# Patient Record
Sex: Female | Born: 1950 | Race: White | Hispanic: No | Marital: Married | Smoking: Former smoker
Health system: Southern US, Community
[De-identification: ages and names within clinical notes are randomized; demographics above are authoritative.]

## PROBLEM LIST (undated history)

## (undated) DIAGNOSIS — T7840XA Allergy, unspecified, initial encounter: Secondary | ICD-10-CM

## (undated) DIAGNOSIS — M949 Disorder of cartilage, unspecified: Secondary | ICD-10-CM

## (undated) DIAGNOSIS — Z8619 Personal history of other infectious and parasitic diseases: Secondary | ICD-10-CM

## (undated) DIAGNOSIS — Z974 Presence of external hearing-aid: Secondary | ICD-10-CM

## (undated) DIAGNOSIS — F17200 Nicotine dependence, unspecified, uncomplicated: Secondary | ICD-10-CM

## (undated) DIAGNOSIS — K219 Gastro-esophageal reflux disease without esophagitis: Secondary | ICD-10-CM

## (undated) DIAGNOSIS — R55 Syncope and collapse: Secondary | ICD-10-CM

## (undated) DIAGNOSIS — L659 Nonscarring hair loss, unspecified: Secondary | ICD-10-CM

## (undated) DIAGNOSIS — J45909 Unspecified asthma, uncomplicated: Secondary | ICD-10-CM

## (undated) DIAGNOSIS — G40802 Other epilepsy, not intractable, without status epilepticus: Secondary | ICD-10-CM

## (undated) DIAGNOSIS — K759 Inflammatory liver disease, unspecified: Secondary | ICD-10-CM

## (undated) DIAGNOSIS — I10 Essential (primary) hypertension: Secondary | ICD-10-CM

## (undated) DIAGNOSIS — H9190 Unspecified hearing loss, unspecified ear: Secondary | ICD-10-CM

## (undated) DIAGNOSIS — B269 Mumps without complication: Secondary | ICD-10-CM

## (undated) DIAGNOSIS — R7309 Other abnormal glucose: Secondary | ICD-10-CM

## (undated) DIAGNOSIS — G4700 Insomnia, unspecified: Secondary | ICD-10-CM

## (undated) DIAGNOSIS — T753XXA Motion sickness, initial encounter: Secondary | ICD-10-CM

## (undated) DIAGNOSIS — R002 Palpitations: Secondary | ICD-10-CM

## (undated) DIAGNOSIS — J309 Allergic rhinitis, unspecified: Secondary | ICD-10-CM

## (undated) DIAGNOSIS — F3289 Other specified depressive episodes: Secondary | ICD-10-CM

## (undated) DIAGNOSIS — B059 Measles without complication: Secondary | ICD-10-CM

## (undated) DIAGNOSIS — C801 Malignant (primary) neoplasm, unspecified: Secondary | ICD-10-CM

## (undated) DIAGNOSIS — M899 Disorder of bone, unspecified: Secondary | ICD-10-CM

## (undated) DIAGNOSIS — M1990 Unspecified osteoarthritis, unspecified site: Secondary | ICD-10-CM

## (undated) DIAGNOSIS — B019 Varicella without complication: Secondary | ICD-10-CM

## (undated) DIAGNOSIS — L299 Pruritus, unspecified: Secondary | ICD-10-CM

## (undated) DIAGNOSIS — D649 Anemia, unspecified: Secondary | ICD-10-CM

## (undated) DIAGNOSIS — F329 Major depressive disorder, single episode, unspecified: Secondary | ICD-10-CM

## (undated) HISTORY — DX: Unspecified asthma, uncomplicated: J45.909

## (undated) HISTORY — DX: Nicotine dependence, unspecified, uncomplicated: F17.200

## (undated) HISTORY — PX: TUBAL LIGATION: SHX77

## (undated) HISTORY — DX: Personal history of other infectious and parasitic diseases: Z86.19

## (undated) HISTORY — DX: Pruritus, unspecified: L29.9

## (undated) HISTORY — DX: Other abnormal glucose: R73.09

## (undated) HISTORY — DX: Disorder of cartilage, unspecified: M94.9

## (undated) HISTORY — DX: Other epilepsy, not intractable, without status epilepticus: G40.802

## (undated) HISTORY — DX: Palpitations: R00.2

## (undated) HISTORY — DX: Insomnia, unspecified: G47.00

## (undated) HISTORY — DX: Unspecified osteoarthritis, unspecified site: M19.90

## (undated) HISTORY — DX: Gastro-esophageal reflux disease without esophagitis: K21.9

## (undated) HISTORY — PX: COSMETIC SURGERY: SHX468

## (undated) HISTORY — DX: Unspecified hearing loss, unspecified ear: H91.90

## (undated) HISTORY — PX: BUNIONECTOMY: SHX129

## (undated) HISTORY — DX: Measles without complication: B05.9

## (undated) HISTORY — DX: Nonscarring hair loss, unspecified: L65.9

## (undated) HISTORY — DX: Major depressive disorder, single episode, unspecified: F32.9

## (undated) HISTORY — DX: Essential (primary) hypertension: I10

## (undated) HISTORY — DX: Syncope and collapse: R55

## (undated) HISTORY — PX: OTHER SURGICAL HISTORY: SHX169

## (undated) HISTORY — DX: Varicella without complication: B01.9

## (undated) HISTORY — DX: Disorder of bone, unspecified: M89.9

## (undated) HISTORY — DX: Allergic rhinitis, unspecified: J30.9

## (undated) HISTORY — DX: Inflammatory liver disease, unspecified: K75.9

## (undated) HISTORY — DX: Mumps without complication: B26.9

## (undated) HISTORY — DX: Other specified depressive episodes: F32.89

## (undated) HISTORY — PX: TONSILLECTOMY AND ADENOIDECTOMY: SUR1326

## (undated) HISTORY — DX: Allergy, unspecified, initial encounter: T78.40XA

---

## 1993-04-20 HISTORY — PX: OTHER SURGICAL HISTORY: SHX169

## 1995-04-21 HISTORY — PX: VAGINAL HYSTERECTOMY: SUR661

## 2001-05-30 ENCOUNTER — Other Ambulatory Visit: Admission: RE | Admit: 2001-05-30 | Discharge: 2001-05-30 | Payer: Self-pay | Admitting: Obstetrics and Gynecology

## 2004-04-20 HISTORY — PX: OTHER SURGICAL HISTORY: SHX169

## 2004-04-20 LAB — HM COLONOSCOPY: HM Colonoscopy: NORMAL

## 2004-08-06 ENCOUNTER — Ambulatory Visit: Payer: Self-pay | Admitting: Otolaryngology

## 2004-10-07 ENCOUNTER — Ambulatory Visit: Payer: Self-pay

## 2005-03-30 ENCOUNTER — Ambulatory Visit: Payer: Self-pay | Admitting: Gastroenterology

## 2005-03-30 HISTORY — PX: ESOPHAGOGASTRODUODENOSCOPY: SHX1529

## 2005-09-24 ENCOUNTER — Ambulatory Visit: Payer: Self-pay

## 2005-09-25 ENCOUNTER — Emergency Department: Payer: Self-pay | Admitting: General Practice

## 2005-09-25 ENCOUNTER — Other Ambulatory Visit: Payer: Self-pay

## 2005-12-25 ENCOUNTER — Ambulatory Visit: Payer: Self-pay | Admitting: Obstetrics and Gynecology

## 2006-09-01 ENCOUNTER — Ambulatory Visit: Payer: Self-pay | Admitting: Gastroenterology

## 2006-10-12 ENCOUNTER — Ambulatory Visit: Payer: Self-pay

## 2006-11-10 ENCOUNTER — Ambulatory Visit: Payer: Self-pay | Admitting: Physician Assistant

## 2007-02-28 ENCOUNTER — Ambulatory Visit: Payer: Self-pay | Admitting: Family Medicine

## 2007-10-13 ENCOUNTER — Ambulatory Visit: Payer: Self-pay

## 2007-12-09 ENCOUNTER — Ambulatory Visit: Payer: Self-pay | Admitting: Family Medicine

## 2008-01-11 ENCOUNTER — Ambulatory Visit: Payer: Self-pay | Admitting: Unknown Physician Specialty

## 2008-03-01 ENCOUNTER — Ambulatory Visit: Payer: Self-pay | Admitting: Family Medicine

## 2008-03-26 ENCOUNTER — Ambulatory Visit: Payer: Self-pay | Admitting: Family Medicine

## 2008-10-23 ENCOUNTER — Ambulatory Visit: Payer: Self-pay | Admitting: Family Medicine

## 2008-12-12 ENCOUNTER — Ambulatory Visit: Payer: Self-pay | Admitting: Family Medicine

## 2009-03-19 ENCOUNTER — Ambulatory Visit: Payer: Self-pay | Admitting: Family Medicine

## 2009-05-21 ENCOUNTER — Ambulatory Visit: Payer: Self-pay | Admitting: Family Medicine

## 2009-05-31 ENCOUNTER — Ambulatory Visit: Payer: Self-pay | Admitting: Family Medicine

## 2009-10-24 ENCOUNTER — Ambulatory Visit: Payer: Self-pay | Admitting: Family Medicine

## 2010-03-04 ENCOUNTER — Other Ambulatory Visit: Payer: Self-pay | Admitting: Family Medicine

## 2010-08-18 LAB — HM PAP SMEAR

## 2010-08-27 ENCOUNTER — Ambulatory Visit: Payer: Self-pay | Admitting: Family Medicine

## 2010-08-28 ENCOUNTER — Ambulatory Visit: Payer: Self-pay | Admitting: Family Medicine

## 2010-10-03 ENCOUNTER — Ambulatory Visit: Payer: Self-pay | Admitting: Orthopedic Surgery

## 2010-10-07 ENCOUNTER — Other Ambulatory Visit: Payer: Self-pay | Admitting: Family Medicine

## 2010-11-13 ENCOUNTER — Ambulatory Visit: Payer: Self-pay | Admitting: Family Medicine

## 2011-09-30 ENCOUNTER — Other Ambulatory Visit: Payer: Self-pay | Admitting: Family Medicine

## 2011-09-30 LAB — CBC WITH DIFFERENTIAL/PLATELET
Basophil %: 0.7 %
Eosinophil %: 1.5 %
HCT: 39.9 % (ref 35.0–47.0)
HGB: 13.5 g/dL (ref 12.0–16.0)
Lymphocyte #: 2.6 10*3/uL (ref 1.0–3.6)
Lymphocyte %: 28.7 %
MCH: 31.9 pg (ref 26.0–34.0)
MCHC: 33.8 g/dL (ref 32.0–36.0)
Monocyte #: 0.6 x10 3/mm (ref 0.2–0.9)
Monocyte %: 6.3 %
Neutrophil #: 5.6 10*3/uL (ref 1.4–6.5)
Neutrophil %: 62.8 %
RBC: 4.23 10*6/uL (ref 3.80–5.20)
RDW: 12.7 % (ref 11.5–14.5)
WBC: 8.9 10*3/uL (ref 3.6–11.0)

## 2011-09-30 LAB — LIPID PANEL
Cholesterol: 158 mg/dL (ref 0–200)
Triglycerides: 117 mg/dL (ref 0–200)
VLDL Cholesterol, Calc: 23 mg/dL (ref 5–40)

## 2011-09-30 LAB — COMPREHENSIVE METABOLIC PANEL
Anion Gap: 7 (ref 7–16)
BUN: 16 mg/dL (ref 7–18)
Bilirubin,Total: 0.5 mg/dL (ref 0.2–1.0)
Chloride: 103 mmol/L (ref 98–107)
Creatinine: 0.65 mg/dL (ref 0.60–1.30)
Osmolality: 277 (ref 275–301)
Potassium: 3.5 mmol/L (ref 3.5–5.1)
SGOT(AST): 27 U/L (ref 15–37)
SGPT (ALT): 34 U/L
Total Protein: 8 g/dL (ref 6.4–8.2)

## 2011-09-30 LAB — FOLATE: Folic Acid: 16.1 ng/mL (ref 3.1–100.0)

## 2011-09-30 LAB — HEMOGLOBIN A1C: Hemoglobin A1C: 5.7 % (ref 4.2–6.3)

## 2011-11-25 ENCOUNTER — Ambulatory Visit: Payer: Self-pay | Admitting: Family Medicine

## 2011-11-25 LAB — HM MAMMOGRAPHY: HM Mammogram: NEGATIVE

## 2012-01-04 ENCOUNTER — Encounter: Payer: Self-pay | Admitting: Sports Medicine

## 2012-01-04 ENCOUNTER — Telehealth: Payer: Self-pay

## 2012-01-04 NOTE — Telephone Encounter (Signed)
PT STATES SHE HAVE AN APPT WITH DR Katrinka Blazing ON October, BUT WOULD LIKE TO SPEAK WITH DR [facility] NURSE AHEAD OF TIME PLEASE CALL [phone]

## 2012-01-05 NOTE — Telephone Encounter (Signed)
Patient has appt scheduled she had insurance physical done and her labs were fine, except her CEA (carcinoembryonic antigen) was high, the level was 4.2.(normal range 0-2.5) She wants to know if this is something she needs to be concerned about now, or if she can discuss with Dr Katrinka Blazing when she comes in October. She has seen Dr Katrinka Blazing for years.

## 2012-01-06 NOTE — Telephone Encounter (Signed)
Please return call --- can wait to discuss CEA levels at visit in October.  KMS

## 2012-01-06 NOTE — Telephone Encounter (Signed)
Gave pt message from Dr Katrinka Blazing and pt agreed to D/W Dr Katrinka Blazing in Oct.

## 2012-01-19 ENCOUNTER — Encounter: Payer: Self-pay | Admitting: Sports Medicine

## 2012-02-08 ENCOUNTER — Encounter: Payer: Self-pay | Admitting: *Deleted

## 2012-02-09 ENCOUNTER — Encounter: Payer: Self-pay | Admitting: Family Medicine

## 2012-02-15 ENCOUNTER — Ambulatory Visit (INDEPENDENT_AMBULATORY_CARE_PROVIDER_SITE_OTHER): Payer: PRIVATE HEALTH INSURANCE | Admitting: Family Medicine

## 2012-02-15 ENCOUNTER — Encounter: Payer: Self-pay | Admitting: Family Medicine

## 2012-02-15 VITALS — BP 142/80 | HR 69 | Temp 98.0°F | Resp 16 | Ht 62.5 in | Wt 172.0 lb

## 2012-02-15 DIAGNOSIS — J45909 Unspecified asthma, uncomplicated: Secondary | ICD-10-CM

## 2012-02-15 DIAGNOSIS — F418 Other specified anxiety disorders: Secondary | ICD-10-CM

## 2012-02-15 DIAGNOSIS — N952 Postmenopausal atrophic vaginitis: Secondary | ICD-10-CM

## 2012-02-15 DIAGNOSIS — E7800 Pure hypercholesterolemia, unspecified: Secondary | ICD-10-CM

## 2012-02-15 DIAGNOSIS — I10 Essential (primary) hypertension: Secondary | ICD-10-CM

## 2012-02-15 DIAGNOSIS — R970 Elevated carcinoembryonic antigen [CEA]: Secondary | ICD-10-CM

## 2012-02-15 DIAGNOSIS — R7309 Other abnormal glucose: Secondary | ICD-10-CM

## 2012-02-15 LAB — COMPREHENSIVE METABOLIC PANEL
ALT: 25 U/L (ref 0–35)
AST: 22 U/L (ref 0–37)
Albumin: 4.4 g/dL (ref 3.5–5.2)
Alkaline Phosphatase: 64 U/L (ref 39–117)
Calcium: 9.7 mg/dL (ref 8.4–10.5)
Chloride: 104 mEq/L (ref 96–112)
Potassium: 3.4 mEq/L — ABNORMAL LOW (ref 3.5–5.3)
Sodium: 139 mEq/L (ref 135–145)
Total Protein: 7.1 g/dL (ref 6.0–8.3)

## 2012-02-15 LAB — CK: Total CK: 215 U/L — ABNORMAL HIGH (ref 7–177)

## 2012-02-15 LAB — LIPID PANEL: LDL Cholesterol: 103 mg/dL — ABNORMAL HIGH (ref 0–99)

## 2012-02-15 LAB — CBC WITH DIFFERENTIAL/PLATELET
Basophils Absolute: 0 10*3/uL (ref 0.0–0.1)
Basophils Relative: 0 % (ref 0–1)
HCT: 39.1 % (ref 36.0–46.0)
Hemoglobin: 13.5 g/dL (ref 12.0–15.0)
Lymphocytes Relative: 28 % (ref 12–46)
MCHC: 34.5 g/dL (ref 30.0–36.0)
Neutro Abs: 6.7 10*3/uL (ref 1.7–7.7)
Neutrophils Relative %: 66 % (ref 43–77)
RDW: 12.5 % (ref 11.5–15.5)
WBC: 10.2 10*3/uL (ref 4.0–10.5)

## 2012-02-15 LAB — HEMOGLOBIN A1C
Hgb A1c MFr Bld: 5.9 % — ABNORMAL HIGH (ref <5.7)
Mean Plasma Glucose: 123 mg/dL — ABNORMAL HIGH (ref <117)

## 2012-02-15 NOTE — Patient Instructions (Addendum)
1. Essential hypertension, benign   2. Asthma   3. Elevated CEA   4. Atrophic vaginitis

## 2012-02-15 NOTE — Progress Notes (Signed)
[address]   [place], [place]  [address]   [phone]  Subjective:    Patient ID: Gina Harper, female    DOB: [birth year], 61 y.o.   MRN: [id number]  HPI This 60 y.o. female presents to establish care and for six month follow-up:  1.  HTN:  Gained six pounds in past six months.  Minimal exercise with recent move.  Eating out a lot.  Moved to [place]; has ellipitical, total gym, treadmill.   BP at work running 117-130/60-70s.  No chest pain, palpitations, shortness of breath, leg swelling.  2.  Depression/Anxiety: moved 12/2011.  Moving is very stressful. Husband drives to [facility].  Coping well.    3.  Elevated CEA:  Having problems with cervical spine.  Undergoing physical therapy.  Intermittent pain; Dr. Renaldo Reel.  S/p cervical spine xray.  Mother had benign tumor of spine. No family history of colon cancer; mother had bladder and lung cancer.  Colonoscopy 2006.  No bloody stools, melena, change in bowel habits, weight loss unintentional, night sweats, nausea, vomiting.  4.  Asthma: needs refill on Albuterol.    5. Hyperlipidemia:  Six month follow-up; due for repeat labs.  Has gained weight since last visit.  6.  Glucose intolerance: on labs six months ago; has gained six pounds since last visit.  Due for repeat labs.   Review of Systems  Constitutional: Negative for fever, chills, diaphoresis, activity change, appetite change and fatigue.  HENT: Negative for congestion, rhinorrhea and postnasal drip.   Respiratory: Positive for wheezing. Negative for cough, shortness of breath and stridor.   Cardiovascular: Negative for chest pain, palpitations and leg swelling.  Gastrointestinal: Negative for nausea, vomiting, abdominal pain, diarrhea, constipation, blood in stool, abdominal distention, anal bleeding and rectal pain.  Neurological: Negative for dizziness, tremors, seizures, syncope, facial asymmetry, speech difficulty, weakness, light-headedness, numbness and headaches.    Psychiatric/Behavioral: Negative for suicidal ideas, sleep disturbance, self-injury and dysphoric mood. The patient is nervous/anxious.         Past Medical History  Diagnosis Date  . Unspecified pruritic disorder   . Unspecified asthma   . Other abnormal glucose   . Disorder of bone and cartilage, unspecified   . Insomnia, unspecified   . Abdominal epilepsy     resctal spasm,syncope  . Esophageal reflux   . Alopecia, unspecified   . Depressive disorder, not elsewhere classified   . Disorder of bone and cartilage, unspecified   . Tobacco use disorder   . Unspecified essential hypertension   . Palpitations   . Unspecified hearing loss   . Allergic rhinitis, cause unspecified   . Acute bronchospasm   . Chicken pox   . Mumps   . Measles     red measles    Past Surgical History  Procedure Date  . Bunionectomy [id number]    bilateral  . Rhinoplasty with septoplasty   . Tubal ligation   . Bladder tact 1995  . Vaginal hysterectomy 1997    menorrhagia ovaries intact  . Tonsillectomy and adenoidectomy     Prior to Admission medications   Medication Sig Start Date End Date Taking? Authorizing Provider  albuterol (PROAIR HFA) 108 (90 BASE) MCG/ACT inhaler Inhale 2 puffs into the lungs every 6 (six) hours as needed.   Yes Historical Provider, MD  aspirin 81 MG tablet Take 81 mg by mouth daily.   Yes Historical Provider, MD  Calcium Carbonate-Vitamin D (CALCIUM 600+D) 600-400 MG-UNIT per tablet Take 1  tablet by mouth 2 (two) times daily.   Yes Historical Provider, MD  Cholecalciferol (VITAMIN D-3) 1000 UNITS CAPS Take by mouth daily.   Yes Historical Provider, MD  estradiol (ESTRACE) 0.1 MG/GM vaginal cream Place 1 g vaginally daily.   Yes Historical Provider, MD  FLUoxetine (PROZAC) 20 MG capsule Take 20 mg by mouth daily.   Yes Historical Provider, MD  hydrochlorothiazide (HYDRODIURIL) 25 MG tablet Take 25 mg by mouth daily.   Yes Historical Provider, MD  lactobacillus  acidophilus (BACID) TABS Take 1 tablet by mouth daily.   Yes Historical Provider, MD  Multiple Vitamin (MULTI-VITAMINS) TABS Take by mouth daily.   Yes Historical Provider, MD  promethazine (PHENERGAN) 25 MG suppository Place 25 mg rectally every 6 (six) hours as needed.   Yes Historical Provider, MD  promethazine (PHENERGAN) 25 MG tablet Take 25 mg by mouth every 6 (six) hours as needed.   Yes Historical Provider, MD  BIOTIN PO Take by mouth daily.    Historical Provider, MD  potassium chloride SA (K-DUR,KLOR-CON) 20 MEQ tablet Take 1 tablet (20 mEq total) by mouth daily. 02/19/12   Ethelda Chick, MD    Allergies  Allergen Reactions  . Augmentin (Amoxicillin-Pot Clavulanate) Rash    History   Social History  . Marital Status: Married    Spouse Name: N/A    Number of Children: 1  . Years of Education: college   Occupational History  . 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   Social History Main Topics  . Smoking status: Former Smoker -- 17 years    Types: Cigarettes  . Smokeless tobacco: Not on file     Comment: Quit 22 years ago  . Alcohol Use: Yes     Comment: moderate white wine 5 glasses per week  . Drug Use: No  . Sexually Active: Not on file   Other Topics Concern  . Not on file   Social History Narrative   Always uses seat belts. Smoke alarm and carbon monoxide detector in the home. Guns stored in locked cabinet. Caffeine use: Coffee, Tea, Carbonated beverages, 3 servings / day. Exercise: Treadmill; elliptical and Zumba about 3 - 4 times weekly. Married x 38 years, happily married no domestic abuse.    Family History  Problem Relation Age of Onset  . Cancer Mother     Lung, Bladder  . Stroke Father   . Heart disease Father   . Colon polyps Father     Objective:   Physical Exam  Nursing note and vitals reviewed. Constitutional: She is oriented to person, place, and time. She appears well-developed and well-nourished. No distress.  HENT:  Mouth/Throat:  Oropharynx is clear and moist.  Eyes: Conjunctivae normal are normal. Pupils are equal, round, and reactive to light.  Neck: Normal range of motion. Neck supple. No JVD present. No thyromegaly present.  Cardiovascular: Normal rate, regular rhythm, normal heart sounds and intact distal pulses.  Exam reveals no gallop and no friction rub.   No murmur heard. Pulmonary/Chest: Effort normal and breath sounds normal. She has no wheezes. She has no rales.  Abdominal: Soft. Bowel sounds are normal. She exhibits no distension and no mass. There is no tenderness. There is no rebound and no guarding.  Lymphadenopathy:    She has no cervical adenopathy.  Neurological: She is alert and oriented to person, place, and time. No cranial nerve deficit. She exhibits normal muscle tone. Coordination normal.  Skin: She is  not diaphoretic.  Psychiatric: She has a normal mood and affect. Her behavior is normal. Judgment and thought content normal.       anxious       Assessment & Plan:   1. Essential hypertension, benign  CBC with Differential, Comprehensive metabolic panel  2. Asthma    3. Elevated CEA  CK  4. Atrophic vaginitis    5. Pure hypercholesterolemia  Lipid panel  6. Other abnormal glucose  Hemoglobin A1c  7. Depression with anxiety   1.  HTN: controlled; no change in medications; obtain labs. 2.  Asthma: stable; refill Albuterol today. 3.  Elevated CEA: New.  Colonoscopy UTD in 2006; asymptomatic.  Do not feel warrants further evaluation due to lack of symptoms and colonoscopy UTD.  If anemic on labs, will warrant referral to GI.  Patient agreeable with plan. 4. Hypercholesterolemia: Uncontrolled; repeat labs today. 5. Glucose Intolerance: stable; repeat labs today.   6.  Anxiety and depression: worsening with recent move; no changes in medication at this time.  If worsens or persists, will recommend increase in Prozac dose.   Meds ordered this encounter  Medications  . aspirin 81 MG tablet     Sig: Take 81 mg by mouth daily.  . potassium chloride SA (K-DUR,KLOR-CON) 20 MEQ tablet    Sig: Take 1 tablet (20 mEq total) by mouth daily.    Dispense:  7 tablet    Refill:  0

## 2012-02-16 ENCOUNTER — Telehealth: Payer: Self-pay

## 2012-02-19 MED ORDER — POTASSIUM CHLORIDE CRYS ER 20 MEQ PO TBCR: 20.0000 meq | EXTENDED_RELEASE_TABLET | Freq: Every day | ORAL | Status: DC

## 2012-04-05 DIAGNOSIS — R970 Elevated carcinoembryonic antigen [CEA]: Secondary | ICD-10-CM | POA: Insufficient documentation

## 2012-04-05 DIAGNOSIS — N952 Postmenopausal atrophic vaginitis: Secondary | ICD-10-CM | POA: Insufficient documentation

## 2012-04-05 DIAGNOSIS — J4530 Mild persistent asthma, uncomplicated: Secondary | ICD-10-CM | POA: Insufficient documentation

## 2012-04-05 DIAGNOSIS — F321 Major depressive disorder, single episode, moderate: Secondary | ICD-10-CM | POA: Insufficient documentation

## 2012-04-05 DIAGNOSIS — F329 Major depressive disorder, single episode, unspecified: Secondary | ICD-10-CM | POA: Insufficient documentation

## 2012-04-05 DIAGNOSIS — J45990 Exercise induced bronchospasm: Secondary | ICD-10-CM | POA: Insufficient documentation

## 2012-04-05 DIAGNOSIS — I10 Essential (primary) hypertension: Secondary | ICD-10-CM | POA: Insufficient documentation

## 2012-04-05 DIAGNOSIS — E7800 Pure hypercholesterolemia, unspecified: Secondary | ICD-10-CM | POA: Insufficient documentation

## 2012-04-05 MED ORDER — ALBUTEROL SULFATE HFA 108 (90 BASE) MCG/ACT IN AERS: 2.0000 | INHALATION_SPRAY | Freq: Four times a day (QID) | RESPIRATORY_TRACT | Status: DC | PRN

## 2012-04-15 NOTE — Progress Notes (Signed)
Reviewed and agree.

## 2012-08-02 ENCOUNTER — Ambulatory Visit: Payer: Self-pay | Admitting: Neurosurgery

## 2012-08-15 ENCOUNTER — Ambulatory Visit (INDEPENDENT_AMBULATORY_CARE_PROVIDER_SITE_OTHER): Payer: 59 | Admitting: Family Medicine

## 2012-08-15 ENCOUNTER — Encounter: Payer: Self-pay | Admitting: Family Medicine

## 2012-08-15 VITALS — BP 132/78 | HR 71 | Temp 97.9°F | Resp 16 | Ht 63.0 in | Wt 169.0 lb

## 2012-08-15 DIAGNOSIS — Z01419 Encounter for gynecological examination (general) (routine) without abnormal findings: Secondary | ICD-10-CM | POA: Insufficient documentation

## 2012-08-15 DIAGNOSIS — F418 Other specified anxiety disorders: Secondary | ICD-10-CM

## 2012-08-15 DIAGNOSIS — K625 Hemorrhage of anus and rectum: Secondary | ICD-10-CM

## 2012-08-15 DIAGNOSIS — Z0000 Encounter for general adult medical examination without abnormal findings: Secondary | ICD-10-CM | POA: Insufficient documentation

## 2012-08-15 DIAGNOSIS — I10 Essential (primary) hypertension: Secondary | ICD-10-CM

## 2012-08-15 LAB — HEMOGLOBIN A1C: Hgb A1c MFr Bld: 5.8 % — ABNORMAL HIGH (ref <5.7)

## 2012-08-15 LAB — POCT URINALYSIS DIPSTICK
Bilirubin, UA: NEGATIVE
Blood, UA: NEGATIVE
Glucose, UA: NEGATIVE
Nitrite, UA: NEGATIVE
Urobilinogen, UA: 0.2

## 2012-08-15 LAB — COMPREHENSIVE METABOLIC PANEL
ALT: 28 U/L (ref 0–35)
Albumin: 4.6 g/dL (ref 3.5–5.2)
CO2: 28 mEq/L (ref 19–32)
Chloride: 102 mEq/L (ref 96–112)
Glucose, Bld: 114 mg/dL — ABNORMAL HIGH (ref 70–99)
Potassium: 3.9 mEq/L (ref 3.5–5.3)
Sodium: 138 mEq/L (ref 135–145)
Total Bilirubin: 0.5 mg/dL (ref 0.3–1.2)
Total Protein: 7 g/dL (ref 6.0–8.3)

## 2012-08-15 LAB — LIPID PANEL
LDL Cholesterol: 98 mg/dL (ref 0–99)
Triglycerides: 97 mg/dL (ref <150)
VLDL: 19 mg/dL (ref 0–40)

## 2012-08-15 LAB — CBC WITH DIFFERENTIAL/PLATELET
Hemoglobin: 13.6 g/dL (ref 12.0–15.0)
Lymphocytes Relative: 25 % (ref 12–46)
Lymphs Abs: 2.5 10*3/uL (ref 0.7–4.0)
MCH: 31.1 pg (ref 26.0–34.0)
Monocytes Relative: 5 % (ref 3–12)
Neutro Abs: 6.9 10*3/uL (ref 1.7–7.7)
Neutrophils Relative %: 68 % (ref 43–77)
Platelets: 243 10*3/uL (ref 150–400)
RBC: 4.38 MIL/uL (ref 3.87–5.11)
WBC: 10.1 10*3/uL (ref 4.0–10.5)

## 2012-08-15 LAB — VITAMIN B12: Vitamin B-12: 472 pg/mL (ref 211–911)

## 2012-08-15 LAB — IRON: Iron: 92 ug/dL (ref 42–145)

## 2012-08-15 LAB — POCT UA - MICROSCOPIC ONLY
Casts, Ur, LPF, POC: NEGATIVE
Yeast, UA: NEGATIVE

## 2012-08-15 LAB — FOLATE: Folate: 17.3 ng/mL

## 2012-08-15 LAB — TSH: TSH: 1.935 u[IU]/mL (ref 0.350–4.500)

## 2012-08-15 MED ORDER — PROMETHAZINE HCL 25 MG RE SUPP: 25.0000 mg | Freq: Four times a day (QID) | RECTAL | Status: DC | PRN

## 2012-08-15 MED ORDER — ALBUTEROL SULFATE HFA 108 (90 BASE) MCG/ACT IN AERS: 2.0000 | INHALATION_SPRAY | Freq: Four times a day (QID) | RESPIRATORY_TRACT | Status: DC | PRN

## 2012-08-15 MED ORDER — FLUOXETINE HCL 20 MG PO CAPS: 20.0000 mg | ORAL_CAPSULE | Freq: Every day | ORAL | Status: DC

## 2012-08-15 MED ORDER — HYDROCORTISONE ACETATE 25 MG RE SUPP: 25.0000 mg | Freq: Two times a day (BID) | RECTAL | Status: DC

## 2012-08-15 MED ORDER — HYDROCHLOROTHIAZIDE 25 MG PO TABS: 25.0000 mg | ORAL_TABLET | Freq: Every day | ORAL | Status: DC

## 2012-08-15 MED ORDER — OMEPRAZOLE 20 MG PO CPDR: 20.0000 mg | DELAYED_RELEASE_CAPSULE | Freq: Every day | ORAL | Status: DC

## 2012-08-15 MED ORDER — PROMETHAZINE HCL 25 MG PO TABS: 25.0000 mg | ORAL_TABLET | Freq: Four times a day (QID) | ORAL | Status: DC | PRN

## 2012-08-15 MED ORDER — ESTRADIOL 0.1 MG/GM VA CREA: TOPICAL_CREAM | VAGINAL | Status: DC

## 2012-08-15 NOTE — Progress Notes (Signed)
  Subjective:    Patient ID: Gina Harper, female    DOB: 1950-07-31, 62 y.o.   MRN: [id number]  HPI    Review of Systems  Constitutional: Positive for diaphoresis.  Gastrointestinal: Positive for anal bleeding.  Musculoskeletal: Positive for back pain and arthralgias.       Objective:   Physical Exam        Assessment & Plan:

## 2012-08-15 NOTE — Assessment & Plan Note (Signed)
Controlled; obtain labs; refills provided. 

## 2012-08-15 NOTE — Assessment & Plan Note (Signed)
New. Scant bleeding on toilet paper associated with itching; consistent with hemorrhoids. Treat with Anusol HC qhs for 1-2 weeks. If persists, refer to GI.

## 2012-08-15 NOTE — Assessment & Plan Note (Signed)
Anticipatory guidance. Discussed current pap smear guidelines in detail.  S/p pelvic and breast examinations in office.  Refer for mammogram.

## 2012-08-15 NOTE — Patient Instructions (Addendum)
1.  CALL INSURANCE REGARDING SHINGLES VACCINE COVERAGE (ZOSTAVAX).

## 2012-08-15 NOTE — Assessment & Plan Note (Signed)
Anticipatory guidance --- weight loss, exercise.  Pap smear UTD 2012.  Mammogram UTD; colonoscopy UTD; hemosure negative.  Immunizations --- advised to contact insurance regarding Zostavax coverage.  Obtain labs. Recommend next bone density scan age 62.

## 2012-08-15 NOTE — Assessment & Plan Note (Signed)
Stable; no change in management.  Refills provided.

## 2012-08-15 NOTE — Progress Notes (Signed)
[address]   [place], [place]  [address]   [phone]   Subjective:    Patient ID: Gina Harper, female    DOB: [birth year], 62 y.o.   MRN: [id number]  HPI This 62 y.o. female presents for CPE.  Last physical 08/20/2011. Pap smear 08/18/2010. Mammogram 11/25/11. [facility]. Colonoscopy 2006.  Repeat in 10 years.   Bone density scan three years ago.   TDAP 02/2009. Pneumovax never. Zostavax never.  History of shingles.   Flu vaccine 2013.  Eye exam this week; +glasses; no glaucoma or cataracts. Dental exam every six months.    Review of Systems  Constitutional: Positive for diaphoresis. Negative for fever, chills, activity change, appetite change, fatigue and unexpected weight change.  HENT: Negative for hearing loss, ear pain, nosebleeds, congestion, sore throat, facial swelling, rhinorrhea, sneezing, drooling, mouth sores, trouble swallowing, neck pain, neck stiffness, dental problem, voice change, postnasal drip, sinus pressure, tinnitus and ear discharge.   Eyes: Negative for photophobia, pain, discharge, redness, itching and visual disturbance.  Respiratory: Negative for apnea, cough, choking, chest tightness, shortness of breath, wheezing and stridor.   Cardiovascular: Negative for chest pain, palpitations and leg swelling.  Gastrointestinal: Positive for anal bleeding. Negative for nausea, vomiting, abdominal pain, diarrhea, constipation, blood in stool, abdominal distention and rectal pain.  Endocrine: Positive for heat intolerance. Negative for cold intolerance, polydipsia, polyphagia and polyuria.  Genitourinary: Negative for urgency, frequency, hematuria, flank pain, vaginal discharge, genital sores, vaginal pain and pelvic pain.  Musculoskeletal: Positive for back pain and arthralgias. Negative for myalgias, joint swelling and gait problem.  Skin: Negative for color change, pallor and rash.  Allergic/Immunologic: Negative for environmental allergies, food allergies and  immunocompromised state.  Neurological: Negative for dizziness, tremors, seizures, syncope, facial asymmetry, speech difficulty, weakness, light-headedness, numbness and headaches.  Hematological: Negative for adenopathy. Does not bruise/bleed easily.  Psychiatric/Behavioral: Negative for suicidal ideas, hallucinations, behavioral problems, confusion, sleep disturbance, self-injury, dysphoric mood, decreased concentration and agitation. The patient is not nervous/anxious and is not hyperactive.     Past Medical History  Diagnosis Date  . Unspecified pruritic disorder   . Unspecified asthma   . Other abnormal glucose   . Insomnia, unspecified   . Abdominal epilepsy     resctal spasm,syncope  . Esophageal reflux   . Alopecia, unspecified   . Depressive disorder, not elsewhere classified   . Disorder of bone and cartilage, unspecified   . Tobacco use disorder   . Unspecified essential hypertension   . Palpitations   . Unspecified hearing loss   . Allergic rhinitis, cause unspecified   . Chicken pox   . Mumps   . Measles     red measles  . Arthritis     Past Surgical History  Procedure Laterality Date  . Bunionectomy  [id number]    bilateral  . Rhinoplasty with septoplasty    . Tubal ligation    . Bladder tact  1995  . Vaginal hysterectomy  1997    menorrhagia ovaries intact  . Tonsillectomy and adenoidectomy    . Cosmetic surgery    . Colonoscopy  04/20/2004    no polyps; normal.  Repeat in 10 years.  [place].    Prior to Admission medications   Medication Sig Start Date End Date Taking? Authorizing Provider  albuterol (PROAIR HFA) 108 (90 BASE) MCG/ACT inhaler Inhale 2 puffs into the lungs every 6 (six) hours as needed. PLEASE PLACE ON HOLD FOR PATIENT. 04/05/12  Yes Ethelda Chick,  MD  BIOTIN PO Take by mouth daily.   Yes Historical Provider, MD  Calcium Carbonate-Vitamin D (CALCIUM 600+D) 600-400 MG-UNIT per tablet Take 1 tablet by mouth 2 (two) times daily.   Yes  Historical Provider, MD  celecoxib (CELEBREX) 200 MG capsule Take 200 mg by mouth 2 (two) times daily.   Yes Historical Provider, MD  Cholecalciferol (VITAMIN D-3) 1000 UNITS CAPS Take by mouth daily.   Yes Historical Provider, MD  estradiol (ESTRACE) 0.1 MG/GM vaginal cream Place 1 g vaginally daily.   Yes Historical Provider, MD  FLUoxetine (PROZAC) 20 MG capsule Take 20 mg by mouth daily.   Yes Historical Provider, MD  hydrochlorothiazide (HYDRODIURIL) 25 MG tablet Take 25 mg by mouth daily.   Yes Historical Provider, MD  lactobacillus acidophilus (BACID) TABS Take 1 tablet by mouth daily.   Yes Historical Provider, MD  Multiple Vitamin (MULTI-VITAMINS) TABS Take by mouth daily.   Yes Historical Provider, MD  promethazine (PHENERGAN) 25 MG suppository Place 25 mg rectally every 6 (six) hours as needed.   Yes Historical Provider, MD  aspirin 81 MG tablet Take 81 mg by mouth daily.    Historical Provider, MD  hydrocortisone (ANUSOL-HC) 25 MG suppository Place 1 suppository (25 mg total) rectally 2 (two) times daily. 08/15/12   Ethelda Chick, MD  omeprazole (PRILOSEC) 20 MG capsule Take 1 capsule (20 mg total) by mouth daily. 08/15/12   Ethelda Chick, MD  potassium chloride SA (K-DUR,KLOR-CON) 20 MEQ tablet Take 1 tablet (20 mEq total) by mouth daily. 02/19/12   Ethelda Chick, MD  promethazine (PHENERGAN) 25 MG tablet Take 25 mg by mouth every 6 (six) hours as needed.    Historical Provider, MD    Allergies  Allergen Reactions  . Meloxicam   . Omnipen (Ampicillin)   . Augmentin (Amoxicillin-Pot Clavulanate) Rash    History   Social History  . Marital Status: Married    Spouse Name: N/A    Number of Children: 1  . 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   Social History Main Topics  . Smoking status: Former Smoker -- 17 years    Types: Cigarettes  . Smokeless tobacco: Not on file     Comment: Quit 22 years ago  . Alcohol Use:  3.0 oz/week    6 drink(s) per week     Comment: moderate white wine 5 glasses per week  . Drug Use: No  . Sexually Active: Yes -- Female partner(s)    Birth Control/ Protection: Post-menopausal, Surgical   Other Topics Concern  . Not on file   Social History Narrative      Marital status:  Married x 38 years, happily married no domestic abuse.      Children:  1 child; 3 grandchildren      Employment:  [employer] x 21 years; happy.  Pre-admission testing.      Tobacco:  Previous smoker.      Alcohol:  2 glasses of wine three nights per week.      Drugs; none      Exercise:  Walks 2-3 times per week for 30 minutes.  Treadmill, elliptical, Zumba.      Seatbelt:  Always uses seat belts.       +Smoke alarm and carbon monoxide detector in the home.       Guns:  Guns stored in locked cabinet.  Caffeine use: Coffee, Tea, Carbonated beverages, 3 servings / day.                  Family History  Problem Relation Age of Onset  . Cancer Mother     Lung, Bladder  . Stroke Father 55    TIAs  . Heart disease Father 68    AMI x 2.  . Colon polyps Father   . Thyroid disease Sister        Objective:   Physical Exam  Nursing note and vitals reviewed. Constitutional: She is oriented to person, place, and time. She appears well-developed and well-nourished. No distress.  HENT:  Head: Normocephalic and atraumatic.  Right Ear: External ear normal.  Left Ear: External ear normal.  Nose: Nose normal.  Mouth/Throat: Oropharynx is clear and moist.  Eyes: Conjunctivae and EOM are normal. Pupils are equal, round, and reactive to light.  Neck: Normal range of motion. Neck supple. No thyromegaly present.  Cardiovascular: Normal rate, regular rhythm, normal heart sounds and intact distal pulses.  Exam reveals no gallop and no friction rub.   No murmur heard. Pulmonary/Chest: Effort normal and breath sounds normal. No respiratory distress. She has no wheezes. She has no rales.  Abdominal: Soft.  Bowel sounds are normal. She exhibits no distension and no mass. There is no tenderness. There is no rebound and no guarding.  Genitourinary: Rectum normal and vagina normal. No breast swelling, tenderness, discharge or bleeding. There is no rash, tenderness or lesion on the right labia. There is no rash, tenderness or lesion on the left labia. Right adnexum displays no mass, no tenderness and no fullness. Left adnexum displays no mass, no tenderness and no fullness.  Musculoskeletal:       Right shoulder: Normal.       Left shoulder: Normal.       Cervical back: Normal.  Lymphadenopathy:    She has no cervical adenopathy.  Neurological: She is alert and oriented to person, place, and time. No cranial nerve deficit. She exhibits normal muscle tone. Coordination normal.  Skin: She is not diaphoretic.  Diffuse sun related changes throughout torso, extremities.  Psychiatric: She has a normal mood and affect. Her behavior is normal. Judgment and thought content normal.   EKG: NSR; no ST changes.     Assessment & Plan:  Routine general medical examination at a health care facility - Plan: POCT urinalysis dipstick, CBC with Differential, Comprehensive metabolic panel, TSH, Hemoglobin A1c, Lipid panel, Vitamin B12, Vitamin D 25 hydroxy, Folate, Iron, EKG 12-Lead, POCT UA - Microscopic Only, IFOBT POC (occult bld, rslt in office)  Rectal bleeding - Plan: hydrocortisone (ANUSOL-HC) 25 MG suppository, IFOBT POC (occult bld, rslt in office)   Meds ordered this encounter  Medications  . celecoxib (CELEBREX) 200 MG capsule    Sig: Take 200 mg by mouth 2 (two) times daily.  . hydrocortisone (ANUSOL-HC) 25 MG suppository    Sig: Place 1 suppository (25 mg total) rectally 2 (two) times daily.    Dispense:  12 suppository    Refill:  0  . omeprazole (PRILOSEC) 20 MG capsule    Sig: Take 1 capsule (20 mg total) by mouth daily.    Dispense:  90 capsule    Refill:  3  . promethazine (PHENERGAN) 25 MG  tablet    Sig: Take 1 tablet (25 mg total) by mouth every 6 (six) hours as needed.    Dispense:  30 tablet    Refill:  1  . promethazine (PHENERGAN) 25 MG suppository    Sig: Place 1 suppository (25 mg total) rectally every 6 (six) hours as needed.    Dispense:  12 each    Refill:  1  . hydrochlorothiazide (HYDRODIURIL) 25 MG tablet    Sig: Take 1 tablet (25 mg total) by mouth daily.    Dispense:  90 tablet    Refill:  3  . FLUoxetine (PROZAC) 20 MG capsule    Sig: Take 1 capsule (20 mg total) by mouth daily.    Dispense:  90 capsule    Refill:  3  . estradiol (ESTRACE) 0.1 MG/GM vaginal cream    Sig: Use as diretected    Dispense:  42.5 g    Refill:  11  . albuterol (PROAIR HFA) 108 (90 BASE) MCG/ACT inhaler    Sig: Inhale 2 puffs into the lungs every 6 (six) hours as needed. PLEASE PLACE ON HOLD FOR PATIENT.    Dispense:  18 g    Refill:  1

## 2012-08-17 ENCOUNTER — Encounter: Payer: Self-pay | Admitting: Family Medicine

## 2012-08-17 NOTE — Telephone Encounter (Signed)
Please review

## 2012-11-03 ENCOUNTER — Telehealth: Payer: Self-pay

## 2012-11-03 MED ORDER — FLUOXETINE HCL 20 MG PO CAPS: 20.0000 mg | ORAL_CAPSULE | Freq: Every day | ORAL | Status: DC

## 2012-11-03 MED ORDER — HYDROCHLOROTHIAZIDE 25 MG PO TABS: 25.0000 mg | ORAL_TABLET | Freq: Every day | ORAL | Status: DC

## 2012-11-03 NOTE — Telephone Encounter (Signed)
She should have 2  refills. / sent since they do not have

## 2012-11-03 NOTE — Telephone Encounter (Signed)
Pt called and is requesting a refill on HCTZ and Fluoxetine. Request it be called in to [facility] on [address] in [place]

## 2012-11-28 ENCOUNTER — Ambulatory Visit: Payer: Self-pay | Admitting: Family Medicine

## 2012-12-01 ENCOUNTER — Telehealth: Payer: Self-pay

## 2012-12-01 MED ORDER — FLUOXETINE HCL 20 MG PO CAPS: 20.0000 mg | ORAL_CAPSULE | Freq: Every day | ORAL | Status: DC

## 2012-12-01 MED ORDER — HYDROCHLOROTHIAZIDE 25 MG PO TABS: 25.0000 mg | ORAL_TABLET | Freq: Every day | ORAL | Status: DC

## 2012-12-01 NOTE — Telephone Encounter (Signed)
See July message - Pt. Switched insurance and her medicine was supposed to start coming through mail order (the insurance is [facility]).  The mail order company said they have never heard back from [place] about the refill.  Now she is out of her medicine.  Please call asap.

## 2012-12-01 NOTE — Telephone Encounter (Signed)
Which mail order? Which meds? She states [facility] and HCTZ and prozac are resent to them, since [facility] will use [facility] not [facility]

## 2013-02-14 ENCOUNTER — Ambulatory Visit: Payer: 59 | Admitting: Family Medicine

## 2013-02-16 ENCOUNTER — Encounter: Payer: Self-pay | Admitting: Family Medicine

## 2013-02-17 ENCOUNTER — Encounter: Payer: Self-pay | Admitting: Family Medicine

## 2013-02-17 ENCOUNTER — Ambulatory Visit (INDEPENDENT_AMBULATORY_CARE_PROVIDER_SITE_OTHER): Payer: 59 | Admitting: Family Medicine

## 2013-02-17 VITALS — BP 138/84 | HR 69 | Temp 97.8°F | Resp 16 | Ht 62.5 in | Wt 176.0 lb

## 2013-02-17 DIAGNOSIS — F418 Other specified anxiety disorders: Secondary | ICD-10-CM

## 2013-02-17 DIAGNOSIS — R7309 Other abnormal glucose: Secondary | ICD-10-CM

## 2013-02-17 DIAGNOSIS — I10 Essential (primary) hypertension: Secondary | ICD-10-CM

## 2013-02-17 DIAGNOSIS — E669 Obesity, unspecified: Secondary | ICD-10-CM

## 2013-02-17 DIAGNOSIS — F341 Dysthymic disorder: Secondary | ICD-10-CM

## 2013-02-17 DIAGNOSIS — Z23 Encounter for immunization: Secondary | ICD-10-CM

## 2013-02-17 LAB — CBC WITH DIFFERENTIAL/PLATELET
Basophils Absolute: 0 10*3/uL (ref 0.0–0.1)
Basophils Relative: 1 % (ref 0–1)
Eosinophils Absolute: 0.2 10*3/uL (ref 0.0–0.7)
Eosinophils Relative: 2 % (ref 0–5)
HCT: 39.2 % (ref 36.0–46.0)
Lymphocytes Relative: 29 % (ref 12–46)
MCH: 30.8 pg (ref 26.0–34.0)
MCHC: 34.4 g/dL (ref 30.0–36.0)
MCV: 89.3 fL (ref 78.0–100.0)
Monocytes Absolute: 0.5 10*3/uL (ref 0.1–1.0)
Platelets: 234 10*3/uL (ref 150–400)
RDW: 13.1 % (ref 11.5–15.5)

## 2013-02-17 LAB — COMPREHENSIVE METABOLIC PANEL
AST: 28 U/L (ref 0–37)
BUN: 15 mg/dL (ref 6–23)
Calcium: 9.3 mg/dL (ref 8.4–10.5)
Chloride: 104 mEq/L (ref 96–112)
Creat: 0.65 mg/dL (ref 0.50–1.10)
Total Bilirubin: 0.5 mg/dL (ref 0.3–1.2)

## 2013-02-17 MED ORDER — ZOSTER VACCINE LIVE 19400 UNT/0.65ML ~~LOC~~ SOLR: 0.6500 mL | Freq: Once | SUBCUTANEOUS | Status: DC

## 2013-02-17 MED ORDER — ALBUTEROL SULFATE HFA 108 (90 BASE) MCG/ACT IN AERS: 2.0000 | INHALATION_SPRAY | Freq: Four times a day (QID) | RESPIRATORY_TRACT | Status: DC | PRN

## 2013-02-17 NOTE — Progress Notes (Signed)
Subjective:    Patient ID: Gina Harper, female    DOB: 1950/11/03, 62 y.o.   MRN: [id number]  HPI Good 6 months. Retired in June. Just returned from 40 year anniversary trip to [place].   Enjoying retirement and is staying busy.   Weight going up. Was going down. Interested in trying [place]. Had neck and shoulder problems related to work in the spring. Saw neurosurgeon and neck pain resolved. Was restricted in arm movements with shoulder pain.  Blood pressure at home running 130-170s/80s. Rarely to 170, usually when excited. Exercising at home and walking most days.   No chest pain, no SOB. Occasionally uses inhaler. Will have periodic laryngospasm, triggered by cough or interrupted breathing.  Came off Prozac. Twin sister thinks she needs to go back on for being "mean." Patient thinks she is ok off Prozac. Has difficulty sleeping and crazy dreams. No improvement in dreams after discontinuing Prozac.   Has history of osteopenia. Wishes to consider biannual screening.  Bowels without change.  Review of Systems  Constitutional: Negative for fever, chills, diaphoresis and fatigue.  Respiratory: Positive for cough and wheezing. Negative for shortness of breath and stridor.   Cardiovascular: Negative for chest pain, palpitations and leg swelling.  Gastrointestinal: Negative for nausea, vomiting, abdominal pain, diarrhea, constipation, blood in stool, anal bleeding and rectal pain.  Skin: Positive for rash.  Neurological: Negative for dizziness, tremors, seizures, syncope, facial asymmetry, speech difficulty, weakness, light-headedness, numbness and headaches.  Psychiatric/Behavioral: Negative for suicidal ideas, sleep disturbance, self-injury and dysphoric mood. The patient is not nervous/anxious.    Past Medical History  Diagnosis Date  . Unspecified pruritic disorder   . Unspecified asthma(493.90)   . Other abnormal glucose   . Insomnia, unspecified   . Abdominal epilepsy     resctal spasm,syncope  . Esophageal reflux   . Alopecia, unspecified   . Depressive disorder, not elsewhere classified   . Disorder of bone and cartilage, unspecified   . Tobacco use disorder   . Unspecified essential hypertension   . Palpitations   . Unspecified hearing loss   . Allergic rhinitis, cause unspecified   . Chicken pox   . Mumps   . Measles     red measles  . Arthritis    Past Surgical History  Procedure Laterality Date  . Bunionectomy  [id number]    bilateral  . Rhinoplasty with septoplasty    . Tubal ligation    . Bladder tact  1995  . Vaginal hysterectomy  1997    menorrhagia ovaries intact  . Tonsillectomy and adenoidectomy    . Cosmetic surgery    . Colonoscopy  04/20/2004    no polyps; normal.  Repeat in 10 years.  [place].   Allergies  Allergen Reactions  . Meloxicam   . Omnipen [Ampicillin]   . Augmentin [Amoxicillin-Pot Clavulanate] Rash   Current Outpatient Prescriptions on File Prior to Visit  Medication Sig Dispense Refill  . aspirin 81 MG tablet Take 81 mg by mouth daily.      Marland Kitchen BIOTIN PO Take by mouth daily.      . Calcium Carbonate-Vitamin D (CALCIUM 600+D) 600-400 MG-UNIT per tablet Take 1 tablet by mouth 2 (two) times daily.      . Cholecalciferol (VITAMIN D-3) 1000 UNITS CAPS Take by mouth daily.      Marland Kitchen FLUoxetine (PROZAC) 20 MG capsule Take 1 capsule (20 mg total) by mouth daily.  90 capsule  2  . hydrochlorothiazide (HYDRODIURIL) 25 MG  tablet Take 1 tablet (25 mg total) by mouth daily.  90 tablet  2  . lactobacillus acidophilus (BACID) TABS Take 1 tablet by mouth daily.      . Multiple Vitamin (MULTI-VITAMINS) TABS Take by mouth daily.      Marland Kitchen omeprazole (PRILOSEC) 20 MG capsule Take 1 capsule (20 mg total) by mouth daily.  90 capsule  3  . promethazine (PHENERGAN) 25 MG suppository Place 1 suppository (25 mg total) rectally every 6 (six) hours as needed.  12 each  1  . promethazine (PHENERGAN) 25 MG tablet Take 1 tablet (25 mg total) by  mouth every 6 (six) hours as needed.  30 tablet  1  . celecoxib (CELEBREX) 200 MG capsule Take 200 mg by mouth 2 (two) times daily.      Marland Kitchen estradiol (ESTRACE) 0.1 MG/GM vaginal cream Use as diretected  42.5 g  11  . hydrocortisone (ANUSOL-HC) 25 MG suppository Place 1 suppository (25 mg total) rectally 2 (two) times daily.  12 suppository  0  . potassium chloride SA (K-DUR,KLOR-CON) 20 MEQ tablet Take 1 tablet (20 mEq total) by mouth daily.  7 tablet  0   No current facility-administered medications on file prior to visit.   History   Social History  . Marital Status: Married    Spouse Name: N/A    Number of Children: 1  . Years of Education: college   Occupational History  . 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   Social History Main Topics  . Smoking status: Former Smoker -- 17 years    Types: Cigarettes  . Smokeless tobacco: Not on file     Comment: Quit 22 years ago  . Alcohol Use: 3.0 oz/week    6 drink(s) per week     Comment: moderate white wine 5 glasses per week  . Drug Use: No  . Sexual Activity: Yes    Partners: Male    Birth Control/ Protection: Post-menopausal, Surgical   Other Topics Concern  . Not on file   Social History Narrative      Marital status:  Married x 38 years, happily married no domestic abuse.      Children:  1 child; 3 grandchildren      Employment:  [employer] x 21 years; happy.  Pre-admission testing.      Tobacco:  Previous smoker.      Alcohol:  2 glasses of wine three nights per week.      Drugs; none      Exercise:  Walks 2-3 times per week for 30 minutes.  Treadmill, elliptical, Zumba.      Seatbelt:  Always uses seat belts.       +Smoke alarm and carbon monoxide detector in the home.       Guns:  Guns stored in locked cabinet.       Caffeine use: Coffee, Tea, Carbonated beverages, 3 servings / day.                     Objective:   Physical Exam  Nursing note and vitals reviewed. Constitutional: She is oriented to person, place,  and time. She appears well-developed and well-nourished. No distress.  HENT:  Head: Normocephalic and atraumatic.  Right Ear: External ear normal.  Left Ear: External ear normal.  Nose: Nose normal.  Mouth/Throat: Oropharynx is clear and moist.  Eyes: Conjunctivae are normal. Pupils are equal, round, and reactive to light. Right eye exhibits no  discharge. Left eye exhibits no discharge.  Neck: Normal range of motion. Neck supple. No JVD present. No thyromegaly present.  Cardiovascular: Normal rate, regular rhythm, normal heart sounds and intact distal pulses.  Exam reveals no gallop and no friction rub.   No murmur heard. Pulmonary/Chest: Effort normal and breath sounds normal. She has no wheezes. She has no rales.  Abdominal: Soft. Bowel sounds are normal. She exhibits no distension and no mass. There is no tenderness. There is no rebound and no guarding.  Musculoskeletal: Normal range of motion.  Lymphadenopathy:    She has no cervical adenopathy.  Neurological: She is alert and oriented to person, place, and time.  Skin: Skin is warm and dry. She is not diaphoretic.  Psychiatric: She has a normal mood and affect. Her behavior is normal. Judgment and thought content normal.   FLU VACCINE ADMINISTERED.    Assessment & Plan:  Essential hypertension, benign - Plan: CBC with Differential, Comprehensive metabolic panel  Other abnormal glucose - Plan: CBC with Differential, Comprehensive metabolic panel, Hemoglobin A1c  Need for prophylactic vaccination and inoculation against influenza - Plan: Flu Vaccine QUAD 36+ mos IM  Depression with anxiety  Obesity, unspecified  1. HTN: controlled; obtain labs; continue current medication. 2.  Glucose Intolerance: stable; obtain labs; recommend weight loss, exercise, dietary modification. 3.  Depression with anxiety: moderately stable off of medication; continue to monitor closely and can restart Prozac if warranted. 4.  Obesity: worsening;  recommend increase in exercise.   5.  S/p flu vaccine. 6. Asthma: stable; refill of Albuterol provided. 7. Need for Zostavax: rx for Zostavax provided; advised to check with insurance regarding coverage and location of administration.  Meds ordered this encounter  Medications  . zoster vaccine live, PF, (ZOSTAVAX) 47829 UNT/0.65ML injection    Sig: Inject 19,400 Units into the skin once.    Dispense:  1 each    Refill:  0  . albuterol (PROAIR HFA) 108 (90 BASE) MCG/ACT inhaler    Sig: Inhale 2 puffs into the lungs every 6 (six) hours as needed. Dispense:  3 inhalers    Dispense:  18 g    Refill:  1   Nilda Simmer, M.D.  [facility] [address] [place], [place]  [address] [phone] phone [phone] fax

## 2013-03-27 ENCOUNTER — Telehealth: Payer: Self-pay

## 2013-03-27 DIAGNOSIS — Z13820 Encounter for screening for osteoporosis: Secondary | ICD-10-CM

## 2013-03-27 NOTE — Telephone Encounter (Signed)
Pended please advise.  

## 2013-03-27 NOTE — Telephone Encounter (Signed)
Dr. Katrinka Blazing - Pt said you had discussed a bone density test.  She said she has been slack about getting around to setting it up, but when she finally did they told her they did not have an order for it.  Can we send that in for her?  Says you had told her it could be done in [place], but she didn't remember the name of the place.  Please call [phone]

## 2013-03-27 NOTE — Telephone Encounter (Signed)
Order placed; patient should be notified in upcoming 1-2 weeks regarding appointment time.

## 2013-03-27 NOTE — Telephone Encounter (Signed)
appt is scheduled in April, for CPE. I did call patient to advise of the order being signed.

## 2013-03-29 ENCOUNTER — Telehealth: Payer: Self-pay | Admitting: Radiology

## 2013-03-29 DIAGNOSIS — E2839 Other primary ovarian failure: Secondary | ICD-10-CM

## 2013-03-29 NOTE — Telephone Encounter (Signed)
Screening for osteoporosis can not be used as a diagnosis for the bone density study. Please advise.

## 2013-03-29 NOTE — Telephone Encounter (Signed)
No but estrogen deficiency will work

## 2013-03-29 NOTE — Telephone Encounter (Signed)
Great!

## 2013-03-29 NOTE — Telephone Encounter (Signed)
Will menopausal state work?

## 2013-08-15 ENCOUNTER — Encounter: Payer: Self-pay | Admitting: Family Medicine

## 2013-08-15 ENCOUNTER — Ambulatory Visit (INDEPENDENT_AMBULATORY_CARE_PROVIDER_SITE_OTHER): Payer: 59 | Admitting: Family Medicine

## 2013-08-15 VITALS — BP 140/86 | HR 59 | Temp 97.8°F | Resp 16 | Ht 62.5 in | Wt 170.4 lb

## 2013-08-15 DIAGNOSIS — Z1211 Encounter for screening for malignant neoplasm of colon: Secondary | ICD-10-CM

## 2013-08-15 DIAGNOSIS — Z0000 Encounter for general adult medical examination without abnormal findings: Secondary | ICD-10-CM

## 2013-08-15 DIAGNOSIS — R7309 Other abnormal glucose: Secondary | ICD-10-CM

## 2013-08-15 DIAGNOSIS — M8580 Other specified disorders of bone density and structure, unspecified site: Secondary | ICD-10-CM

## 2013-08-15 DIAGNOSIS — I10 Essential (primary) hypertension: Secondary | ICD-10-CM

## 2013-08-15 DIAGNOSIS — Z01419 Encounter for gynecological examination (general) (routine) without abnormal findings: Secondary | ICD-10-CM

## 2013-08-15 LAB — HEMOGLOBIN A1C
Hgb A1c MFr Bld: 5.9 % — ABNORMAL HIGH (ref <5.7)
Mean Plasma Glucose: 123 mg/dL — ABNORMAL HIGH (ref <117)

## 2013-08-15 LAB — TSH: TSH: 2.095 u[IU]/mL (ref 0.350–4.500)

## 2013-08-15 LAB — CBC WITH DIFFERENTIAL/PLATELET
BASOS ABS: 0.1 10*3/uL (ref 0.0–0.1)
Basophils Relative: 1 % (ref 0–1)
EOS ABS: 0.2 10*3/uL (ref 0.0–0.7)
Eosinophils Relative: 2 % (ref 0–5)
HCT: 38.4 % (ref 36.0–46.0)
Hemoglobin: 13.2 g/dL (ref 12.0–15.0)
LYMPHS ABS: 2.6 10*3/uL (ref 0.7–4.0)
Lymphocytes Relative: 30 % (ref 12–46)
MCH: 31 pg (ref 26.0–34.0)
MCHC: 34.4 g/dL (ref 30.0–36.0)
MCV: 90.1 fL (ref 78.0–100.0)
Monocytes Absolute: 0.5 10*3/uL (ref 0.1–1.0)
Monocytes Relative: 6 % (ref 3–12)
NEUTROS PCT: 61 % (ref 43–77)
Neutro Abs: 5.4 10*3/uL (ref 1.7–7.7)
PLATELETS: 246 10*3/uL (ref 150–400)
RBC: 4.26 MIL/uL (ref 3.87–5.11)
RDW: 13.3 % (ref 11.5–15.5)
WBC: 8.8 10*3/uL (ref 4.0–10.5)

## 2013-08-15 LAB — POCT URINALYSIS DIPSTICK
Bilirubin, UA: NEGATIVE
Blood, UA: NEGATIVE
Glucose, UA: NEGATIVE
Ketones, UA: NEGATIVE
LEUKOCYTES UA: NEGATIVE
Nitrite, UA: NEGATIVE
PROTEIN UA: NEGATIVE
Spec Grav, UA: 1.02
Urobilinogen, UA: 0.2
pH, UA: 7

## 2013-08-15 LAB — COMPLETE METABOLIC PANEL WITH GFR
ALT: 30 U/L (ref 0–35)
AST: 24 U/L (ref 0–37)
Albumin: 4.6 g/dL (ref 3.5–5.2)
Alkaline Phosphatase: 63 U/L (ref 39–117)
BUN: 17 mg/dL (ref 6–23)
CO2: 30 meq/L (ref 19–32)
Calcium: 9.6 mg/dL (ref 8.4–10.5)
Chloride: 101 mEq/L (ref 96–112)
Creat: 0.7 mg/dL (ref 0.50–1.10)
GFR, Est African American: >89 mL/min
GFR, Est Non African American: >89 mL/min
Glucose, Bld: 100 mg/dL — ABNORMAL HIGH (ref 70–99)
Potassium: 3.6 mEq/L (ref 3.5–5.3)
SODIUM: 139 meq/L (ref 135–145)
TOTAL PROTEIN: 7.1 g/dL (ref 6.0–8.3)
Total Bilirubin: 0.6 mg/dL (ref 0.2–1.2)

## 2013-08-15 LAB — LIPID PANEL
CHOL/HDL RATIO: 3.3 ratio
CHOLESTEROL: 163 mg/dL (ref 0–200)
HDL: 49 mg/dL (ref >39)
LDL Cholesterol: 94 mg/dL (ref 0–99)
TRIGLYCERIDES: 102 mg/dL (ref <150)
VLDL: 20 mg/dL (ref 0–40)

## 2013-08-15 LAB — IFOBT (OCCULT BLOOD): IFOBT: NEGATIVE

## 2013-08-15 MED ORDER — LISINOPRIL 5 MG PO TABS: 5.0000 mg | ORAL_TABLET | Freq: Every day | ORAL | Status: DC

## 2013-08-15 NOTE — Progress Notes (Signed)
Subjective:    Patient ID: Gina Harper, female    DOB: 06-29-1950, 63 y.o.   MRN: [id number] This chart was scribed for Wardell Honour, MD by Anastasia Pall, ED Scribe. This patient was seen in room 28 and the patient's care was started at 10:04 AM.  08/15/2013  Annual Exam and Medication Refill Last visit pt's glucose was 118. One liver function test was slightly elevated. Will repeat today.   Her last physical was 08/15/12.  Pap smear 2013.  Mammogram 08/14.  Colonoscopy 2006 with normal results. Repeat in 10 years.  Tdap 2010.  Pneumovax never.  Zoster vaccination 2014.  Flu 10/14.  Bone density never. Pt reports calling hospital but they said the order never arrived.  Pt reports having eye exam coming up with Dr. Tobe Sos.  She reports going to dentist every 6 months.  Pt reports seeing Dermatologist Aubery Lapping, MD regularly.   Pt has lost 6 lbs since last visit.  HPI Gina Harper is a 63 y.o. female who presents to the [facility] for an annual exam and medication refill.   Pt reports feeling well, denies any complications since her last visit. She exercises 3 times a week, which includes the treadmill, elliptical, and weight circuit. She denies surgeries in the last year. She has h/o hysterectomy for heavy periods and h/o tubal ligation.   She reports her sister is doing well, has not had any complications. Sister is on thyroid medication. She reports her brothers are without health complications. She has been happily married for 40 years, without abuse. She denies any new grandchildren, 3 total. She retired 09/2012.   She drinks EtOH 5-8x a week. She has 2 glasses of wine every weekend. She confirms allergies to Meloxicam, Ampicillin, and Augmentin.   Pt checks her BP at home, states it runs around 130's/upper 80's. She denies her pressure getting into the 90's since retiring.   She reports decreased hearing in her bilateral ears. She has trouble hearing with various  media including movies, music, etc. She states her husband will often ask her if she heard something to which she denies. She reports occasional headaches, but states they are probably from allergies. She denies taking her allergy medication every day. She reports noticing palpitations in the evenings. She reports going to Urgent Care for flu like symptoms 01/15, but denies recent symptoms. She reports LE swelling with a recent flight that subsided 2 hours after flying. She denies blurred vision, chest pain, SOB, back pain, and any other associated symptoms.   Review of Systems  Constitutional: Negative.  Negative for fever, chills, diaphoresis, activity change, appetite change, fatigue and unexpected weight change.  HENT: Positive for hearing loss (decreasing hearing in bilateral ears) and tinnitus (infrequent). Negative for congestion, dental problem, drooling, ear discharge, ear pain, facial swelling, mouth sores, nosebleeds, postnasal drip, rhinorrhea, sinus pressure, sneezing, sore throat, trouble swallowing and voice change.   Eyes: Negative for photophobia, pain, discharge, redness, itching and visual disturbance.  Respiratory: Negative for apnea, cough, choking, chest tightness, shortness of breath, wheezing and stridor.   Cardiovascular: Positive for palpitations (at night). Negative for chest pain and leg swelling.  Gastrointestinal: Negative for nausea, vomiting, abdominal pain, diarrhea, constipation, blood in stool, abdominal distention, anal bleeding and rectal pain.  Endocrine: Negative for cold intolerance, heat intolerance, polydipsia, polyphagia and polyuria.  Genitourinary: Negative for dysuria, urgency, frequency, hematuria, flank pain, decreased urine volume, vaginal bleeding, vaginal discharge, enuresis, difficulty urinating, genital sores, vaginal pain,  menstrual problem, pelvic pain and dyspareunia.  Musculoskeletal: Negative for myalgias, back pain, joint swelling, arthralgias,  gait problem, neck pain and neck stiffness.  Skin: Negative for color change, pallor, rash and wound.  Allergic/Immunologic: Positive for environmental allergies. Negative for food allergies and immunocompromised state.  Neurological: Positive for headaches (possibly from allergies). Negative for dizziness, tremors, seizures, syncope, facial asymmetry, speech difficulty, weakness, light-headedness and numbness.  Hematological: Negative for adenopathy. Does not bruise/bleed easily.  Psychiatric/Behavioral: Negative for suicidal ideas, hallucinations, behavioral problems, confusion, sleep disturbance, self-injury, dysphoric mood, decreased concentration and agitation. The patient is not nervous/anxious and is not hyperactive.    Past Medical History  Diagnosis Date  . Unspecified pruritic disorder   . Unspecified asthma(493.90)   . Other abnormal glucose   . Insomnia, unspecified   . Abdominal epilepsy     resctal spasm,syncope  . Esophageal reflux   . Alopecia, unspecified   . Depressive disorder, not elsewhere classified   . Disorder of bone and cartilage, unspecified   . Tobacco use disorder   . Unspecified essential hypertension   . Palpitations   . Unspecified hearing loss   . Allergic rhinitis, cause unspecified   . Chicken pox   . Mumps   . Measles     red measles  . Arthritis    Allergies  Allergen Reactions  . Lisinopril Cough  . Meloxicam   . Omnipen [Ampicillin]   . Augmentin [Amoxicillin-Pot Clavulanate] Rash   Current Outpatient Prescriptions  Medication Sig Dispense Refill  . BIOTIN PO Take by mouth daily.    . Calcium Carbonate-Vitamin D (CALCIUM 600+D) 600-400 MG-UNIT per tablet Take 1 tablet by mouth 2 (two) times daily.    . Cholecalciferol (VITAMIN D-3) 1000 UNITS CAPS Take by mouth daily.    Marland Kitchen estradiol (ESTRACE) 0.1 MG/GM vaginal cream Use as diretected 42.5 g 11  . lactobacillus acidophilus (BACID) TABS Take 1 tablet by mouth daily.    . Multiple  Vitamin (MULTI-VITAMINS) TABS Take by mouth daily.    . promethazine (PHENERGAN) 25 MG suppository Place 1 suppository (25 mg total) rectally every 6 (six) hours as needed. 12 each 1  . promethazine (PHENERGAN) 25 MG tablet Take 1 tablet (25 mg total) by mouth every 6 (six) hours as needed. 30 tablet 1  . zoster vaccine live, PF, (ZOSTAVAX) 48250 UNT/0.65ML injection Inject 19,400 Units into the skin once. 1 each 0  . albuterol (PROAIR HFA) 108 (90 BASE) MCG/ACT inhaler Inhale 2 puffs into the lungs every 6 (six) hours as needed. Dispense:  3 inhalers 18 g 1  . FLUoxetine (PROZAC) 20 MG capsule Take 1 capsule (20 mg total) by mouth daily. 90 capsule 3  . hydrochlorothiazide (HYDRODIURIL) 25 MG tablet Take 1 tablet (25 mg total) by mouth daily. 90 tablet 3  . lisinopril (PRINIVIL,ZESTRIL) 5 MG tablet Take 1 tablet (5 mg total) by mouth daily. 90 tablet 3  . losartan (COZAAR) 25 MG tablet Take 1 tablet (25 mg total) by mouth daily. 90 tablet 3  . omeprazole (PRILOSEC) 20 MG capsule Take 1 capsule (20 mg total) by mouth daily. 90 capsule 3   No current facility-administered medications for this visit.       Objective:    BP 140/86  Pulse 59  Temp(Src) 97.8 F (36.6 C) (Oral)  Resp 16  Ht 5' 2.5" (1.588 m)  Wt 170 lb 6.4 oz (77.293 kg)  BMI 30.65 kg/m2  SpO2 95% Physical Exam  Constitutional: She is oriented  to person, place, and time. She appears well-developed and well-nourished. No distress.  HENT:  Head: Normocephalic and atraumatic.  Right Ear: Tympanic membrane, external ear and ear canal normal.  Left Ear: Tympanic membrane, external ear and ear canal normal.  Nose: Nose normal.  Mouth/Throat: Uvula is midline, oropharynx is clear and moist and mucous membranes are normal. No oropharyngeal exudate.  Eyes: Conjunctivae and EOM are normal. Pupils are equal, round, and reactive to light.  Neck: Normal range of motion. Neck supple. Carotid bruit is not present. No thyromegaly  present.  Cardiovascular: Normal rate, regular rhythm, normal heart sounds and intact distal pulses.  Exam reveals no gallop and no friction rub.   No murmur heard. Pulmonary/Chest: Effort normal and breath sounds normal. No respiratory distress. She has no wheezes. She has no rales. She exhibits no tenderness. Right breast exhibits no inverted nipple, no mass, no nipple discharge, no skin change and no tenderness. Left breast exhibits no inverted nipple, no mass, no nipple discharge, no skin change and no tenderness. Breasts are symmetrical.  Abdominal: Soft. Bowel sounds are normal. She exhibits no distension and no mass. There is no tenderness. There is no rebound and no guarding.  Genitourinary: Rectum normal and vagina normal. Right adnexum displays no mass, no tenderness and no fullness. Left adnexum displays no mass, no tenderness and no fullness. No erythema, tenderness or bleeding in the vagina. No foreign body around the vagina. No signs of injury around the vagina. No vaginal discharge found.  Musculoskeletal: Normal range of motion. She exhibits no tenderness.  Lymphadenopathy:    She has no cervical adenopathy.  Neurological: She is alert and oriented to person, place, and time. She has normal strength. No cranial nerve deficit or sensory deficit. She exhibits normal muscle tone. Coordination normal.  Skin: Skin is warm and dry. No rash noted. She is not diaphoretic. No erythema. No pallor.  Psychiatric: She has a normal mood and affect. Her behavior is normal.  Nursing note and vitals reviewed.  Results for orders placed or performed in visit on 08/15/13  CBC with Differential  Result Value Ref Range   WBC 8.8 4.0 - 10.5 K/uL   RBC 4.26 3.87 - 5.11 MIL/uL   Hemoglobin 13.2 12.0 - 15.0 g/dL   HCT 38.4 36.0 - 46.0 %   MCV 90.1 78.0 - 100.0 fL   MCH 31.0 26.0 - 34.0 pg   MCHC 34.4 30.0 - 36.0 g/dL   RDW 13.3 11.5 - 15.5 %   Platelets 246 150 - 400 K/uL   Neutrophils Relative % 61  43 - 77 %   Neutro Abs 5.4 1.7 - 7.7 K/uL   Lymphocytes Relative 30 12 - 46 %   Lymphs Abs 2.6 0.7 - 4.0 K/uL   Monocytes Relative 6 3 - 12 %   Monocytes Absolute 0.5 0.1 - 1.0 K/uL   Eosinophils Relative 2 0 - 5 %   Eosinophils Absolute 0.2 0.0 - 0.7 K/uL   Basophils Relative 1 0 - 1 %   Basophils Absolute 0.1 0.0 - 0.1 K/uL   Smear Review Criteria for review not met   COMPLETE METABOLIC PANEL WITH GFR  Result Value Ref Range   Sodium 139 135 - 145 mEq/L   Potassium 3.6 3.5 - 5.3 mEq/L   Chloride 101 96 - 112 mEq/L   CO2 30 19 - 32 mEq/L   Glucose, Bld 100 (H) 70 - 99 mg/dL   BUN 17 6 - 23 mg/dL  Creat 0.70 0.50 - 1.10 mg/dL   Total Bilirubin 0.6 0.2 - 1.2 mg/dL   Alkaline Phosphatase 63 39 - 117 U/L   AST 24 0 - 37 U/L   ALT 30 0 - 35 U/L   Total Protein 7.1 6.0 - 8.3 g/dL   Albumin 4.6 3.5 - 5.2 g/dL   Calcium 9.6 8.4 - 10.5 mg/dL   GFR, Est African American >89 mL/min   GFR, Est Non African American >89 mL/min  TSH  Result Value Ref Range   TSH 2.095 0.350 - 4.500 uIU/mL  Hemoglobin A1c  Result Value Ref Range   Hgb A1c MFr Bld 5.9 (H) <5.7 %   Mean Plasma Glucose 123 (H) <117 mg/dL  Lipid panel  Result Value Ref Range   Cholesterol 163 0 - 200 mg/dL   Triglycerides 102 <150 mg/dL   HDL 49 >39 mg/dL   Total CHOL/HDL Ratio 3.3 Ratio   VLDL 20 0 - 40 mg/dL   LDL Cholesterol 94 0 - 99 mg/dL  Vit D  25 hydroxy (rtn osteoporosis monitoring)  Result Value Ref Range   Vit D, 25-Hydroxy 46 30 - 89 ng/mL  POCT urinalysis dipstick  Result Value Ref Range   Color, UA yellow    Clarity, UA clear    Glucose, UA neg    Bilirubin, UA neg    Ketones, UA neg    Spec Grav, UA 1.020    Blood, UA neg    pH, UA 7.0    Protein, UA neg    Urobilinogen, UA 0.2    Nitrite, UA neg    Leukocytes, UA Negative   IFOBT POC (occult bld, rslt in office)  Result Value Ref Range   IFOBT Negative   Pap IG (Image Guided)  Result Value Ref Range   Specimen adequacy: SEE NOTE     FINAL DIAGNOSIS: SEE NOTE    Cytotechnologist: SEE NOTE       Assessment & Plan:  Routine general medical examination at a health care facility - Plan: CBC with Differential, COMPLETE METABOLIC PANEL WITH GFR, TSH, Hemoglobin A1c, Lipid panel, Vit D  25 hydroxy (rtn osteoporosis monitoring), POCT urinalysis dipstick, EKG 12-Lead  Essential hypertension, benign - Plan: CBC with Differential  Other abnormal glucose  Routine gynecological examination - Plan: Pap IG (Image Guided)  Colon cancer screening - Plan: IFOBT POC (occult bld, rslt in office)  Osteopenia - Plan: DG Bone Density   1. Complete Physical Examination: anticipatory guidance -- exercise, weight loss.  Pap smear obtained; refer for mammogram. Colonoscopy UTD, Immunizations UTD. Refer for bone density scan. 2.  Gynecological exam: pap smear obtained; refer for mammogram; refer for bone density scan. 3.  HTN: moderately controlled; obtain labs, u/a, EKG. Add Lisinopril 12m daily. 4.  Glucose intolerance: stable; obtain labs; recommend weight loss, exercise, low-carb and low-sugar food choices. 5.  Colon cancer screening: hemosure obtained. 6.  Osteopenia: order bone density scan; 3 servings of dairy daily; daily weight bearing exercise.   Meds ordered this encounter  Medications  . lisinopril (PRINIVIL,ZESTRIL) 5 MG tablet    Sig: Take 1 tablet (5 mg total) by mouth daily.    Dispense:  90 tablet    Refill:  3    Return in about 3 months (around 11/14/2013) for recheck blood pressure.   I personally performed the services described in this documentation, which was scribed in my presence.  The recorded information has been reviewed and is accurate.  KReginia Forts  M.D.  [facility] [address] [place],   [address] [phone] phone [phone] fax

## 2013-08-16 LAB — PAP IG (IMAGE GUIDED)

## 2013-08-16 LAB — VITAMIN D 25 HYDROXY (VIT D DEFICIENCY, FRACTURES): Vit D, 25-Hydroxy: 46 ng/mL (ref 30–89)

## 2013-08-17 ENCOUNTER — Encounter: Payer: Self-pay | Admitting: Family Medicine

## 2013-08-31 ENCOUNTER — Other Ambulatory Visit: Payer: Self-pay | Admitting: Family Medicine

## 2013-09-19 ENCOUNTER — Encounter: Payer: Self-pay | Admitting: Family Medicine

## 2013-10-24 ENCOUNTER — Other Ambulatory Visit: Payer: Self-pay | Admitting: Family Medicine

## 2013-11-08 ENCOUNTER — Ambulatory Visit (INDEPENDENT_AMBULATORY_CARE_PROVIDER_SITE_OTHER): Payer: 59 | Admitting: Family Medicine

## 2013-11-08 ENCOUNTER — Encounter: Payer: Self-pay | Admitting: Family Medicine

## 2013-11-08 VITALS — BP 128/74 | HR 61 | Temp 98.0°F | Resp 16 | Ht 62.75 in | Wt 168.8 lb

## 2013-11-08 DIAGNOSIS — R05 Cough: Secondary | ICD-10-CM

## 2013-11-08 DIAGNOSIS — R7303 Prediabetes: Secondary | ICD-10-CM | POA: Insufficient documentation

## 2013-11-08 DIAGNOSIS — L259 Unspecified contact dermatitis, unspecified cause: Secondary | ICD-10-CM

## 2013-11-08 DIAGNOSIS — R059 Cough, unspecified: Secondary | ICD-10-CM

## 2013-11-08 DIAGNOSIS — K219 Gastro-esophageal reflux disease without esophagitis: Secondary | ICD-10-CM

## 2013-11-08 DIAGNOSIS — R202 Paresthesia of skin: Secondary | ICD-10-CM

## 2013-11-08 DIAGNOSIS — L309 Dermatitis, unspecified: Secondary | ICD-10-CM

## 2013-11-08 DIAGNOSIS — I10 Essential (primary) hypertension: Secondary | ICD-10-CM

## 2013-11-08 DIAGNOSIS — R209 Unspecified disturbances of skin sensation: Secondary | ICD-10-CM

## 2013-11-08 DIAGNOSIS — R0789 Other chest pain: Secondary | ICD-10-CM

## 2013-11-08 MED ORDER — FLUOXETINE HCL 20 MG PO CAPS: 20.0000 mg | ORAL_CAPSULE | Freq: Every day | ORAL | Status: DC

## 2013-11-08 MED ORDER — OMEPRAZOLE 20 MG PO CPDR
20.0000 mg | DELAYED_RELEASE_CAPSULE | Freq: Every day | ORAL | Status: DC
Start: 2013-11-08 — End: 2017-01-26

## 2013-11-08 MED ORDER — LOSARTAN POTASSIUM 25 MG PO TABS: 25.0000 mg | ORAL_TABLET | Freq: Every day | ORAL | Status: DC

## 2013-11-08 MED ORDER — HYDROCHLOROTHIAZIDE 25 MG PO TABS: 25.0000 mg | ORAL_TABLET | Freq: Every day | ORAL | Status: DC

## 2013-11-08 MED ORDER — ALBUTEROL SULFATE HFA 108 (90 BASE) MCG/ACT IN AERS: 2.0000 | INHALATION_SPRAY | Freq: Four times a day (QID) | RESPIRATORY_TRACT | Status: DC | PRN

## 2013-11-08 NOTE — Progress Notes (Signed)
Subjective:  This chart was scribed for Reginia Forts, MD by Roxan Diesel, Scribe.  This patient was seen in [facility] and the patient's care was started at 12:10 PM.   Patient ID: Gina Harper, female    DOB: [birth year], 63 y.o.   MRN: [id number]  11/08/2013  Follow-up and Hypertension   HPI  HPI Comments: Gina Harper is a 63 y.o. female who presents to [facility] for a 3 month f/u of hypertension.  At her last visit for a physical she had lisinopril 5mg /day added.    Hypertension: She states her BP has been well-controlled at home, at around 130/80.  However she has developed a "hacky," irritating cough, particularly at bedtime.  She states she coughs so hard that it gives her a sore throat.  She denies any other side effects.    Itching: She reports severe itching on her fingers recently and states she gets "water blisters" after scratching.  She also has some itching on her toes.  She feels she may need an allergy workup.  She saw a dermatologist this week but did not talk to her about these symptoms.  She denies having sweaty hands.  She does not take an antihistamine regularly.  Previously worked for an [employer] and plans to call them for an appointment.  Chest pain: She states that she thinks she has "esophageal spasms" and she has some chest pain which she attributes to this.  It particularly comes on when she drinks coffee on an empty stomach.  The pain never comes on when she is exercising, which she does at the gym 3x/week.  However she also had a brief episode of numbness in her chin which concerned her.  She feels she may need to take her Prilosec more often.  Denies associated SOB, diaphoresis, or nausea.  Leg pain: She also reports occasional "stinging" pain in her legs which she attributes to possible neuropathy.  She states it sometimes feels "like something stung me, and I'll jerk my leg."  This has been ongoing for several months.  It typically comes on when she is  sitting at night, but can come on any time.  She does not have to get up and walk to improve her symptoms.  No associated lower back pain.  Health maintenance: She has finally been successfully scheduled for a bone density scan in August.   Review of Systems  Constitutional: Negative for fever, chills, diaphoresis, activity change, appetite change and fatigue.  Eyes: Negative for visual disturbance.  Respiratory: Positive for cough. Negative for shortness of breath, wheezing and stridor.   Cardiovascular: Positive for chest pain. Negative for palpitations and leg swelling.  Gastrointestinal: Negative for nausea, vomiting, abdominal pain and diarrhea.  Skin: Positive for color change and rash.       Itching  Neurological: Positive for numbness (in chin). Negative for dizziness, tremors, seizures, syncope, facial asymmetry, speech difficulty, weakness, light-headedness and headaches.     Past Medical History  Diagnosis Date  . Unspecified pruritic disorder   . Unspecified asthma(493.90)   . Other abnormal glucose   . Insomnia, unspecified   . Abdominal epilepsy     resctal spasm,syncope  . Esophageal reflux   . Alopecia, unspecified   . Depressive disorder, not elsewhere classified   . Disorder of bone and cartilage, unspecified   . Tobacco use disorder   . Unspecified essential hypertension   . Palpitations   . Unspecified hearing loss   . Allergic  rhinitis, cause unspecified   . Chicken pox   . Mumps   . Measles     red measles  . Arthritis    Past Surgical History  Procedure Laterality Date  . Bunionectomy  [id number]    bilateral  . Rhinoplasty with septoplasty    . Tubal ligation    . Bladder tact  1995  . Vaginal hysterectomy  1997    menorrhagia ovaries intact  . Tonsillectomy and adenoidectomy    . Cosmetic surgery    . Colonoscopy  04/20/2004    no polyps; normal.  Repeat in 10 years.  [place].   Allergies  Allergen Reactions  . Meloxicam   . Omnipen  [Ampicillin]   . Augmentin [Amoxicillin-Pot Clavulanate] Rash   Current Outpatient Prescriptions  Medication Sig Dispense Refill  . albuterol (PROAIR HFA) 108 (90 BASE) MCG/ACT inhaler Inhale 2 puffs into the lungs every 6 (six) hours as needed. Dispense:  3 inhalers  18 g  1  . BIOTIN PO Take by mouth daily.      . Calcium Carbonate-Vitamin D (CALCIUM 600+D) 600-400 MG-UNIT per tablet Take 1 tablet by mouth 2 (two) times daily.      . Cholecalciferol (VITAMIN D-3) 1000 UNITS CAPS Take by mouth daily.      Marland Kitchen estradiol (ESTRACE) 0.1 MG/GM vaginal cream Use as diretected  42.5 g  11  . FLUoxetine (PROZAC) 20 MG capsule Take 1 capsule (20 mg total) by mouth daily.  90 capsule  3  . hydrochlorothiazide (HYDRODIURIL) 25 MG tablet Take 1 tablet (25 mg total) by mouth daily.  90 tablet  3  . lactobacillus acidophilus (BACID) TABS Take 1 tablet by mouth daily.      Marland Kitchen lisinopril (PRINIVIL,ZESTRIL) 5 MG tablet Take 1 tablet (5 mg total) by mouth daily.  90 tablet  3  . Multiple Vitamin (MULTI-VITAMINS) TABS Take by mouth daily.      Marland Kitchen omeprazole (PRILOSEC) 20 MG capsule Take 1 capsule (20 mg total) by mouth daily.  90 capsule  3  . promethazine (PHENERGAN) 25 MG suppository Place 1 suppository (25 mg total) rectally every 6 (six) hours as needed.  12 each  1  . promethazine (PHENERGAN) 25 MG tablet Take 1 tablet (25 mg total) by mouth every 6 (six) hours as needed.  30 tablet  1  . zoster vaccine live, PF, (ZOSTAVAX) 54098 UNT/0.65ML injection Inject 19,400 Units into the skin once.  1 each  0  . losartan (COZAAR) 25 MG tablet Take 1 tablet (25 mg total) by mouth daily.  90 tablet  3   No current facility-administered medications for this visit.   History   Social History  . Marital Status: Married    Spouse Name: N/A    Number of Children: 1  . Years of Education: college   Occupational History  . 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   Social History Main Topics  . Smoking status: Former  Smoker -- 17 years    Types: Cigarettes  . Smokeless tobacco: Not on file     Comment: Quit 22 years ago  . Alcohol Use: 3.0 oz/week    6 drink(s) per week     Comment: moderate white wine 5 glasses per week  . Drug Use: No  . Sexual Activity: Yes    Partners: Male    Birth Control/ Protection: Post-menopausal, Surgical   Other Topics Concern  . Not on file   Social History  Narrative      Marital status:  Married x 40 years, happily married; no domestic abuse.      Children:  1 child; 3 grandchildren local.      Employment:  Retired in 09/2012; [place] x 21 years; happy.  Pre-admission testing.      Tobacco:  Previous smoker.      Alcohol:  2 glasses of wine three nights per week.      Drugs; none      Exercise:  Walks 2-3 times per week for 30 minutes.  Treadmill, elliptical, Zumba.  [employer].      Seatbelt:  Always uses seat belts.       +Smoke alarm and carbon monoxide detector in the home.       Guns:  Guns stored in locked cabinet.       Caffeine use: Coffee, Tea, Carbonated beverages, 3 servings / day.                        Objective:    BP 128/74  Pulse 61  Temp(Src) 98 F (36.7 C) (Oral)  Resp 16  Ht 5' 2.75" (1.594 m)  Wt 168 lb 12.8 oz (76.567 kg)  BMI 30.13 kg/m2  SpO2 98%  Physical Exam  Nursing note and vitals reviewed. Constitutional: She is oriented to person, place, and time. She appears well-developed and well-nourished. No distress.  HENT:  Head: Normocephalic and atraumatic.  Right Ear: External ear normal.  Left Ear: External ear normal.  Nose: Nose normal.  Mouth/Throat: Oropharynx is clear and moist.  Eyes: Conjunctivae and EOM are normal. Pupils are equal, round, and reactive to light.  Neck: Normal range of motion. Neck supple. Carotid bruit is not present. No tracheal deviation present. No thyromegaly present.  Cardiovascular: Normal rate, regular rhythm, normal heart sounds and intact distal pulses.  Exam reveals no gallop and no friction  rub.   No murmur heard. Pulmonary/Chest: Effort normal and breath sounds normal. No respiratory distress. She has no wheezes. She has no rales.  Abdominal: Soft. Bowel sounds are normal. She exhibits no distension and no mass. There is no tenderness. There is no rebound and no guarding.  Musculoskeletal: Normal range of motion.  Lymphadenopathy:    She has no cervical adenopathy.  Neurological: She is alert and oriented to person, place, and time. No cranial nerve deficit.  Skin: Skin is warm and dry. Rash noted. She is not diaphoretic. No erythema. No pallor.  Erythematous scaling rash lateral aspects of several digits B hands; no pustules; one single vesicle present along rash.  Psychiatric: She has a normal mood and affect. Her behavior is normal.    Results for orders placed in visit on 81/19/14  BASIC METABOLIC PANEL WITH GFR      Result Value Ref Range   Sodium 137  135 - 145 mEq/L   Potassium 3.9  3.5 - 5.3 mEq/L   Chloride 101  96 - 112 mEq/L   CO2 26  19 - 32 mEq/L   Glucose, Bld 108 (*) 70 - 99 mg/dL   BUN 14  6 - 23 mg/dL   Creat 0.64  0.50 - 1.10 mg/dL   Calcium 9.6  8.4 - 10.5 mg/dL   GFR, Est African American >89     GFR, Est Non African American >89     EKG: NSR; non-specific ST changes; no acute changes; EKG unchanged from previous EKG.    Assessment & Plan:  1. Essential hypertension, benign   2. Other chest pain   3. Paresthesias   4. Dermatitis   5. Gastroesophageal reflux disease without esophagitis   6. Cough    1. HTN: controlled with addition of Lisinopril but suffering with daily cough; switch Lisinopril to Losartan 25mg  daily; RTC 3 months for follow-up; normal BMET. 2.  Chest pain: atypical; consistent with GERD; no exertional component; treat with daily Prilosec and dietary modification. 3.  Paresthesias B legs:  New. Obtain labs; refer to neurology for NCS; ddx includes PN versus RLS. 4.  Dermatitis B hands and feet:  New.  Suggestive of  dishydrotic eczema; pt declined rx for topical steroid; desires allergy testing and to schedule personally. 5.  GERD: uncontrolled; dietary modification recommended; start daily PPI. 6. Cough:  New with use of Lisinopril; stop Lisinopril and observe.  GERD may also be contributing.  Meds ordered this encounter  Medications  . losartan (COZAAR) 25 MG tablet    Sig: Take 1 tablet (25 mg total) by mouth daily.    Dispense:  90 tablet    Refill:  3  . omeprazole (PRILOSEC) 20 MG capsule    Sig: Take 1 capsule (20 mg total) by mouth daily.    Dispense:  90 capsule    Refill:  3  . hydrochlorothiazide (HYDRODIURIL) 25 MG tablet    Sig: Take 1 tablet (25 mg total) by mouth daily.    Dispense:  90 tablet    Refill:  3  . FLUoxetine (PROZAC) 20 MG capsule    Sig: Take 1 capsule (20 mg total) by mouth daily.    Dispense:  90 capsule    Refill:  3  . albuterol (PROAIR HFA) 108 (90 BASE) MCG/ACT inhaler    Sig: Inhale 2 puffs into the lungs every 6 (six) hours as needed. Dispense:  3 inhalers    Dispense:  18 g    Refill:  1    Return in about 3 months (around 02/08/2014) for recheck high blood pressure.    I personally performed the services described in this documentation, which was scribed in my presence.  The recorded information has been reviewed and is accurate.  Reginia Forts, M.D.  [facility] [address] [place], [place]  [address] [phone] phone [phone] fax

## 2013-11-09 LAB — BASIC METABOLIC PANEL WITH GFR
BUN: 14 mg/dL (ref 6–23)
CALCIUM: 9.6 mg/dL (ref 8.4–10.5)
CO2: 26 meq/L (ref 19–32)
CREATININE: 0.64 mg/dL (ref 0.50–1.10)
Chloride: 101 mEq/L (ref 96–112)
GFR, Est Non African American: >89 mL/min
GLUCOSE: 108 mg/dL — AB (ref 70–99)
Potassium: 3.9 mEq/L (ref 3.5–5.3)
Sodium: 137 mEq/L (ref 135–145)

## 2013-11-15 ENCOUNTER — Ambulatory Visit: Payer: 59 | Admitting: Family Medicine

## 2013-11-30 ENCOUNTER — Ambulatory Visit: Payer: Self-pay | Admitting: Family Medicine

## 2013-11-30 LAB — HM MAMMOGRAPHY: HM Mammogram: NEGATIVE

## 2013-12-15 ENCOUNTER — Encounter: Payer: Self-pay | Admitting: Radiology

## 2014-01-05 ENCOUNTER — Telehealth: Payer: Self-pay

## 2014-01-05 NOTE — Telephone Encounter (Signed)
PT WOULD LIKE TO SPEAK WITH DR. Tamala Julian ABOUT HER BONE DENSITY SCAN RESULTS. SHE SAID THAT SOMEONE CALLED FROM OUR OFFICE AND LEFT A MESSAGE WITH HER SAYING THAT WE HAD THE RESULTS; I FOUND NO RECORD OF THIS BUT I TOLD HER I WOULD LET THE CLINICAL STAFF KNOW OF HER REQUEST.

## 2014-01-08 NOTE — Telephone Encounter (Signed)
Please call pt with bone density results.  Hard copy should be at [facility] (hard copy from [facility]).

## 2014-01-09 NOTE — Telephone Encounter (Signed)
Pt has been advised of results noted on hard copy. Original sent to scan.

## 2014-01-18 ENCOUNTER — Encounter: Payer: Self-pay | Admitting: Family Medicine

## 2014-01-31 ENCOUNTER — Ambulatory Visit (INDEPENDENT_AMBULATORY_CARE_PROVIDER_SITE_OTHER): Payer: 59 | Admitting: Family Medicine

## 2014-01-31 ENCOUNTER — Encounter: Payer: Self-pay | Admitting: Family Medicine

## 2014-01-31 VITALS — BP 134/86 | HR 66 | Temp 98.3°F | Resp 16 | Ht 62.5 in | Wt 172.4 lb

## 2014-01-31 DIAGNOSIS — L309 Dermatitis, unspecified: Secondary | ICD-10-CM | POA: Insufficient documentation

## 2014-01-31 DIAGNOSIS — K219 Gastro-esophageal reflux disease without esophagitis: Secondary | ICD-10-CM

## 2014-01-31 DIAGNOSIS — I10 Essential (primary) hypertension: Secondary | ICD-10-CM

## 2014-01-31 DIAGNOSIS — G629 Polyneuropathy, unspecified: Secondary | ICD-10-CM

## 2014-01-31 DIAGNOSIS — R05 Cough: Secondary | ICD-10-CM

## 2014-01-31 DIAGNOSIS — R059 Cough, unspecified: Secondary | ICD-10-CM

## 2014-01-31 DIAGNOSIS — R011 Cardiac murmur, unspecified: Secondary | ICD-10-CM

## 2014-01-31 DIAGNOSIS — R010 Benign and innocent cardiac murmurs: Secondary | ICD-10-CM

## 2014-01-31 LAB — BASIC METABOLIC PANEL WITH GFR
BUN: 17 mg/dL (ref 6–23)
CALCIUM: 10.1 mg/dL (ref 8.4–10.5)
CO2: 29 meq/L (ref 19–32)
Chloride: 101 mEq/L (ref 96–112)
Creat: 0.71 mg/dL (ref 0.50–1.10)
GFR, Est African American: >89 mL/min
GFR, Est Non African American: >89 mL/min
Glucose, Bld: 109 mg/dL — ABNORMAL HIGH (ref 70–99)
Potassium: 3.8 mEq/L (ref 3.5–5.3)
Sodium: 137 mEq/L (ref 135–145)

## 2014-01-31 NOTE — Progress Notes (Signed)
Subjective:    Patient ID: Gina Harper, female    DOB: 1950/06/14, 63 y.o.   MRN: [id number]  HPI  Gina Harper is presenting for 3 month follow up on HTN. She is checking her BP every other week at home, usually in 124-580'D systolic. She was previously taken off of lisinopril due to cough, changed to losartan, continued HCTZ, reports medication compliance. Exercises 3 times a week, avoids added salt and other salty foods, prepares her own food, tries to eat healthily. Drinks 1-2 glasses of wine per day, no smoking. Denies any other aggravating or relieving factors, no other questions or concerns. ROS below.   Prior to Admission medications   Medication Sig Start Date End Date Taking? Authorizing Provider  albuterol (PROAIR HFA) 108 (90 BASE) MCG/ACT inhaler Inhale 2 puffs into the lungs every 6 (six) hours as needed. Dispense:  3 inhalers 11/08/13  Yes Wardell Honour, MD  Calcium Carbonate-Vitamin D (CALCIUM 600+D) 600-400 MG-UNIT per tablet Take 1 tablet by mouth 2 (two) times daily.   Yes Historical Provider, MD  estradiol (ESTRACE) 0.1 MG/GM vaginal cream Use as diretected 08/15/12  Yes Wardell Honour, MD  FLUoxetine (PROZAC) 20 MG capsule Take 1 capsule (20 mg total) by mouth daily. 11/08/13  Yes Wardell Honour, MD  hydrochlorothiazide (HYDRODIURIL) 25 MG tablet Take 1 tablet (25 mg total) by mouth daily. 11/08/13  Yes Wardell Honour, MD  lactobacillus acidophilus (BACID) TABS Take 1 tablet by mouth daily.   Yes Historical Provider, MD  losartan (COZAAR) 25 MG tablet Take 1 tablet (25 mg total) by mouth daily. 11/08/13  Yes Wardell Honour, MD  Multiple Vitamin (MULTI-VITAMINS) TABS Take by mouth daily.   Yes Historical Provider, MD  omeprazole (PRILOSEC) 20 MG capsule Take 1 capsule (20 mg total) by mouth daily. 11/08/13  Yes Wardell Honour, MD  promethazine (PHENERGAN) 25 MG suppository Place 1 suppository (25 mg total) rectally every 6 (six) hours as needed. 08/15/12  Yes Wardell Honour, MD    promethazine (PHENERGAN) 25 MG tablet Take 1 tablet (25 mg total) by mouth every 6 (six) hours as needed. 08/15/12  Yes Wardell Honour, MD  zoster vaccine live, PF, (ZOSTAVAX) 98338 UNT/0.65ML injection Inject 19,400 Units into the skin once. 02/17/13  Yes Wardell Honour, MD  BIOTIN PO Take by mouth daily.    Historical Provider, MD  Cholecalciferol (VITAMIN D-3) 1000 UNITS CAPS Take by mouth daily.    Historical Provider, MD  lisinopril (PRINIVIL,ZESTRIL) 5 MG tablet Take 1 tablet (5 mg total) by mouth daily. 08/15/13   Wardell Honour, MD    Allergies  Allergen Reactions  . Meloxicam   . Omnipen [Ampicillin]   . Augmentin [Amoxicillin-Pot Clavulanate] Rash    Review of Systems  Constitutional: Negative for fatigue.  Eyes: Negative for visual disturbance.       No blurred vision.  Respiratory: Negative for cough, chest tightness, shortness of breath and wheezing.   Cardiovascular: Negative for chest pain, palpitations and leg swelling.  Skin: Negative for rash.  Allergic/Immunologic:       No angioedema.  Neurological: Negative for dizziness and light-headedness.       Objective:   Physical Exam  Constitutional: She is oriented to person, place, and time. She appears well-developed and well-nourished. No distress.  BP 134/86  Pulse 66  Temp(Src) 98.3 F (36.8 C) (Oral)  Resp 16  Ht 5' 2.5" (1.588 m)  Wt 172 lb 6.4  oz (78.2 kg)  BMI 31.01 kg/m2  SpO2 96%  BP Readings from Last 3 Encounters: 01/31/14 : 134/86 11/08/13 : 128/74 08/15/13 : 140/86  Wt Readings from Last 3 Encounters: 01/31/14 : 172 lb 6.4 oz (78.2 kg) 11/08/13 : 168 lb 12.8 oz (76.567 kg) 08/15/13 : 170 lb 6.4 oz (77.293 kg)   Eyes: Conjunctivae and EOM are normal. Pupils are equal, round, and reactive to light.  Cardiovascular: Normal rate, regular rhythm and intact distal pulses.  Exam reveals no gallop and no friction rub.   Murmur (Grade II/VI systolic ejection murmur best heard in LUSB.)  heard. Pulmonary/Chest: Effort normal and breath sounds normal. No respiratory distress. She has no wheezes. She has no rales.  Musculoskeletal: She exhibits no edema and no tenderness.  Neurological: She is alert and oriented to person, place, and time.  Skin: Skin is warm and dry. No rash noted. She is not diaphoretic.  Psychiatric: She has a normal mood and affect. Her behavior is normal.       Assessment & Plan:   Gina Harper is a 63 y.o. female presenting for 3 month follow up on her HTN. She is doing well on her current regimen, is compliant with medication and diet. Plan for follow up in 7 months for CPE. Otherwise, plan for specific conditions below.  1. Essential hypertension, benign Stable, repeat labs today, continue losartan and HCTZ, diet modifications and exercise - BASIC METABOLIC PANEL WITH GFR  2. Undiagnosed cardiac murmurs Asymptomatic, patient states that she was previously told she had a heart murmur, has never hard cardiac work-up, no diagnostic testing is warranted today, will continue to monitor   Jaynee Eagles, PA-C [facility] [phone] 01/31/2014 11:30 AM

## 2014-01-31 NOTE — Progress Notes (Signed)
History and physical examinations obtained with Rosario Adie, PA-C.  Patient reports persistent itching B hands; s/p dermatology consultation and prescribed tub of medication with persistent itching.  Did not undergo allergy consultation but is taking Zyrtec 10mg  daily with improvement; no associated rash with itching.  No further chest pain but continues to suffer with GERD with drinking coffee on empty stomach; taking PPI PRN.  No chest pain with exercise three days per week.  S/p consultation by Dr. Manuella Ghazi of neurology for neuropathy; no EMG studies performed; recommended supplement (alpha lipoic acid) which patient has been non-compliant with taking daily. No changes to therapy; follow-up six months for CPE.  To obtain flu vaccine after upcoming trip to [facility].  Cough now resolved with cessation of Lisinopril.

## 2014-06-12 ENCOUNTER — Telehealth: Payer: Self-pay

## 2014-06-12 NOTE — Telephone Encounter (Signed)
Dr. Tamala Julian,  Pt called and states that she had chest pain at 5 am this morning and would like to know what to do. I informed the patient to seek emergency care if the pain returns. Please call [place] @ [phone]
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
## 2014-06-14 NOTE — Telephone Encounter (Signed)
Agree with recommendations.  

## 2014-06-19 NOTE — Telephone Encounter (Signed)
Please call patient for follow-up; how is she feeling?  Any more chest pain?

## 2014-06-20 NOTE — Telephone Encounter (Signed)
Noted  

## 2014-06-20 NOTE — Telephone Encounter (Signed)
Spoke with pt, she went to the walk in clinic the next day. She is feeling better and has an appt with a cardiologist, Dr.Tim [facility] February 16th. I advised we would follow up after she sees him.

## 2014-07-04 ENCOUNTER — Ambulatory Visit (INDEPENDENT_AMBULATORY_CARE_PROVIDER_SITE_OTHER): Payer: 59 | Admitting: Cardiovascular Disease

## 2014-07-04 ENCOUNTER — Encounter: Payer: Self-pay | Admitting: Cardiovascular Disease

## 2014-07-04 ENCOUNTER — Ambulatory Visit (INDEPENDENT_AMBULATORY_CARE_PROVIDER_SITE_OTHER)
Admission: RE | Admit: 2014-07-04 | Discharge: 2014-07-04 | Disposition: A | Discharge status: Home / Self Care | Payer: Self-pay | Source: Ambulatory Visit | Attending: Cardiovascular Disease | Admitting: Cardiovascular Disease

## 2014-07-04 VITALS — BP 124/74 | HR 67 | Ht 62.0 in | Wt 173.0 lb

## 2014-07-04 DIAGNOSIS — R079 Chest pain, unspecified: Secondary | ICD-10-CM

## 2014-07-04 DIAGNOSIS — E7800 Pure hypercholesterolemia, unspecified: Secondary | ICD-10-CM

## 2014-07-04 DIAGNOSIS — R0789 Other chest pain: Secondary | ICD-10-CM

## 2014-07-04 DIAGNOSIS — I10 Essential (primary) hypertension: Secondary | ICD-10-CM

## 2014-07-04 DIAGNOSIS — J45909 Unspecified asthma, uncomplicated: Secondary | ICD-10-CM

## 2014-07-04 NOTE — Assessment & Plan Note (Signed)
Atypical chest pain first on the left, now the right. Prior smoking history, no active smoking, no diabetes. Cholesterol well controlled. We have discussed the very [place] options. As she is exercising with no symptoms, less likely ischemia. We have ordered a coronary calcium score for risk stratification. She prefers this over a treadmill study given her previous exercise-induced asthma. If symptoms persist, further testing could be done though a low calcium score would likely indicate musculoskeletal etiology

## 2014-07-04 NOTE — Progress Notes (Signed)
Patient ID: Gina Harper, female    DOB: 07-May-1950, 64 y.o.   MRN: [id number]  HPI Comments: Gina Harper is a 64 year old woman with history of hypertension, smoker for 17 years, family history of coronary disease with father having MI in his 68s who presents for evaluation of chest pain.  She reports at the end of February 2016 at 5 AM she had chest pain on the left described as a squeezing of her heart lasting for 3 seconds. Symptoms resolved without intervention. Since then she has been working out at the gym at least 3 days per week with aerobic and no symptoms. She has had one additional episodes of pain on the right side lasting 2-3 seconds again described as a squeezing. No further symptoms Otherwise she feels well with no complaints. She is a former [employer] scientist and does not think she is having a major cardiac issue She was seen by primary care and was told to be evaluated Denies any radiating neck pain, jaw pain. Stress test many years ago and reports that she had exercise-induced asthma, shortness of breath and was unable to finish the treadmill. As the symptoms are better since then  She denies having any significant cholesterol issues. No diabetes  EKG on today's visit shows normal sinus rhythm with rate 67 bpm, no significant ST or T-wave changes   Allergies  Allergen Reactions  . Cefdinir Hives  . Lisinopril Cough and Other (See Comments)  . Meloxicam Swelling  . Omnipen [Ampicillin] Other (See Comments)  . Augmentin [Amoxicillin-Pot Clavulanate] Rash  . Clarithromycin Rash    Outpatient Encounter Prescriptions as of 07/04/2014  Medication Sig  . albuterol (PROAIR HFA) 108 (90 BASE) MCG/ACT inhaler Inhale 2 puffs into the lungs every 6 (six) hours as needed. Dispense:  3 inhalers  . Calcium Carbonate-Vitamin D (CALCIUM 600+D) 600-400 MG-UNIT per tablet Take 1 tablet by mouth 2 (two) times daily.  Marland Kitchen estradiol (ESTRACE) 0.1 MG/GM vaginal cream Use as diretected  .  FLUoxetine (PROZAC) 20 MG capsule Take 1 capsule (20 mg total) by mouth daily.  . hydrochlorothiazide (HYDRODIURIL) 25 MG tablet Take 1 tablet (25 mg total) by mouth daily.  Marland Kitchen lactobacillus acidophilus (BACID) TABS Take 1 tablet by mouth daily.  Marland Kitchen losartan (COZAAR) 25 MG tablet Take 1 tablet (25 mg total) by mouth daily.  . Multiple Vitamin (MULTI-VITAMINS) TABS Take by mouth daily.  Marland Kitchen omeprazole (PRILOSEC) 20 MG capsule Take 1 capsule (20 mg total) by mouth daily.  . promethazine (PHENERGAN) 25 MG suppository Place 1 suppository (25 mg total) rectally every 6 (six) hours as needed.  . promethazine (PHENERGAN) 25 MG tablet Take 1 tablet (25 mg total) by mouth every 6 (six) hours as needed.  . zoster vaccine live, PF, (ZOSTAVAX) 51025 UNT/0.65ML injection Inject 19,400 Units into the skin once.  . [DISCONTINUED] BIOTIN PO Take by mouth daily.  . [DISCONTINUED] Cholecalciferol (VITAMIN D-3) 1000 UNITS CAPS Take by mouth daily.  . [DISCONTINUED] lisinopril (PRINIVIL,ZESTRIL) 5 MG tablet Take 1 tablet (5 mg total) by mouth daily. (Patient not taking: Reported on 07/04/2014)    Past Medical History  Diagnosis Date  . Unspecified pruritic disorder   . Unspecified asthma(493.90)   . Other abnormal glucose   . Insomnia, unspecified   . Abdominal epilepsy     resctal spasm,syncope  . Esophageal reflux   . Alopecia, unspecified   . Depressive disorder, not elsewhere classified   . Disorder of bone and cartilage, unspecified   .  Tobacco use disorder   . Unspecified essential hypertension   . Palpitations   . Unspecified hearing loss   . Allergic rhinitis, cause unspecified   . Chicken pox   . Mumps   . Measles     red measles  . Arthritis   . Syncope, vasovagal   . History of shingles   . GERD (gastroesophageal reflux disease)   . Hepatitis     Past Surgical History  Procedure Laterality Date  . Bunionectomy  9470-9628    bilateral  . Rhinoplasty with septoplasty    . Tubal  ligation    . Bladder tact  1995  . Vaginal hysterectomy  1997    menorrhagia ovaries intact  . Tonsillectomy and adenoidectomy    . Cosmetic surgery    . Colonoscopy  04/20/2004    no polyps; normal.  Repeat in 10 years.  [place].    Social History  reports that she quit smoking about 26 years ago. Her smoking use included Cigarettes. She has a 17 pack-year smoking history. She does not have any smokeless tobacco history on file. She reports that she drinks about 3.0 oz of alcohol per week. She reports that she does not use illicit drugs.  Family History family history includes Cancer in her mother; Colon polyps in her father; Heart disease (age of onset: 62) in her father; Stroke (age of onset: 43) in her father; Thyroid disease in her sister.   Review of Systems  Constitutional: Negative.   Respiratory: Negative.   Cardiovascular: Negative.   Gastrointestinal: Negative.   Musculoskeletal: Negative.   Skin: Negative.   Neurological: Negative.   Hematological: Negative.   Psychiatric/Behavioral: Negative.   All other systems reviewed and are negative.   BP 124/74 mmHg  Pulse 67  Ht 5\' 2"  (1.575 m)  Wt 173 lb (78.472 kg)  BMI 31.63 kg/m2  Physical Exam  Constitutional: She is oriented to person, place, and time. She appears well-developed and well-nourished.  HENT:  Head: Normocephalic.  Nose: Nose normal.  Mouth/Throat: Oropharynx is clear and moist.  Eyes: Conjunctivae are normal. Pupils are equal, round, and reactive to light.  Neck: Normal range of motion. Neck supple. No JVD present.  Cardiovascular: Normal rate, regular rhythm, S1 normal, S2 normal, normal heart sounds and intact distal pulses.  Exam reveals no gallop and no friction rub.   No murmur heard. Pulmonary/Chest: Effort normal and breath sounds normal. No respiratory distress. She has no wheezes. She has no rales. She exhibits no tenderness.  Abdominal: Soft. Bowel sounds are normal. She exhibits no  distension. There is no tenderness.  Musculoskeletal: Normal range of motion. She exhibits no edema or tenderness.  Lymphadenopathy:    She has no cervical adenopathy.  Neurological: She is alert and oriented to person, place, and time. Coordination normal.  Skin: Skin is warm and dry. No rash noted. No erythema.  Psychiatric: She has a normal mood and affect. Her behavior is normal. Judgment and thought content normal.    Assessment and Plan  Nursing note and vitals reviewed.

## 2014-07-04 NOTE — Assessment & Plan Note (Signed)
Blood pressure is well controlled on today's visit. No changes made to the medications. 

## 2014-07-04 NOTE — Assessment & Plan Note (Signed)
Cholesterol recently well controlled on no medication

## 2014-07-04 NOTE — Patient Instructions (Addendum)
You are doing well. No medication changes were made.  We will set up a coronary calcium score in [place] for chest pain Today @ 4:00, please arrive at 3:45 There is a one time fee of $150.00 due at the time of your procedure  Please call us if you have new issues that need to be addressed before your next appt.

## 2014-07-04 NOTE — Assessment & Plan Note (Signed)
She reports asthma exacerbation has not been a active issue recently. She is exercising, doing regular aerobic activity

## 2014-08-08 LAB — HM PAP SMEAR: HM Pap smear: NORMAL

## 2014-08-20 ENCOUNTER — Encounter: Payer: Self-pay | Admitting: Family Medicine

## 2014-08-21 ENCOUNTER — Encounter: Payer: Self-pay | Admitting: Family Medicine

## 2014-09-07 ENCOUNTER — Encounter: Payer: Self-pay | Admitting: Internal Medicine

## 2014-09-07 ENCOUNTER — Ambulatory Visit (INDEPENDENT_AMBULATORY_CARE_PROVIDER_SITE_OTHER): Payer: 59 | Admitting: Internal Medicine

## 2014-09-07 VITALS — BP 136/81 | HR 56 | Temp 97.8°F | Ht 62.5 in | Wt 174.0 lb

## 2014-09-07 DIAGNOSIS — J45909 Unspecified asthma, uncomplicated: Secondary | ICD-10-CM | POA: Diagnosis not present

## 2014-09-07 DIAGNOSIS — Z1239 Encounter for other screening for malignant neoplasm of breast: Secondary | ICD-10-CM

## 2014-09-07 DIAGNOSIS — G609 Hereditary and idiopathic neuropathy, unspecified: Secondary | ICD-10-CM | POA: Diagnosis not present

## 2014-09-07 DIAGNOSIS — F418 Other specified anxiety disorders: Secondary | ICD-10-CM

## 2014-09-07 DIAGNOSIS — G629 Polyneuropathy, unspecified: Secondary | ICD-10-CM

## 2014-09-07 DIAGNOSIS — E7800 Pure hypercholesterolemia, unspecified: Secondary | ICD-10-CM

## 2014-09-07 DIAGNOSIS — I10 Essential (primary) hypertension: Secondary | ICD-10-CM | POA: Diagnosis not present

## 2014-09-07 LAB — CBC WITH DIFFERENTIAL/PLATELET
Basophils Absolute: 0 10*3/uL (ref 0.0–0.1)
Basophils Relative: 0.5 % (ref 0.0–3.0)
EOS PCT: 1.5 % (ref 0.0–5.0)
Eosinophils Absolute: 0.1 10*3/uL (ref 0.0–0.7)
HEMATOCRIT: 40.4 % (ref 36.0–46.0)
Hemoglobin: 13.9 g/dL (ref 12.0–15.0)
LYMPHS ABS: 2.4 10*3/uL (ref 0.7–4.0)
Lymphocytes Relative: 29.7 % (ref 12.0–46.0)
MCHC: 34.5 g/dL (ref 30.0–36.0)
MCV: 93.5 fl (ref 78.0–100.0)
MONOS PCT: 5.6 % (ref 3.0–12.0)
Monocytes Absolute: 0.5 10*3/uL (ref 0.1–1.0)
NEUTROS PCT: 62.7 % (ref 43.0–77.0)
Neutro Abs: 5.1 10*3/uL (ref 1.4–7.7)
Platelets: 229 10*3/uL (ref 150.0–400.0)
RBC: 4.32 Mil/uL (ref 3.87–5.11)
RDW: 12.9 % (ref 11.5–15.5)
WBC: 8.1 10*3/uL (ref 4.0–10.5)

## 2014-09-07 LAB — LIPID PANEL
Cholesterol: 172 mg/dL (ref 0–200)
HDL: 49.7 mg/dL (ref >39.00)
LDL Cholesterol: 107 mg/dL — ABNORMAL HIGH (ref 0–99)
NONHDL: 122.3
Total CHOL/HDL Ratio: 3
Triglycerides: 77 mg/dL (ref 0.0–149.0)
VLDL: 15.4 mg/dL (ref 0.0–40.0)

## 2014-09-07 LAB — COMPREHENSIVE METABOLIC PANEL
ALBUMIN: 4.5 g/dL (ref 3.5–5.2)
ALT: 44 U/L — ABNORMAL HIGH (ref 0–35)
AST: 32 U/L (ref 0–37)
Alkaline Phosphatase: 69 U/L (ref 39–117)
BILIRUBIN TOTAL: 0.5 mg/dL (ref 0.2–1.2)
BUN: 17 mg/dL (ref 6–23)
CO2: 29 mEq/L (ref 19–32)
Calcium: 9.8 mg/dL (ref 8.4–10.5)
Chloride: 104 mEq/L (ref 96–112)
Creatinine, Ser: 0.77 mg/dL (ref 0.40–1.20)
GFR: 80.04 mL/min (ref >60.00)
Glucose, Bld: 113 mg/dL — ABNORMAL HIGH (ref 70–99)
Potassium: 4.3 mEq/L (ref 3.5–5.1)
Sodium: 140 mEq/L (ref 135–145)
TOTAL PROTEIN: 7.5 g/dL (ref 6.0–8.3)

## 2014-09-07 LAB — MICROALBUMIN / CREATININE URINE RATIO
Creatinine,U: 123.2 mg/dL
MICROALB/CREAT RATIO: 2 mg/g (ref 0.0–30.0)
Microalb, Ur: 2.5 mg/dL — ABNORMAL HIGH (ref 0.0–1.9)

## 2014-09-07 LAB — VITAMIN D 25 HYDROXY (VIT D DEFICIENCY, FRACTURES): VITD: 26.09 ng/mL — AB (ref 30.00–100.00)

## 2014-09-07 LAB — VITAMIN B12: Vitamin B-12: 254 pg/mL (ref 211–911)

## 2014-09-07 LAB — TSH: TSH: 2.41 u[IU]/mL (ref 0.35–4.50)

## 2014-09-07 MED ORDER — PROMETHAZINE HCL 25 MG RE SUPP: 25.0000 mg | Freq: Four times a day (QID) | RECTAL | Status: DC | PRN

## 2014-09-07 MED ORDER — PROMETHAZINE HCL 25 MG PO TABS: 25.0000 mg | ORAL_TABLET | Freq: Four times a day (QID) | ORAL | Status: DC | PRN

## 2014-09-07 MED ORDER — FLUOXETINE HCL 20 MG PO CAPS: 20.0000 mg | ORAL_CAPSULE | Freq: Every day | ORAL | Status: DC

## 2014-09-07 MED ORDER — ESTRADIOL 0.1 MG/GM VA CREA: TOPICAL_CREAM | VAGINAL | Status: DC

## 2014-09-07 NOTE — Assessment & Plan Note (Signed)
Will check lipids with labs today.  

## 2014-09-07 NOTE — Assessment & Plan Note (Signed)
Symptoms well controlled with Fluoxetine. Will continue.

## 2014-09-07 NOTE — Progress Notes (Signed)
Subjective:    Patient ID: Gina Harper, female    DOB: [birth year], 64 y.o.   MRN: [id number]  HPI  64YO female presents to establish care.  HTN - BP at home typically 120s/70-80s.  Recently having heavy feeling in right leg. Questions if varicose veins may be causing this. Scheduled appointment with Dr. Lucky Cowboy. Diagnosed with neuropathy in the past. Noted to have cervical radiculopathy in the past. Opted not to have surgery. Seen by Dr. Manuella Ghazi at [facility].  Past medical, surgical, family and social history per today's encounter.  Review of Systems  Constitutional: Negative for fever, chills, appetite change, fatigue and unexpected weight change.  Eyes: Negative for visual disturbance.  Respiratory: Negative for cough, chest tightness, shortness of breath and wheezing.   Cardiovascular: Negative for chest pain and leg swelling.  Gastrointestinal: Negative for nausea, vomiting, abdominal pain, diarrhea and constipation.  Musculoskeletal: Positive for myalgias. Negative for arthralgias.  Skin: Negative for color change and rash.  Hematological: Negative for adenopathy. Does not bruise/bleed easily.  Psychiatric/Behavioral: Negative for sleep disturbance and dysphoric mood. The patient is not nervous/anxious.        Objective:    BP 136/81 mmHg  Pulse 56  Temp(Src) 97.8 F (36.6 C) (Oral)  Ht 5' 2.5" (1.588 m)  Wt 174 lb (78.926 kg)  BMI 31.30 kg/m2  SpO2 95% Physical Exam  Constitutional: She is oriented to person, place, and time. She appears well-developed and well-nourished. No distress.  HENT:  Head: Normocephalic and atraumatic.  Right Ear: External ear normal.  Left Ear: External ear normal.  Nose: Nose normal.  Mouth/Throat: Oropharynx is clear and moist. No oropharyngeal exudate.  Eyes: Conjunctivae and EOM are normal. Pupils are equal, round, and reactive to light. Right eye exhibits no discharge.  Neck: Normal range of motion. Neck supple. No thyromegaly  present.  Cardiovascular: Normal rate, regular rhythm, normal heart sounds and intact distal pulses.  Exam reveals no gallop and no friction rub.   No murmur heard. Pulmonary/Chest: Effort normal. No respiratory distress. She has no wheezes. She has no rales.  Abdominal: Soft. Bowel sounds are normal. She exhibits no distension and no mass. There is no tenderness. There is no rebound and no guarding.  Musculoskeletal: Normal range of motion. She exhibits no edema or tenderness.  Lymphadenopathy:    She has no cervical adenopathy.  Neurological: She is alert and oriented to person, place, and time. No cranial nerve deficit. Coordination normal.  Skin: Skin is warm and dry. No rash noted. She is not diaphoretic. No erythema. No pallor.  Psychiatric: She has a normal mood and affect. Her behavior is normal. Judgment and thought content normal.          Assessment & Plan:   Problem List Items Addressed This Visit      Unprioritized   Asthma    No current symptoms. Will continue prn Albuterol      Depression with anxiety    Symptoms well controlled with Fluoxetine. Will continue.      Essential hypertension, benign - Primary    BP Readings from Last 3 Encounters:  09/07/14 136/81  07/04/14 124/74  01/31/14 134/86   BP well controlled. Check renal function with labs. Continue Losartan and HCTZ.      Relevant Orders   CBC with Differential/Platelet   Comprehensive metabolic panel   Lipid panel   Microalbumin / creatinine urine ratio   Vit D  25 hydroxy (rtn osteoporosis monitoring)   TSH  Peripheral neuropathy    History of neuropathy. Reviewed notes from Dr. Manuella Ghazi. Will check TSH, CBC, CMP, B12 with labs.      Relevant Medications   FLUoxetine (PROZAC) 20 MG capsule   Pure hypercholesterolemia    Will check lipids with labs today.       Other Visit Diagnoses    Screening for breast cancer        Relevant Orders    MM Digital Screening    Hereditary and  idiopathic peripheral neuropathy        Relevant Medications    FLUoxetine (PROZAC) 20 MG capsule    Other Relevant Orders    B12        Return in about 3 months (around 12/08/2014) for Physical.

## 2014-09-07 NOTE — Assessment & Plan Note (Signed)
No current symptoms. Will continue prn Albuterol

## 2014-09-07 NOTE — Patient Instructions (Signed)
Labs today

## 2014-09-07 NOTE — Assessment & Plan Note (Signed)
History of neuropathy. Reviewed notes from Dr. Manuella Ghazi. Will check TSH, CBC, CMP, B12 with labs.

## 2014-09-07 NOTE — Progress Notes (Signed)
Pre visit review using our clinic review tool, if applicable. No additional management support is needed unless otherwise documented below in the visit note. 

## 2014-09-07 NOTE — Assessment & Plan Note (Signed)
BP Readings from Last 3 Encounters:  09/07/14 136/81  07/04/14 124/74  01/31/14 134/86   BP well controlled. Check renal function with labs. Continue Losartan and HCTZ.

## 2014-09-24 ENCOUNTER — Telehealth: Payer: Self-pay

## 2014-09-24 ENCOUNTER — Other Ambulatory Visit: Payer: Self-pay

## 2014-09-24 MED ORDER — CYANOCOBALAMIN 1000 MCG/ML IJ SOLN: 1000.0000 ug | INTRAMUSCULAR | Status: DC

## 2014-09-24 MED ORDER — "SYRINGE/NEEDLE (DISP) 25G X 1"" 3 ML MISC": Status: DC

## 2014-09-24 NOTE — Telephone Encounter (Signed)
Spoke with patient regarding her mychart message.  Patient agreed to start B12 injections.  Patient will self give injections, ordered and sent to mail order pharmacy for next 6 months (3 weekly shots and then monthly for 5 months)  Patient verbalized understanding.

## 2014-10-07 ENCOUNTER — Other Ambulatory Visit: Payer: Self-pay | Admitting: Family Medicine

## 2014-11-19 ENCOUNTER — Other Ambulatory Visit: Payer: Self-pay

## 2014-11-19 DIAGNOSIS — I10 Essential (primary) hypertension: Secondary | ICD-10-CM

## 2014-11-19 MED ORDER — HYDROCHLOROTHIAZIDE 25 MG PO TABS: 25.0000 mg | ORAL_TABLET | Freq: Every day | ORAL | Status: DC

## 2014-11-19 MED ORDER — LOSARTAN POTASSIUM 25 MG PO TABS: 25.0000 mg | ORAL_TABLET | Freq: Every day | ORAL | Status: DC

## 2014-12-03 ENCOUNTER — Ambulatory Visit
Admission: RE | Admit: 2014-12-03 | Discharge: 2014-12-03 | Disposition: A | Discharge status: Home / Self Care | Payer: 59 | Source: Ambulatory Visit | Attending: Internal Medicine | Admitting: Internal Medicine

## 2014-12-03 ENCOUNTER — Other Ambulatory Visit: Payer: Self-pay | Admitting: Internal Medicine

## 2014-12-03 DIAGNOSIS — Z1231 Encounter for screening mammogram for malignant neoplasm of breast: Secondary | ICD-10-CM | POA: Insufficient documentation

## 2014-12-03 DIAGNOSIS — Z1239 Encounter for other screening for malignant neoplasm of breast: Secondary | ICD-10-CM

## 2014-12-12 ENCOUNTER — Encounter: Payer: Self-pay | Admitting: Internal Medicine

## 2014-12-13 ENCOUNTER — Ambulatory Visit
Admission: RE | Admit: 2014-12-13 | Discharge: 2014-12-13 | Disposition: A | Discharge status: Home / Self Care | Payer: 59 | Source: Ambulatory Visit | Attending: Internal Medicine | Admitting: Internal Medicine

## 2014-12-13 ENCOUNTER — Ambulatory Visit (INDEPENDENT_AMBULATORY_CARE_PROVIDER_SITE_OTHER): Payer: 59 | Admitting: Internal Medicine

## 2014-12-13 ENCOUNTER — Encounter: Payer: Self-pay | Admitting: Internal Medicine

## 2014-12-13 VITALS — BP 103/66 | HR 58 | Temp 98.1°F | Wt 170.0 lb

## 2014-12-13 DIAGNOSIS — F418 Other specified anxiety disorders: Secondary | ICD-10-CM | POA: Diagnosis not present

## 2014-12-13 DIAGNOSIS — W19XXXA Unspecified fall, initial encounter: Secondary | ICD-10-CM | POA: Diagnosis not present

## 2014-12-13 DIAGNOSIS — R0781 Pleurodynia: Secondary | ICD-10-CM

## 2014-12-13 DIAGNOSIS — S2232XA Fracture of one rib, left side, initial encounter for closed fracture: Secondary | ICD-10-CM | POA: Insufficient documentation

## 2014-12-13 MED ORDER — TRAMADOL HCL 50 MG PO TABS: 50.0000 mg | ORAL_TABLET | Freq: Three times a day (TID) | ORAL | Status: DC | PRN

## 2014-12-13 NOTE — Patient Instructions (Signed)
Start Ibuprofen 800mg  three times daily for the next 3-4 days.  Add Tramadol 50mg  every 8hr as needed for pain.  Xray today.

## 2014-12-13 NOTE — Progress Notes (Signed)
Pre visit review using our clinic review tool, if applicable. No additional management support is needed unless otherwise documented below in the visit note. 

## 2014-12-13 NOTE — Assessment & Plan Note (Signed)
Recent worsening of depression. Will set up counseling with Dr. Rexene Edison. Follow up in 2 weeks.

## 2014-12-13 NOTE — Progress Notes (Signed)
Subjective:    Patient ID: Gina Harper, female    DOB: Aug 30, 1950, 64 y.o.   MRN: [id number]  HPI  64YO female presents for acute visit.  Slipped on bathroom floor and fell onto toilet. Hit left chest wall and left arm. Pain in chest described as severe. No dyspnea. Has to hold chest if coughs or sneezes. Not taking anything for pain.  Also notes some increased symptoms of depression, with ongoing issues with her son. Would like to set up counseling for this.  Past Medical History  Diagnosis Date  . Unspecified pruritic disorder   . Other abnormal glucose   . Insomnia, unspecified   . Esophageal reflux   . Alopecia, unspecified   . Depressive disorder, not elsewhere classified   . Disorder of bone and cartilage, unspecified   . Tobacco use disorder   . Unspecified essential hypertension   . Palpitations   . Unspecified hearing loss   . Allergic rhinitis, cause unspecified   . Chicken pox   . Mumps   . Measles     red measles  . Arthritis   . Syncope, vasovagal   . History of shingles   . GERD (gastroesophageal reflux disease)   . Hepatitis   . Allergy   . Unspecified asthma(493.90)     exercise induced. Never hospitalized  . Abdominal epilepsy     rectal spasm,syncope, nausea, ongoing for years   Family History  Problem Relation Age of Onset  . Cancer Mother     Lung, Bladder  . Stroke Father 74    TIAs  . Heart disease Father 65    AMI x 2.  . Colon polyps Father   . Thyroid disease Sister   . Cancer Brother     lung  . Breast cancer Maternal Aunt 60   Past Surgical History  Procedure Laterality Date  . Bunionectomy  [id number]    bilateral  . Rhinoplasty with septoplasty    . Tubal ligation    . Bladder tact  1995  . Vaginal hysterectomy  1997    menorrhagia ovaries intact  . Tonsillectomy and adenoidectomy    . Colonoscopy  04/20/2004    no polyps; normal.  Repeat in 10 years.  [place].  Marland Kitchen Cosmetic surgery      rhinoplasty   Social History     Social History  . Marital Status: Married    Spouse Name: N/A  . Number of Children: 1  . Years of Education: college   Occupational History  . 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   Social History Main Topics  . Smoking status: Former Smoker -- 1.00 packs/day for 17 years    Types: Cigarettes    Quit date: 05/22/1988  . Smokeless tobacco: None     Comment: Quit 22 years ago  . Alcohol Use: 3.6 oz/week    6 Standard drinks or equivalent per week     Comment: moderate white wine 5 glasses per week  . Drug Use: No  . Sexual Activity:    Partners: Male    Birth Control/ Protection: Post-menopausal, Surgical   Other Topics Concern  . None   Social History Narrative      Marital status:  Married x 40 years, happily married; no domestic abuse.      Children:  1 child; 3 grandchildren local.      Employment:  Retired in 09/2012; [place] x 21 years; happy.  Pre-admission testing.  Tobacco:  Previous smoker. Quit 1990      Alcohol:  2 glasses of wine three nights per week.      Drugs; none      Exercise:  Treadmill, elliptical, Zumba.  [employer] 3x per week.      Seatbelt:  Always uses seat belts.       +Smoke alarm and carbon monoxide detector in the home.       Guns:  Guns stored in locked cabinet.       Caffeine use: Coffee, Tea, Carbonated beverages, 3 servings / day.                     Review of Systems  Constitutional: Negative for fever, chills, appetite change, fatigue and unexpected weight change.  Eyes: Negative for visual disturbance.  Respiratory: Negative for cough, shortness of breath and wheezing.   Cardiovascular: Positive for chest pain. Negative for palpitations and leg swelling.  Gastrointestinal: Negative for abdominal pain.  Musculoskeletal: Positive for myalgias, back pain and arthralgias.  Skin: Negative for color change and rash.  Hematological: Negative for adenopathy. Does not bruise/bleed easily.  Psychiatric/Behavioral: Positive for sleep  disturbance and dysphoric mood. Negative for suicidal ideas. The patient is not nervous/anxious.        Objective:    BP 103/66 mmHg  Pulse 58  Temp(Src) 98.1 F (36.7 C) (Oral)  Wt 170 lb (77.111 kg)  SpO2 96% Physical Exam  Constitutional: She is oriented to person, place, and time. She appears well-developed and well-nourished. No distress.  HENT:  Head: Normocephalic and atraumatic.  Right Ear: External ear normal.  Left Ear: External ear normal.  Nose: Nose normal.  Mouth/Throat: Oropharynx is clear and moist.  Eyes: Conjunctivae are normal. Pupils are equal, round, and reactive to light. Right eye exhibits no discharge. Left eye exhibits no discharge. No scleral icterus.  Neck: Normal range of motion. Neck supple. No tracheal deviation present. No thyromegaly present.  Cardiovascular: Normal rate, regular rhythm, normal heart sounds and intact distal pulses.  Exam reveals no gallop and no friction rub.   No murmur heard. Pulmonary/Chest: Effort normal and breath sounds normal. No accessory muscle usage. No respiratory distress. She has no decreased breath sounds. She has no wheezes. She has no rhonchi. She has no rales. She exhibits no tenderness.    Musculoskeletal: Normal range of motion. She exhibits no edema or tenderness.  Lymphadenopathy:    She has no cervical adenopathy.  Neurological: She is alert and oriented to person, place, and time. No cranial nerve deficit. She exhibits normal muscle tone. Coordination normal.  Skin: Skin is warm and dry. No rash noted. She is not diaphoretic. No erythema. No pallor.  Psychiatric: Her behavior is normal. Judgment and thought content normal. She exhibits a depressed mood. She expresses no suicidal ideation.          Assessment & Plan:   Problem List Items Addressed This Visit      Unprioritized   Depression with anxiety    Recent worsening of depression. Will set up counseling with Dr. Rexene Edison. Follow up in 2 weeks.        Relevant Orders   Ambulatory referral to Psychology   Rib pain on left side - Primary    Rib pain after fall. Will get plain xray for evaluation. Start Ibuprofen and prn Tramadol. Follow up prn and in 2 weeks.      Relevant Medications   traMADol (ULTRAM) 50 MG tablet  Other Relevant Orders   DG Ribs Unilateral Left       Return in about 2 weeks (around 12/27/2014) for Physical.

## 2014-12-13 NOTE — Assessment & Plan Note (Signed)
Rib pain after fall. Will get plain xray for evaluation. Start Ibuprofen and prn Tramadol. Follow up prn and in 2 weeks.

## 2014-12-14 ENCOUNTER — Telehealth: Payer: Self-pay | Admitting: Internal Medicine

## 2014-12-14 NOTE — Telephone Encounter (Signed)
Called pt to check on her. No answer. Left phone message for her to call back office.

## 2014-12-31 ENCOUNTER — Ambulatory Visit (INDEPENDENT_AMBULATORY_CARE_PROVIDER_SITE_OTHER): Payer: 59 | Admitting: Internal Medicine

## 2014-12-31 ENCOUNTER — Encounter: Payer: Self-pay | Admitting: Internal Medicine

## 2014-12-31 VITALS — BP 115/73 | HR 60 | Temp 98.9°F | Ht 62.5 in | Wt 169.0 lb

## 2014-12-31 DIAGNOSIS — Z1211 Encounter for screening for malignant neoplasm of colon: Secondary | ICD-10-CM | POA: Diagnosis not present

## 2014-12-31 DIAGNOSIS — Z0000 Encounter for general adult medical examination without abnormal findings: Secondary | ICD-10-CM | POA: Diagnosis not present

## 2014-12-31 LAB — COMPREHENSIVE METABOLIC PANEL
ALBUMIN: 4.4 g/dL (ref 3.5–5.2)
ALT: 41 U/L — ABNORMAL HIGH (ref 0–35)
AST: 32 U/L (ref 0–37)
Alkaline Phosphatase: 69 U/L (ref 39–117)
BUN: 14 mg/dL (ref 6–23)
CHLORIDE: 101 meq/L (ref 96–112)
CO2: 30 mEq/L (ref 19–32)
Calcium: 9.7 mg/dL (ref 8.4–10.5)
Creatinine, Ser: 0.68 mg/dL (ref 0.40–1.20)
GFR: 92.3 mL/min (ref >60.00)
Glucose, Bld: 91 mg/dL (ref 70–99)
POTASSIUM: 3.6 meq/L (ref 3.5–5.1)
SODIUM: 141 meq/L (ref 135–145)
Total Bilirubin: 0.5 mg/dL (ref 0.2–1.2)
Total Protein: 7.4 g/dL (ref 6.0–8.3)

## 2014-12-31 NOTE — Progress Notes (Signed)
Subjective:    Patient ID: Gina Harper, female    DOB: 1951/03/14, 64 y.o.   MRN: [id number]  HPI  64YO female presents for physical exam.  No concerns today. Feeling well. Walking daily. Recent mammogram normal. Due for colonoscopy.  Wt Readings from Last 3 Encounters:  12/31/14 169 lb (76.658 kg)  12/13/14 170 lb (77.111 kg)  09/07/14 174 lb (78.926 kg)    BP Readings from Last 3 Encounters:  12/31/14 115/73  12/13/14 103/66  09/07/14 136/81     Past Medical History  Diagnosis Date  . Unspecified pruritic disorder   . Other abnormal glucose   . Insomnia, unspecified   . Esophageal reflux   . Alopecia, unspecified   . Depressive disorder, not elsewhere classified   . Disorder of bone and cartilage, unspecified   . Tobacco use disorder   . Unspecified essential hypertension   . Palpitations   . Unspecified hearing loss   . Allergic rhinitis, cause unspecified   . Chicken pox   . Mumps   . Measles     red measles  . Arthritis   . Syncope, vasovagal   . History of shingles   . GERD (gastroesophageal reflux disease)   . Hepatitis   . Allergy   . Unspecified asthma(493.90)     exercise induced. Never hospitalized  . Abdominal epilepsy     rectal spasm,syncope, nausea, ongoing for years   Family History  Problem Relation Age of Onset  . Cancer Mother     Lung, Bladder  . Stroke Father 45    TIAs  . Heart disease Father 94    AMI x 2.  . Colon polyps Father   . Thyroid disease Sister   . Cancer Brother     lung  . Breast cancer Maternal Aunt 60   Past Surgical History  Procedure Laterality Date  . Bunionectomy  [id number]    bilateral  . Rhinoplasty with septoplasty    . Tubal ligation    . Bladder tact  1995  . Vaginal hysterectomy  1997    menorrhagia ovaries intact  . Tonsillectomy and adenoidectomy    . Colonoscopy  04/20/2004    no polyps; normal.  Repeat in 10 years.  [place].  Marland Kitchen Cosmetic surgery      rhinoplasty   Social History    Social History  . Marital Status: Married    Spouse Name: N/A  . Number of Children: 1  . Years of Education: college   Occupational History  . 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   Social History Main Topics  . Smoking status: Former Smoker -- 1.00 packs/day for 17 years    Types: Cigarettes    Quit date: 05/22/1988  . Smokeless tobacco: None     Comment: Quit 22 years ago  . Alcohol Use: 3.6 oz/week    6 Standard drinks or equivalent per week     Comment: moderate white wine 5 glasses per week  . Drug Use: No  . Sexual Activity:    Partners: Male    Birth Control/ Protection: Post-menopausal, Surgical   Other Topics Concern  . None   Social History Narrative      Marital status:  Married x 40 years, happily married; no domestic abuse.      Children:  1 child; 3 grandchildren local.      Employment:  Retired in 09/2012; [place] x 21 years; happy.  Pre-admission testing.  Tobacco:  Previous smoker. Quit 1990      Alcohol:  2 glasses of wine three nights per week.      Drugs; none      Exercise:  Treadmill, elliptical, Zumba.  [employer] 3x per week.      Seatbelt:  Always uses seat belts.       +Smoke alarm and carbon monoxide detector in the home.       Guns:  Guns stored in locked cabinet.       Caffeine use: Coffee, Tea, Carbonated beverages, 3 servings / day.                     Review of Systems  Constitutional: Negative for fever, chills, appetite change, fatigue and unexpected weight change.  Eyes: Negative for visual disturbance.  Respiratory: Negative for shortness of breath.   Cardiovascular: Negative for chest pain, palpitations and leg swelling.  Gastrointestinal: Negative for nausea, vomiting, abdominal pain, diarrhea and constipation.  Musculoskeletal: Negative for myalgias and arthralgias.  Skin: Negative for color change and rash.  Neurological: Negative for weakness.  Hematological: Negative for adenopathy. Does not bruise/bleed easily.   Psychiatric/Behavioral: Negative for sleep disturbance and dysphoric mood. The patient is not nervous/anxious.        Objective:    BP 115/73 mmHg  Pulse 60  Temp(Src) 98.9 F (37.2 C) (Oral)  Ht 5' 2.5" (1.588 m)  Wt 169 lb (76.658 kg)  BMI 30.40 kg/m2  SpO2 94% Physical Exam  Constitutional: She is oriented to person, place, and time. She appears well-developed and well-nourished. No distress.  HENT:  Head: Normocephalic and atraumatic.  Right Ear: External ear normal.  Left Ear: External ear normal.  Nose: Nose normal.  Mouth/Throat: Oropharynx is clear and moist. No oropharyngeal exudate.  Eyes: Conjunctivae are normal. Pupils are equal, round, and reactive to light. Right eye exhibits no discharge. Left eye exhibits no discharge. No scleral icterus.  Neck: Normal range of motion. Neck supple. No tracheal deviation present. No thyromegaly present.  Cardiovascular: Normal rate, regular rhythm, normal heart sounds and intact distal pulses.  Exam reveals no gallop and no friction rub.   No murmur heard. Pulmonary/Chest: Effort normal and breath sounds normal. No accessory muscle usage. No tachypnea. No respiratory distress. She has no decreased breath sounds. She has no wheezes. She has no rales. She exhibits no tenderness. Right breast exhibits no inverted nipple, no mass, no nipple discharge, no skin change and no tenderness. Left breast exhibits no inverted nipple, no mass, no nipple discharge, no skin change and no tenderness. Breasts are symmetrical.  Abdominal: Soft. Bowel sounds are normal. She exhibits no distension and no mass. There is no tenderness. There is no rebound and no guarding.  Musculoskeletal: Normal range of motion. She exhibits no edema or tenderness.  Lymphadenopathy:    She has no cervical adenopathy.  Neurological: She is alert and oriented to person, place, and time. No cranial nerve deficit. She exhibits normal muscle tone. Coordination normal.  Skin:  Skin is warm and dry. No rash noted. She is not diaphoretic. No erythema. No pallor.  Psychiatric: She has a normal mood and affect. Her behavior is normal. Judgment and thought content normal.          Assessment & Plan:   Problem List Items Addressed This Visit      Unprioritized   Routine general medical examination at a health care facility - Primary    General medical exam normal  today including breast exam. PAP and pelvic deferred as normal 2015. Plan repeat PAP in 2018. Mammogram UTD and reviewed. Colonoscopy ordered. Flu vaccine to be obtained at local pharmacy. Discussed recommendation for Prevnar at age 46. Labs reviewed, repeat LFTs today. Encouraged continued healthy diet and exercise.      Relevant Orders   Comprehensive metabolic panel    Other Visit Diagnoses    Special screening for malignant neoplasms, colon        Relevant Orders    Ambulatory referral to Gastroenterology        Return in about 6 months (around 06/30/2015) for Recheck.

## 2014-12-31 NOTE — Patient Instructions (Signed)

## 2014-12-31 NOTE — Assessment & Plan Note (Signed)
General medical exam normal today including breast exam. PAP and pelvic deferred as normal 2015. Plan repeat PAP in 2018. Mammogram UTD and reviewed. Colonoscopy ordered. Flu vaccine to be obtained at local pharmacy. Discussed recommendation for Prevnar at age 64. Labs reviewed, repeat LFTs today. Encouraged continued healthy diet and exercise.

## 2014-12-31 NOTE — Progress Notes (Signed)
Pre visit review using our clinic review tool, if applicable. No additional management support is needed unless otherwise documented below in the visit note. 

## 2015-01-08 ENCOUNTER — Ambulatory Visit (INDEPENDENT_AMBULATORY_CARE_PROVIDER_SITE_OTHER): Payer: 59 | Admitting: Psychology

## 2015-01-08 DIAGNOSIS — F4323 Adjustment disorder with mixed anxiety and depressed mood: Secondary | ICD-10-CM | POA: Diagnosis not present

## 2015-01-23 ENCOUNTER — Ambulatory Visit: Payer: 59 | Admitting: Psychology

## 2015-02-05 ENCOUNTER — Ambulatory Visit (INDEPENDENT_AMBULATORY_CARE_PROVIDER_SITE_OTHER): Payer: 59 | Admitting: Psychology

## 2015-02-05 DIAGNOSIS — F4323 Adjustment disorder with mixed anxiety and depressed mood: Secondary | ICD-10-CM

## 2015-02-20 ENCOUNTER — Ambulatory Visit: Payer: 59 | Admitting: Psychology

## 2015-03-18 ENCOUNTER — Encounter: Payer: Self-pay | Admitting: Internal Medicine

## 2015-03-18 DIAGNOSIS — F32A Depression, unspecified: Secondary | ICD-10-CM

## 2015-03-18 DIAGNOSIS — F329 Major depressive disorder, single episode, unspecified: Secondary | ICD-10-CM

## 2015-03-29 ENCOUNTER — Encounter: Payer: Self-pay | Admitting: *Deleted

## 2015-03-31 ENCOUNTER — Emergency Department
Admission: EM | Admit: 2015-03-31 | Discharge: 2015-03-31 | Disposition: A | Discharge status: Home / Self Care | Payer: 59 | Attending: Emergency Medicine | Admitting: Emergency Medicine

## 2015-03-31 ENCOUNTER — Encounter: Payer: Self-pay | Admitting: Emergency Medicine

## 2015-03-31 ENCOUNTER — Emergency Department: Payer: 59

## 2015-03-31 DIAGNOSIS — Z7982 Long term (current) use of aspirin: Secondary | ICD-10-CM | POA: Diagnosis not present

## 2015-03-31 DIAGNOSIS — W109XXA Fall (on) (from) unspecified stairs and steps, initial encounter: Secondary | ICD-10-CM | POA: Diagnosis not present

## 2015-03-31 DIAGNOSIS — Y998 Other external cause status: Secondary | ICD-10-CM | POA: Insufficient documentation

## 2015-03-31 DIAGNOSIS — I10 Essential (primary) hypertension: Secondary | ICD-10-CM | POA: Insufficient documentation

## 2015-03-31 DIAGNOSIS — S93602A Unspecified sprain of left foot, initial encounter: Secondary | ICD-10-CM | POA: Diagnosis not present

## 2015-03-31 DIAGNOSIS — Y9289 Other specified places as the place of occurrence of the external cause: Secondary | ICD-10-CM | POA: Diagnosis not present

## 2015-03-31 DIAGNOSIS — S99922A Unspecified injury of left foot, initial encounter: Secondary | ICD-10-CM | POA: Diagnosis present

## 2015-03-31 DIAGNOSIS — Y9389 Activity, other specified: Secondary | ICD-10-CM | POA: Insufficient documentation

## 2015-03-31 DIAGNOSIS — Z79899 Other long term (current) drug therapy: Secondary | ICD-10-CM | POA: Diagnosis not present

## 2015-03-31 DIAGNOSIS — Z87891 Personal history of nicotine dependence: Secondary | ICD-10-CM | POA: Diagnosis not present

## 2015-03-31 NOTE — ED Provider Notes (Signed)
[facility] Emergency Department Provider Note  ____________________________________________  Time seen: Approximately 9:29 AM  I have reviewed the triage vital signs and the nursing notes.   HISTORY  Chief Complaint Foot Pain   HPI Gina Harper is a 64 y.o. female is here with complaint of left foot pain since last evening. Patient stated that she was wearing socks coming down carpeted steps when she slipped and missed approximately 2-3 steps. She states initially it hurt a great deal but was able to continue having her party last night. She denies any head injury or loss of consciousness. She took 800 mg of ibuprofen which seems to help at the time. This morning it is more swollen and she is concerned that she may have a fracture. She has crutches available to her. She rates her pain a 3 out of 10. She denies any neck pain or back pain with this injury.   Past Medical History  Diagnosis Date  . Unspecified pruritic disorder   . Other abnormal glucose   . Insomnia, unspecified   . Esophageal reflux   . Alopecia, unspecified   . Depressive disorder, not elsewhere classified   . Disorder of bone and cartilage, unspecified   . Tobacco use disorder   . Unspecified essential hypertension   . Palpitations   . Unspecified hearing loss   . Allergic rhinitis, cause unspecified   . Chicken pox   . Mumps   . Measles     red measles  . Arthritis   . Syncope, vasovagal   . History of shingles   . GERD (gastroesophageal reflux disease)   . Hepatitis   . Allergy   . Unspecified asthma(493.90)     exercise induced. Never hospitalized  . 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[place])     rectal spasm,syncope, nausea, ongoing for years    Patient Active Problem List   Diagnosis Date Noted  . Rib pain on left side 12/13/2014  . Chest pain 07/04/2014  . Chronic dermatitis of hands 01/31/2014  . Peripheral neuropathy ([place]) 01/31/2014  . Gastroesophageal reflux disease without  esophagitis 11/08/2013  . Routine general medical examination at a health care facility 08/15/2012  . Rectal bleeding 08/15/2012  . Depression with anxiety 04/05/2012  . Other abnormal glucose 04/05/2012  . Pure hypercholesterolemia 04/05/2012  . Atrophic vaginitis 04/05/2012  . Elevated CEA 04/05/2012  . Asthma 04/05/2012  . Essential hypertension, benign 04/05/2012    Past Surgical History  Procedure Laterality Date  . Bunionectomy  [id number]    bilateral  . Rhinoplasty with septoplasty    . Tubal ligation    . Bladder tact  1995  . Vaginal hysterectomy  1997    menorrhagia ovaries intact  . Tonsillectomy and adenoidectomy    . Colonoscopy  04/20/2004    no polyps; normal.  Repeat in 10 years.  [place].  Marland Kitchen Cosmetic surgery      rhinoplasty  . Esophagogastroduodenoscopy  03/30/2005    Current Outpatient Rx  Name  Route  Sig  Dispense  Refill  . albuterol (PROAIR HFA) 108 (90 BASE) MCG/ACT inhaler   Inhalation   Inhale 2 puffs into the lungs every 6 (six) hours as needed. Dispense:  3 inhalers   18 g   1   . aspirin 81 MG tablet   Oral   Take 81 mg by mouth daily.         . cetirizine (ZYRTEC) 10 MG tablet   Oral   Take 10 mg  by mouth daily.         . Cholecalciferol (VITAMIN D3) 2000 UNITS TABS   Oral   Take by mouth.         . cyanocobalamin (,VITAMIN B-12,) 1000 MCG/ML injection   Intramuscular   Inject 1 mL (1,000 mcg total) into the muscle once a week.   1 mL   3   . cyanocobalamin (,VITAMIN B-12,) 1000 MCG/ML injection   Intramuscular   Inject 1 mL (1,000 mcg total) into the muscle every 30 (thirty) days.   1 mL   5   . estradiol (ESTRACE) 0.1 MG/GM vaginal cream      Use as diretected   42.5 g   11   . FLUoxetine (PROZAC) 20 MG capsule   Oral   Take 1 capsule (20 mg total) by mouth daily.   90 capsule   3   . hydrochlorothiazide (HYDRODIURIL) 25 MG tablet   Oral   Take 1 tablet (25 mg total) by mouth daily.   90 tablet   3   .  losartan (COZAAR) 25 MG tablet   Oral   Take 1 tablet (25 mg total) by mouth daily.   90 tablet   3   . Multiple Vitamin (MULTI-VITAMINS) TABS   Oral   Take by mouth daily.         Marland Kitchen omeprazole (PRILOSEC) 20 MG capsule   Oral   Take 1 capsule (20 mg total) by mouth daily.   90 capsule   3   . Potassium Gluconate 550 MG TABS   Oral   Take by mouth.         . promethazine (PHENERGAN) 25 MG suppository   Rectal   Place 1 suppository (25 mg total) rectally every 6 (six) hours as needed.   12 each   1   . promethazine (PHENERGAN) 25 MG tablet   Oral   Take 1 tablet (25 mg total) by mouth every 6 (six) hours as needed.   90 tablet   1   . SYRINGE-NEEDLE, DISP, 3 ML 25G X 1" 3 ML MISC      Use one syringe/needle for each b12 injection, once a week x3 weeks and then once a month x5 months.   10 each   0   . traMADol (ULTRAM) 50 MG tablet   Oral   Take 1 tablet (50 mg total) by mouth every 8 (eight) hours as needed.   30 tablet   0     Allergies Cefdinir; Lisinopril; Meloxicam; Omnipen; Augmentin; and Clarithromycin  Family History  Problem Relation Age of Onset  . Cancer Mother     Lung, Bladder  . Stroke Father 60    TIAs  . Heart disease Father 34    AMI x 2.  . Colon polyps Father   . Thyroid disease Sister   . Cancer Brother     lung  . Breast cancer Maternal Aunt 60    Social History Social History  Substance Use Topics  . Smoking status: Former Smoker -- 1.00 packs/day for 17 years    Types: Cigarettes    Quit date: 05/22/1988  . Smokeless tobacco: None     Comment: Quit 22 years ago  . Alcohol Use: 3.6 oz/week    6 Standard drinks or equivalent per week     Comment: moderate white wine 5 glasses per week    Review of Systems Constitutional: No fever/chills Eyes: No visual changes. ENT: No  trauma Cardiovascular: Denies chest pain. Respiratory: Denies shortness of breath. Gastrointestinal:   No nausea, no vomiting.    Musculoskeletal: Negative for back pain. Positive left foot pain Skin: Negative for rash. Neurological: Negative for headaches, focal weakness or numbness.  10-point ROS otherwise negative.  ____________________________________________   PHYSICAL EXAM:  VITAL SIGNS: ED Triage Vitals  Enc Vitals Group     BP 03/31/15 0911 132/72 mmHg     Pulse Rate 03/31/15 0911 60     Resp 03/31/15 0911 18     Temp 03/31/15 0911 98.3 F (36.8 C)     Temp Source 03/31/15 0911 Oral     SpO2 03/31/15 0911 96 %     Weight 03/31/15 0911 157 lb (71.215 kg)     Height 03/31/15 0911 5\' 2"  (1.575 m)     Head Cir --      Peak Flow --      Pain Score 03/31/15 0911 3     Pain Loc --      Pain Edu? --      Excl. in [place]? --     Constitutional: Alert and oriented. Well appearing and in no acute distress. Eyes: Conjunctivae are normal. PERRL. EOMI. Head: Atraumatic. Nose: No congestion/rhinnorhea. Neck: No stridor.  No cervical tenderness on palpation. Cardiovascular: Normal rate, regular rhythm. Grossly normal heart sounds.  Good peripheral circulation. Respiratory: Normal respiratory effort.  No retractions. Lungs CTAB. Gastrointestinal: Soft and nontender. No distention.  Musculoskeletal: Left foot no gross deformity. There is some soft tissue swelling on the lateral aspect of the left foot dorsal aspect. Motor sensory function intact. Range of motion is guarded secondary to patient's pain. There is no tenderness on palpation of the ankle bilaterally. Neurologic:  Normal speech and language. No gross focal neurologic deficits are appreciated. No gait instability. Skin:  Skin is warm, dry and intact. No rash noted. No abrasions and skin is intact however there is some ecchymosis on the lateral aspect of the left foot. Psychiatric: Mood and affect are normal. Speech and behavior are normal.  ____________________________________________   LABS (all labs ordered are listed, but only abnormal results  are displayed)  Labs Reviewed - No data to display   RADIOLOGY X-ray left foot per radiologist no fracture or dislocation. I, Johnn Hai, personally viewed and evaluated these images (plain radiographs) as part of my medical decision making.   ___________________________________________   PROCEDURES  Procedure(s) performed: None  Critical Care performed: No  ____________________________________________   INITIAL IMPRESSION / ASSESSMENT AND PLAN / ED COURSE  Pertinent labs & imaging results that were available during my care of the patient were reviewed by me and considered in my medical decision making (see chart for details)  Patient was placed in an Ace wrap and a postop shoe. Patient has prescription for tramadol at home. She is encouraged to ice and elevate as needed for swelling and pain. She will follow-up with her primary care doctor if any continued problems.   ____________________________________________   FINAL CLINICAL IMPRESSION(S) / ED DIAGNOSES  Final diagnoses:  Sprain of left foot, initial encounter      Johnn Hai, PA-C 03/31/15 1324  Lavonia Drafts, MD 03/31/15 1328

## 2015-03-31 NOTE — ED Notes (Signed)
Pt refused wheelchair to the lobby. NAD noted at this time. Pt denies comments/concerns at this time.

## 2015-03-31 NOTE — Discharge Instructions (Signed)
Foot Sprain °A foot sprain is an injury to one of the strong bands of tissue (ligaments) that connect and support the many bones in your feet. The ligament can be stretched too much or it can tear. A tear can be either partial or complete. The severity of the sprain depends on how much of the ligament was damaged or torn. °CAUSES °A foot sprain is usually caused by suddenly twisting or pivoting your foot. °RISK FACTORS °This injury is more likely to occur in people who: °· Play a sport, such as basketball or football. °· Exercise or play a sport without warming up. °· Start a new workout or sport. °· Suddenly increase how long or hard they exercise or play a sport. °SYMPTOMS °Symptoms of this condition start soon after an injury and include: °· Pain, especially in the arch of the foot. °· Bruising. °· Swelling. °· Inability to walk or use the foot to support body weight. °DIAGNOSIS °This condition is diagnosed with a medical history and physical exam. You may also have imaging tests, such as: °· X-rays to make sure there are no broken bones (fractures). °· MRI to see if the ligament has torn. °TREATMENT °Treatment varies depending on the severity of your sprain. Mild sprains can be treated with rest, ice, compression, and elevation (RICE). If your ligament is overstretched or partially torn, treatment usually involves keeping your foot in a fixed position (immobilization) for a period of time. To help you do this, your health care provider will apply a bandage, splint, or walking boot to keep your foot from moving until it heals. You may also be advised to use crutches or a scooter for a few weeks to avoid bearing weight on your foot while it is healing. °If your ligament is fully torn, you may need surgery to reconnect the ligament to the bone. After surgery, a cast or splint will be applied and will need to stay on your foot while it heals. °Your health care provider may also suggest exercises or physical therapy  to strengthen your foot. °HOME CARE INSTRUCTIONS °If You Have a Bandage, Splint, or Walking Boot: °· Wear it as directed by your health care provider. Remove it only as directed by your health care provider. °· Loosen the bandage, splint, or walking boot if your toes become numb and tingle, or if they turn cold and blue. °Bathing °· If your health care provider approves bathing and showering, cover the bandage or splint with a watertight plastic bag to protect it from water. Do not let the bandage or splint get wet. °Managing Pain, Stiffness, and Swelling  °· If directed, apply ice to the injured area: °¨ Put ice in a plastic bag. °¨ Place a towel between your skin and the bag. °¨ Leave the ice on for 20 minutes, 2-3 times per day. °· Move your toes often to avoid stiffness and to lessen swelling. °· Raise (elevate) the injured area above the level of your heart while you are sitting or lying down. °Driving °· Do not drive or operate heavy machinery while taking pain medicine. °· Do not drive while wearing a bandage, splint, or walking boot on a foot that you use for driving. °Activity °· Rest as directed by your health care provider. °· Do not use the injured foot to support your body weight until your health care provider says that you can. Use crutches or other supportive devices as directed by your health care provider. °· Ask your health care   provider what activities are safe for you. Gradually increase how much and how far you walk until your health care provider says it is safe to return to full activity.  Do any exercise or physical therapy as directed by your health care provider. General Instructions  If a splint was applied, do not put pressure on any part of it until it is fully hardened. This may take several hours.  Take medicines only as directed by your health care provider. These include over-the-counter medicines and prescription medicines.  Keep all follow-up visits as directed by your  health care provider. This is important.  When you can walk without pain, wear supportive shoes that have stiff soles. Do not wear flip-flops, and do not walk barefoot. SEEK MEDICAL CARE IF:  Your pain is not controlled with medicine.  Your bruising or swelling gets worse or does not get better with treatment.  Your splint or walking boot is damaged. SEEK IMMEDIATE MEDICAL CARE IF:  Your foot is numb or blue.  Your foot feels colder than normal.   This information is not intended to replace advice given to you by your health care provider. Make sure you discuss any questions you have with your health care provider.   Document Released: 09/26/2001 Document Revised: 08/21/2014 Document Reviewed: 02/07/2014 Elsevier Interactive Patient Education 2016 [place] DO? Elastic bandages come in different shapes and sizes. They generally provide support to your injury and reduce swelling while you are healing, but they can perform different functions. Your health care provider will help you to decide what is best for your protection, recovery, or rehabilitation following an injury. WHAT ARE SOME GENERAL TIPS FOR USING AN ELASTIC BANDAGE?  Use the bandage as directed by the maker of the bandage that you are using.  Do not wrap the bandage too tightly. This may cut off the circulation in the arm or leg in the area below the bandage.  If part of your body beyond the bandage becomes blue, numb, cold, swollen, or is more painful, your bandage is most likely too tight. If this occurs, remove your bandage and reapply it more loosely.  See your health care provider if the bandage seems to be making your problems worse rather than better.  An elastic bandage should be removed and reapplied every 3-4 hours or as directed by your health care provider. WHAT IS RICE? The routine care of many injuries includes rest, ice, compression, and  elevation (RICE therapy).  Rest Rest is required to allow your body to heal. Generally, you can resume your routine activities when you are comfortable and have been given permission by your health care provider. Ice Icing your injury helps to keep the swelling down and it reduces pain. Do not apply ice directly to your skin.  Put ice in a plastic bag.  Place a towel between your skin and the bag.  Leave the ice on for 20 minutes, 2-3 times per day. Do this for as long as you are directed by your health care provider. Compression Compression helps to keep swelling down, gives support, and helps with discomfort. Compression may be done with an elastic bandage. Elevation Elevation helps to reduce swelling and it decreases pain. If possible, your injured area should be placed at or above the level of your heart or the center of your chest. [place]? You should seek medical care if:  You have persistent  pain and swelling.  Your symptoms are getting worse rather than improving. These symptoms may indicate that further evaluation or further X-rays are needed. Sometimes, X-rays may not show a small broken bone (fracture) until a number of days later. Make a follow-up appointment with your health care provider. Ask when your X-ray results will be ready. Make sure that you get your X-ray results. WHEN SHOULD I SEEK IMMEDIATE MEDICAL CARE? You should seek immediate medical care if:  You have a sudden onset of severe pain at or below the area of your injury.  You develop redness or increased swelling around your injury.  You have tingling or numbness at or below the area of your injury that does not improve after you remove the elastic bandage.   This information is not intended to replace advice given to you by your health care provider. Make sure you discuss any questions you have with your health care provider.   Document Released: 09/26/2001 Document Revised:  12/26/2014 Document Reviewed: 11/20/2013 Elsevier Interactive Patient Education 2016 [place] and elevate left foot as needed for swelling. You may continue to take tramadol but discontinued taking ibuprofen until you speak with someone about your scheduled colonoscopy. Use crutches as needed. Follow-up with your PCP if any continued problems.

## 2015-03-31 NOTE — ED Notes (Signed)
Pt fell down about 2-3 steps injuring left foot.

## 2015-04-01 ENCOUNTER — Ambulatory Visit: Admission: RE | Admit: 2015-04-01 | Payer: 59 | Source: Ambulatory Visit | Admitting: Gastroenterology

## 2015-04-01 ENCOUNTER — Encounter: Admission: RE | Payer: Self-pay | Source: Ambulatory Visit

## 2015-04-01 SURGERY — COLONOSCOPY
Anesthesia: General

## 2015-04-10 ENCOUNTER — Ambulatory Visit (INDEPENDENT_AMBULATORY_CARE_PROVIDER_SITE_OTHER): Payer: 59 | Admitting: Licensed Clinical Social Worker

## 2015-04-10 DIAGNOSIS — F329 Major depressive disorder, single episode, unspecified: Secondary | ICD-10-CM | POA: Diagnosis not present

## 2015-04-10 DIAGNOSIS — F32A Depression, unspecified: Secondary | ICD-10-CM

## 2015-04-10 NOTE — Progress Notes (Signed)
Patient:   Gina Harper   DOB:   1950/07/09  MR Number:  [id number]  Location:  [facility] [address] [place] [address] Dept: [phone]           Date of Service:   04/10/2015  Start Time:   1p End Time:   2p  Provider/Observer:  Lubertha South Counselor       Billing Code/Service: [phone]  Behavioral Observation: Gina Harper  presents as a 64 y.o.-year-old Caucasian Female who appeared her stated age. her dress was Appropriate and she was Casual and Neat and her manners were Appropriate to the situation.  There were not any physical disabilities noted.  she displayed an appropriate level of cooperation and motivation.    Interactions:    Active   Attention:   within normal limits  Memory:   within normal limits  Speech (Volume):  normal  Speech:   normal pitch and normal volume  Thought Process:  Coherent and Relevant  Though Content:  WNL  Orientation:   person, place, time/date and situation  Judgment:   Fair  Planning:   Fair  Affect:    Appropriate  Mood:    Depressed  Insight:   Fair  Intelligence:   normal  Chief Complaint:     Chief Complaint  Patient presents with  . Depression  . 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  Education:   Secretary/administrator; [employer] graduated in 1974 "school was great"   Attended Photographer "school was Automotive engineer History:  Denies  Careers adviser:  Denies   Religious/Spiritual Preferences:  [place]  Family/Childhood History:                            Born in [place], has 4 siblings; has twin sister; has 3 younger brothers; raised by both parents; both parents are deceased Describes childhood as "average, mom and dad both worked, we were happy, had everything we needed."   Children/Grand-children:    Ovid Curd 40/has three Grandchildren [employer] 36, [place] 100, [place] 12)  Natural/Informal Support:                           Has friends, husband, twin sister   Substance Use:  No concerns of substance abuse are reported. Drinks about 2 glasses of wine in a week; began drinking in her 55s   Medical History:   Past Medical History  Diagnosis Date  . Unspecified pruritic disorder   . Other abnormal glucose   . Insomnia, unspecified   . Esophageal reflux   . Alopecia, unspecified   . Depressive disorder, not elsewhere classified   . Disorder of bone and cartilage, unspecified   . Tobacco use disorder   . Unspecified essential hypertension   . Palpitations   . Unspecified hearing loss   . Allergic rhinitis, cause unspecified   . Chicken pox   . Mumps   . Measles  red measles  . Arthritis   . Syncope, vasovagal   . History of shingles   . GERD (gastroesophageal reflux disease)   . Hepatitis   . Allergy   . Unspecified asthma(493.90)     exercise induced. Never hospitalized  . 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  Always use your most recent med list.               albuterol 108 (90 BASE) MCG/ACT inhaler  Commonly known as:  PROAIR HFA  Inhale 2 puffs into the lungs every 6 (six) hours as needed. Dispense:  3 inhalers     aspirin 81 MG tablet  Take 81 mg by mouth daily.     cetirizine 10 MG tablet  Commonly known as:  ZYRTEC  Take 10 mg by mouth daily.     cyanocobalamin 1000 MCG/ML injection  Commonly known as:  (VITAMIN B-12)  Inject 1 mL (1,000 mcg total) into the muscle once a  week.     cyanocobalamin 1000 MCG/ML injection  Commonly known as:  (VITAMIN B-12)  Inject 1 mL (1,000 mcg total) into the muscle every 30 (thirty) days.     estradiol 0.1 MG/GM vaginal cream  Commonly known as:  ESTRACE  Use as diretected     FLUoxetine 20 MG capsule  Commonly known as:  PROZAC  Take 1 capsule (20 mg total) by mouth daily.     hydrochlorothiazide 25 MG tablet  Commonly known as:  HYDRODIURIL  Take 1 tablet (25 mg total) by mouth daily.     losartan 25 MG tablet  Commonly known as:  COZAAR  Take 1 tablet (25 mg total) by mouth daily.     MULTI-VITAMINS Tabs  Take by mouth daily.     omeprazole 20 MG capsule  Commonly known as:  PRILOSEC  Take 1 capsule (20 mg total) by mouth daily.     Potassium Gluconate 550 MG Tabs  Take by mouth.     promethazine 25 MG tablet  Commonly known as:  PHENERGAN  Take 1 tablet (25 mg total) by mouth every 6 (six) hours as needed.     promethazine 25 MG suppository  Commonly known as:  PHENERGAN  Place 1 suppository (25 mg total) rectally every 6 (six) hours as needed.     SYRINGE-NEEDLE (DISP) 3 ML 25G X 1" 3 ML Misc  Use one syringe/needle for each b12 injection, once a week x3 weeks and then once a month x5 months.     traMADol 50 MG tablet  Commonly known as:  ULTRAM  Take 1 tablet (50 mg total) by mouth every 8 (eight) hours as needed.     Vitamin D3 2000 UNITS Tabs  Take by mouth.              Sexual History:   History  Sexual Activity  . Sexual Activity:  . Partners: Male  . Birth Control/ Protection: Post-menopausal, Surgical     Abuse/Trauma History: Denies   Psychiatric History:  Dr. Clovis Riley about 10 years ago   Strengths:   Helping others, helps brother who have medical concerns,    Recovery Goals:  "To be able to learn to live with her son not wanting her apart of his life."  Hobbies/Interests:  Movies, travel, lots of friends, go places,  clubs   Challenges/Barriers: Try not to dwell, ups and downs,     Family Med/Psych History:  Family History  Problem Relation Age of Onset  . Cancer Mother     Lung, Bladder  . Stroke Father 18    TIAs  . Heart disease Father 47    AMI x 2.  . Colon polyps Father   . Thyroid disease Sister   . Cancer Brother     lung  . Breast cancer Maternal Aunt 60    Risk of Suicide/Violence: moderate; has thoughts 3-4 times per year; denies having a plan "I am too scared to plan it"  History of Suicide/Violence:  Denies  Psychosis:   Denies  Diagnosis:    Depression  Impression/DX: Gina Harper is currently diagnosed with Major Depression, Recurrent, Moderate due to her current symptoms Sadness, don't want to live due to the lack of relationship that she has with her Grandchildren, does not have a relationship with her son.  Gina Harper will be best supported by medication management and outpatient therapy to assist with coping skills and understanding her triggers.  Gina Harper does not have a history of suicidal attempts.  Gina Harper denies current SI or HI.  She currently has minimal protective factors.  Her relationships with others are satisafactory.     Recommendation/Plan: Writer recommends Outpatient Therapy at least twice monthly to include but not limited to individual, group and or family therapy.  Medication Management is also recommended to assist with her mood.

## 2015-04-30 ENCOUNTER — Ambulatory Visit: Payer: Medicare Other | Admitting: Psychiatry

## 2015-05-17 ENCOUNTER — Encounter: Payer: Self-pay | Admitting: *Deleted

## 2015-05-20 ENCOUNTER — Ambulatory Visit: Payer: 59 | Admitting: Anesthesiology

## 2015-05-20 ENCOUNTER — Ambulatory Visit
Admission: RE | Admit: 2015-05-20 | Discharge: 2015-05-20 | Disposition: A | Discharge status: Home / Self Care | Payer: 59 | Source: Ambulatory Visit | Attending: Gastroenterology | Admitting: Gastroenterology

## 2015-05-20 ENCOUNTER — Encounter: Payer: Self-pay | Admitting: Anesthesiology

## 2015-05-20 ENCOUNTER — Encounter
Admission: RE | Disposition: A | Discharge status: Home / Self Care | Payer: Self-pay | Source: Ambulatory Visit | Attending: Gastroenterology

## 2015-05-20 DIAGNOSIS — M1990 Unspecified osteoarthritis, unspecified site: Secondary | ICD-10-CM | POA: Diagnosis not present

## 2015-05-20 DIAGNOSIS — K594 Anal spasm: Secondary | ICD-10-CM | POA: Diagnosis not present

## 2015-05-20 DIAGNOSIS — Z886 Allergy status to analgesic agent status: Secondary | ICD-10-CM | POA: Diagnosis not present

## 2015-05-20 DIAGNOSIS — F329 Major depressive disorder, single episode, unspecified: Secondary | ICD-10-CM | POA: Diagnosis not present

## 2015-05-20 DIAGNOSIS — K635 Polyp of colon: Secondary | ICD-10-CM | POA: Diagnosis not present

## 2015-05-20 DIAGNOSIS — K219 Gastro-esophageal reflux disease without esophagitis: Secondary | ICD-10-CM | POA: Insufficient documentation

## 2015-05-20 DIAGNOSIS — Z888 Allergy status to other drugs, medicaments and biological substances status: Secondary | ICD-10-CM | POA: Diagnosis not present

## 2015-05-20 DIAGNOSIS — Z880 Allergy status to penicillin: Secondary | ICD-10-CM | POA: Diagnosis not present

## 2015-05-20 DIAGNOSIS — K5730 Diverticulosis of large intestine without perforation or abscess without bleeding: Secondary | ICD-10-CM | POA: Insufficient documentation

## 2015-05-20 DIAGNOSIS — I10 Essential (primary) hypertension: Secondary | ICD-10-CM | POA: Diagnosis not present

## 2015-05-20 DIAGNOSIS — Z7982 Long term (current) use of aspirin: Secondary | ICD-10-CM | POA: Insufficient documentation

## 2015-05-20 DIAGNOSIS — J309 Allergic rhinitis, unspecified: Secondary | ICD-10-CM | POA: Diagnosis not present

## 2015-05-20 DIAGNOSIS — J45909 Unspecified asthma, uncomplicated: Secondary | ICD-10-CM | POA: Diagnosis not present

## 2015-05-20 DIAGNOSIS — Z1211 Encounter for screening for malignant neoplasm of colon: Secondary | ICD-10-CM | POA: Diagnosis not present

## 2015-05-20 DIAGNOSIS — Z8371 Family history of colonic polyps: Secondary | ICD-10-CM | POA: Insufficient documentation

## 2015-05-20 HISTORY — PX: COLONOSCOPY WITH PROPOFOL: SHX5780

## 2015-05-20 SURGERY — COLONOSCOPY WITH PROPOFOL
Anesthesia: General

## 2015-05-20 MED ORDER — PROPOFOL 10 MG/ML IV BOLUS
INTRAVENOUS | Status: DC | PRN
  Administered 2015-05-20: 50 mg via INTRAVENOUS

## 2015-05-20 MED ORDER — ONDANSETRON HCL 4 MG/2ML IJ SOLN
INTRAMUSCULAR | Status: DC | PRN
  Administered 2015-05-20: 4 mg via INTRAVENOUS

## 2015-05-20 MED ORDER — MIDAZOLAM HCL 5 MG/5ML IJ SOLN
INTRAMUSCULAR | Status: DC | PRN
  Administered 2015-05-20: 2 mg via INTRAVENOUS

## 2015-05-20 MED ORDER — DEXAMETHASONE SODIUM PHOSPHATE 10 MG/ML IJ SOLN
INTRAMUSCULAR | Status: DC | PRN
  Administered 2015-05-20: 10 mg via INTRAVENOUS

## 2015-05-20 MED ORDER — GLYCOPYRROLATE 0.2 MG/ML IJ SOLN
INTRAMUSCULAR | Status: DC | PRN
  Administered 2015-05-20: .2 mg via INTRAVENOUS

## 2015-05-20 MED ORDER — SODIUM CHLORIDE 0.9 % IV SOLN: INTRAVENOUS | Status: DC

## 2015-05-20 MED ORDER — PROPOFOL 500 MG/50ML IV EMUL
INTRAVENOUS | Status: DC | PRN
  Administered 2015-05-20: 100 ug/kg/min via INTRAVENOUS

## 2015-05-20 MED ORDER — SODIUM CHLORIDE 0.9 % IV SOLN
INTRAVENOUS | Status: DC
  Administered 2015-05-20: 08:00:00 via INTRAVENOUS
  Administered 2015-05-20: 1000 mL via INTRAVENOUS

## 2015-05-20 NOTE — Transfer of Care (Signed)
Immediate Anesthesia Transfer of Care Note  Patient: Gina Harper  Procedure(s) Performed: Procedure(s): COLONOSCOPY WITH PROPOFOL (N/A)  Patient Location: PACU  Anesthesia Type:General  Level of Consciousness: awake  Airway & Oxygen Therapy: Patient Spontanous Breathing and Patient connected to nasal cannula oxygen  Post-op Assessment: Report given to RN and Post -op Vital signs reviewed and stable  Post vital signs: Reviewed and stable  Last Vitals:  Filed Vitals:   05/20/15 0710 05/20/15 0902  BP: 118/72 102/54  Pulse: 58 67  Temp: 36.8 C 35.8 C  Resp: 16 12    Complications: No apparent anesthesia complications

## 2015-05-20 NOTE — Anesthesia Postprocedure Evaluation (Signed)
Anesthesia Post Note  Patient: Gina Harper  Procedure(s) Performed: Procedure(s) (LRB): COLONOSCOPY WITH PROPOFOL (N/A)  Patient location during evaluation: Endoscopy Anesthesia Type: General Level of consciousness: awake Pain management: pain level controlled Vital Signs Assessment: post-procedure vital signs reviewed and stable Respiratory status: spontaneous breathing Cardiovascular status: blood pressure returned to baseline Anesthetic complications: no    Last Vitals:  Filed Vitals:   05/20/15 0920 05/20/15 0930  BP: 121/74 115/66  Pulse: 62 56  Temp:    Resp: 12 12    Last Pain: There were no vitals filed for this visit.               Valeska Haislip S

## 2015-05-20 NOTE — H&P (Signed)
Outpatient short stay form Pre-procedure 05/20/2015 8:14 AM Gina Sails MD  Primary Physician: Dr. Ronette Deter  Reason for visit:  Colonoscopy  History of present illness:  Patient is a 65 year old female presenting today for screening colonoscopy. This would be her second colonoscopy. She has no personal history of colon polyps. Her father possibly had a history of polyps. Tolerated her prep well. She has held her 81 mg aspirin 4 several days. He takes no other aspirin or blood thinning products.    Current facility-administered medications:  .  0.9 %  sodium chloride infusion, , Intravenous, Continuous, Gina Sails, MD, Last Rate: 20 mL/hr at 05/20/15 0731, 1,000 mL at 05/20/15 0731 .  0.9 %  sodium chloride infusion, , Intravenous, Continuous, Gina Sails, MD  Prescriptions prior to admission  Medication Sig Dispense Refill Last Dose  . albuterol (PROAIR HFA) 108 (90 BASE) MCG/ACT inhaler Inhale 2 puffs into the lungs every 6 (six) hours as needed. Dispense:  3 inhalers 18 g 1 05/19/2015 at Unknown time  . aspirin 81 MG tablet Take 81 mg by mouth daily.   Past Month at Unknown time  . cetirizine (ZYRTEC) 10 MG tablet Take 10 mg by mouth daily.   05/19/2015 at Unknown time  . Cholecalciferol (VITAMIN D3) 2000 UNITS TABS Take by mouth.   05/19/2015 at Unknown time  . cyanocobalamin (,VITAMIN B-12,) 1000 MCG/ML injection Inject 1 mL (1,000 mcg total) into the muscle once a week. 1 mL 3 Past Week at Unknown time  . cyanocobalamin (,VITAMIN B-12,) 1000 MCG/ML injection Inject 1 mL (1,000 mcg total) into the muscle every 30 (thirty) days. 1 mL 5 05/19/2015 at Unknown time  . estradiol (ESTRACE) 0.1 MG/GM vaginal cream Use as diretected 42.5 g 11 Past Month at Unknown time  . FLUoxetine (PROZAC) 20 MG capsule Take 1 capsule (20 mg total) by mouth daily. 90 capsule 3 05/19/2015 at Unknown time  . hydrochlorothiazide (HYDRODIURIL) 25 MG tablet Take 1 tablet (25 mg total) by mouth  daily. 90 tablet 3 05/19/2015 at Unknown time  . losartan (COZAAR) 25 MG tablet Take 1 tablet (25 mg total) by mouth daily. 90 tablet 3 05/19/2015 at Unknown time  . Multiple Vitamin (MULTI-VITAMINS) TABS Take by mouth daily.   05/19/2015 at Unknown time  . omeprazole (PRILOSEC) 20 MG capsule Take 1 capsule (20 mg total) by mouth daily. 90 capsule 3 Past Week at Unknown time  . Potassium Gluconate 550 MG TABS Take by mouth.   05/19/2015 at Unknown time  . promethazine (PHENERGAN) 25 MG suppository Place 1 suppository (25 mg total) rectally every 6 (six) hours as needed. 12 each 1 Past Week at Unknown time  . promethazine (PHENERGAN) 25 MG tablet Take 1 tablet (25 mg total) by mouth every 6 (six) hours as needed. 90 tablet 1 Past Month at Unknown time  . SYRINGE-NEEDLE, DISP, 3 ML 25G X 1" 3 ML MISC Use one syringe/needle for each b12 injection, once a week x3 weeks and then once a month x5 months. 10 each 0 Past Month at Unknown time  . traMADol (ULTRAM) 50 MG tablet Take 1 tablet (50 mg total) by mouth every 8 (eight) hours as needed. 30 tablet 0 05/19/2015 at Unknown time     Allergies  Allergen Reactions  . Cefdinir Hives  . Lisinopril Cough and Other (See Comments)  . Meloxicam Swelling  . Omnipen [Ampicillin] Other (See Comments)  . Augmentin [Amoxicillin-Pot Clavulanate] Rash  . Clarithromycin Rash  Past Medical History  Diagnosis Date  . Unspecified pruritic disorder   . Other abnormal glucose   . Insomnia, unspecified   . Esophageal reflux   . Alopecia, unspecified   . Depressive disorder, not elsewhere classified   . Disorder of bone and cartilage, unspecified   . Tobacco use disorder   . Unspecified essential hypertension   . Palpitations   . Unspecified hearing loss   . Allergic rhinitis, cause unspecified   . Chicken pox   . Mumps   . Measles     red measles  . Arthritis   . Syncope, vasovagal   . History of shingles   . GERD (gastroesophageal reflux disease)    . Hepatitis   . Allergy   . Unspecified asthma(493.90)     exercise induced. Never hospitalized  . 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    HEENT: Normocephalic atraumatic eyes are anicteric    Other:     Pertinant exam for procedure: Soft nontender nondistended bowel sounds positive normoactive    Planned proceedures: Colonoscopy and indicated procedures. I have discussed the risks benefits and complications of procedures to include not limited to bleeding, infection, perforation and the risk of sedation and the patient wishes to proceed.    Gina Sails, MD Gastroenterology 05/20/2015  8:14 AM

## 2015-05-20 NOTE — Op Note (Signed)
[facility] Patient Name: Gina Harper Procedure Date: 05/20/2015 8:08 AM MRN: [id number] Account #: [id number] Date of Birth: January 30, 1951 Admit Type: Outpatient Age: 65 Room: [facility] ENDO ROOM 3 Gender: Female Note Status: Finalized Procedure:         Colonoscopy Indications:       Colon cancer screening in patient at increased risk:                     Family history of 1st-degree relative with colon polyps at                     age 31 years (or older) Providers:         Lollie Sails, MD Referring MD:      Eduard Clos. Gilford Rile, MD (Referring MD) Medicines:         Monitored Anesthesia Care Complications:     No immediate complications. Procedure:         Pre-Anesthesia Assessment:                    - ASA Grade Assessment: II - A patient with mild systemic                     disease.                    After obtaining informed consent, the colonoscope was                     passed under direct vision. Throughout the procedure, the                     patient's blood pressure, pulse, and oxygen saturations                     were monitored continuously. The Colonoscope was                     introduced through the anus and advanced to the the cecum,                     identified by appendiceal orifice and ileocecal valve. The                     colonoscopy was performed without difficulty. The patient                     tolerated the procedure well. The quality of the bowel                     preparation was good. Findings:      A 1 mm polyp was found in the recto-sigmoid colon. The polyp was       sessile. The polyp was removed with a cold biopsy forceps. Resection and       retrieval were complete.      Multiple small-mouthed diverticula were found in the sigmoid colon and       in the distal descending colon.      The retroflexed view of the distal rectum and anal verge was normal and       showed no anal or rectal  abnormalities.      The digital rectal exam was normal. Impression:        - One 1 mm polyp at the recto-sigmoid colon.  Resected and                     retrieved.                    - Diverticulosis in the sigmoid colon and in the distal                     descending colon.                    - The distal rectum and anal verge are normal on                     retroflexion view. Recommendation:    - Discharge patient to home.                    - Await pathology results.                    - Telephone GI clinic for pathology results in 1 week. Procedure Code(s): --- Professional ---                    [phone], Colonoscopy, flexible; with biopsy, single or                     multiple Diagnosis Code(s): --- Professional ---                    Z83.71, Family history of colonic polyps                    D12.7, Benign neoplasm of rectosigmoid junction                    K57.30, Diverticulosis of large intestine without                     perforation or abscess without bleeding CPT copyright 2014 American Medical Association. All rights reserved. The codes documented in this report are preliminary and upon coder review may  be revised to meet current compliance requirements. Lollie Sails, MD 05/20/2015 8:52:39 AM This report has been signed electronically. Number of Addenda: 0 Note Initiated On: 05/20/2015 8:08 AM Scope Withdrawal Time: 0 hours 11 minutes 9 seconds  Total Procedure Duration: 0 hours 26 minutes 10 seconds       [facility]

## 2015-05-20 NOTE — Anesthesia Preprocedure Evaluation (Addendum)
Anesthesia Evaluation  Patient identified by MRN, date of birth, ID band Patient awake    Reviewed: Allergy & Precautions, NPO status , Patient's Chart, lab work & pertinent test results, reviewed documented beta blocker date and time   Airway Mallampati: II  TM Distance: >3 FB     Dental  (+) Chipped   Pulmonary asthma , former smoker,           Cardiovascular hypertension, Pt. on medications      Neuro/Psych Seizures -,  PSYCHIATRIC DISORDERS Depression  Neuromuscular disease    GI/Hepatic GERD  ,(+) Hepatitis -  Endo/Other    Renal/GU      Musculoskeletal  (+) Arthritis ,   Abdominal   Peds  Hematology   Anesthesia Other Findings Denies seizures. Does have lower abdominal spasms which cause syncope. Albuterol only seldom.  Reproductive/Obstetrics                            Anesthesia Physical Anesthesia Plan  ASA: III  Anesthesia Plan: General   Post-op Pain Management:    Induction: Intravenous  Airway Management Planned: Nasal Cannula  Additional Equipment:   Intra-op Plan:   Post-operative Plan:   Informed Consent: I have reviewed the patients History and Physical, chart, labs and discussed the procedure including the risks, benefits and alternatives for the proposed anesthesia with the patient or authorized representative who has indicated his/her understanding and acceptance.     Plan Discussed with: CRNA  Anesthesia Plan Comments:         Anesthesia Quick Evaluation

## 2015-05-21 ENCOUNTER — Encounter: Payer: Self-pay | Admitting: Gastroenterology

## 2015-05-21 LAB — SURGICAL PATHOLOGY

## 2015-07-02 ENCOUNTER — Encounter: Payer: Self-pay | Admitting: Internal Medicine

## 2015-07-02 ENCOUNTER — Ambulatory Visit (INDEPENDENT_AMBULATORY_CARE_PROVIDER_SITE_OTHER): Payer: 59 | Admitting: Internal Medicine

## 2015-07-02 VITALS — BP 113/71 | HR 57 | Temp 97.9°F | Ht 63.0 in | Wt 157.4 lb

## 2015-07-02 DIAGNOSIS — Z23 Encounter for immunization: Secondary | ICD-10-CM

## 2015-07-02 DIAGNOSIS — M79604 Pain in right leg: Secondary | ICD-10-CM | POA: Insufficient documentation

## 2015-07-02 DIAGNOSIS — I10 Essential (primary) hypertension: Secondary | ICD-10-CM

## 2015-07-02 LAB — COMPREHENSIVE METABOLIC PANEL
ALT: 14 U/L (ref 0–35)
AST: 18 U/L (ref 0–37)
Albumin: 4.5 g/dL (ref 3.5–5.2)
Alkaline Phosphatase: 57 U/L (ref 39–117)
BUN: 15 mg/dL (ref 6–23)
CO2: 29 mEq/L (ref 19–32)
Calcium: 9.5 mg/dL (ref 8.4–10.5)
Chloride: 104 mEq/L (ref 96–112)
Creatinine, Ser: 0.73 mg/dL (ref 0.40–1.20)
GFR: 84.91 mL/min (ref >60.00)
Glucose, Bld: 101 mg/dL — ABNORMAL HIGH (ref 70–99)
Potassium: 3.9 mEq/L (ref 3.5–5.1)
Sodium: 141 mEq/L (ref 135–145)
Total Bilirubin: 0.6 mg/dL (ref 0.2–1.2)
Total Protein: 7.4 g/dL (ref 6.0–8.3)

## 2015-07-02 MED ORDER — CYANOCOBALAMIN 1000 MCG/ML IJ SOLN
1000.0000 ug | INTRAMUSCULAR | Status: DC
Start: 2015-07-02 — End: 2017-01-26

## 2015-07-02 MED ORDER — "SYRINGE/NEEDLE (DISP) 25G X 5/8"" 1 ML MISC": Status: DC

## 2015-07-02 NOTE — Patient Instructions (Signed)
Try stopping Losartan for a few days.  Email with blood pressure readings.

## 2015-07-02 NOTE — Assessment & Plan Note (Signed)
BP Readings from Last 3 Encounters:  07/02/15 113/71  05/20/15 115/66  03/31/15 132/72   BP running low. Will hold Losartan. She will email with BP readings. Check renal function with labs.

## 2015-07-02 NOTE — Assessment & Plan Note (Signed)
Right leg pain chronic. Diagnosed with venous insufficiency. Planning for vein ablation. Will follow.

## 2015-07-02 NOTE — Addendum Note (Signed)
Addended by: Vernetta Honey on: 07/02/2015 05:27 PM   Modules accepted: Orders

## 2015-07-02 NOTE — Progress Notes (Signed)
Subjective:    Patient ID: Gina Harper, female    DOB: [birth year], 65 y.o.   MRN: [id number]  HPI  65YO female presents for follow up.  Right leg pain - [place] last year showed venous reflux.  Considering venous ablation with Dr. Lucky Cowboy. Notes throbbing pain in leg at night.  HTN - would like to stop BP medication. Feels light headed at times.   Scheduled for Blepharoplasty 3/21.  Wt Readings from Last 3 Encounters:  07/02/15 157 lb 6 oz (71.385 kg)  05/20/15 154 lb (69.854 kg)  03/31/15 157 lb (71.215 kg)   BP Readings from Last 3 Encounters:  07/02/15 113/71  05/20/15 115/66  03/31/15 132/72    Past Medical History  Diagnosis Date  . Unspecified pruritic disorder   . Other abnormal glucose   . Insomnia, unspecified   . Esophageal reflux   . Alopecia, unspecified   . Depressive disorder, not elsewhere classified   . Disorder of bone and cartilage, unspecified   . Tobacco use disorder   . Unspecified essential hypertension   . Palpitations   . Unspecified hearing loss   . Allergic rhinitis, cause unspecified   . Chicken pox   . Mumps   . Measles     red measles  . Arthritis   . Syncope, vasovagal   . History of shingles   . GERD (gastroesophageal reflux disease)   . Hepatitis   . Allergy   . Unspecified asthma(493.90)     exercise induced. Never hospitalized  . 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 Cancer Mother     Lung, Bladder  . Stroke Father 62    TIAs  . Heart disease Father 45    AMI x 2.  . Colon polyps Father   . Thyroid disease Sister   . Cancer Brother     lung  . Breast cancer Maternal Aunt 60   Past Surgical History  Procedure Laterality Date  . Bunionectomy  [id number]    bilateral  . Rhinoplasty with septoplasty    . Tubal ligation    . Bladder tact  1995  . Vaginal hysterectomy  1997    menorrhagia ovaries intact  . Tonsillectomy and  adenoidectomy    . Colonoscopy  04/20/2004    no polyps; normal.  Repeat in 10 years.  [place].  Marland Kitchen Cosmetic surgery      rhinoplasty  . Esophagogastroduodenoscopy  03/30/2005  . Colonoscopy with propofol N/A 05/20/2015    Procedure: COLONOSCOPY WITH PROPOFOL;  Surgeon: Lollie Sails, MD;  Location: [facility] ENDOSCOPY;  Service: Endoscopy;  Laterality: N/A;   Social History   Social History  . Marital Status: Married    Spouse Name: N/A  . Number of Children: 1  . Years of Education: college   Occupational History  . 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   Social History Main Topics  . Smoking status: Former Smoker -- 1.00 packs/day for 17 years    Types: Cigarettes    Quit date: 05/22/1988  . Smokeless tobacco: None     Comment: Quit 22 years ago  . Alcohol Use: 3.6 oz/week    6 Standard drinks or equivalent per week     Comment: moderate white wine 5 glasses per week  . Drug Use: No  . Sexual Activity:    Partners: Male  Birth Control/ Protection: Post-menopausal, Surgical   Other Topics Concern  . None   Social History Narrative      Marital status:  Married x 40 years, happily married; no domestic abuse.      Children:  1 child; 3 grandchildren local.      Employment:  Retired in 09/2012; [place] x 21 years; happy.  Pre-admission testing.      Tobacco:  Previous smoker. Quit 1990      Alcohol:  2 glasses of wine three nights per week.      Drugs; none      Exercise:  Treadmill, elliptical, Zumba.  [employer] 3x per week.      Seatbelt:  Always uses seat belts.       +Smoke alarm and carbon monoxide detector in the home.       Guns:  Guns stored in locked cabinet.       Caffeine use: Coffee, Tea, Carbonated beverages, 3 servings / day.                     Review of Systems  Constitutional: Negative for fever, chills, appetite change, fatigue and unexpected weight change.  Eyes: Negative for visual disturbance.  Respiratory: Negative for shortness of breath.     Cardiovascular: Negative for chest pain and leg swelling.  Gastrointestinal: Negative for vomiting, abdominal pain, diarrhea and constipation.  Musculoskeletal: Positive for myalgias. Negative for arthralgias.  Skin: Negative for color change and rash.  Hematological: Negative for adenopathy. Does not bruise/bleed easily.  Psychiatric/Behavioral: Positive for sleep disturbance. Negative for dysphoric mood. The patient is not nervous/anxious.        Objective:    BP 113/71 mmHg  Pulse 57  Temp(Src) 97.9 F (36.6 C) (Oral)  Ht 5\' 3"  (1.6 m)  Wt 157 lb 6 oz (71.385 kg)  BMI 27.88 kg/m2  SpO2 96% Physical Exam  Constitutional: She is oriented to person, place, and time. She appears well-developed and well-nourished. No distress.  HENT:  Head: Normocephalic and atraumatic.  Right Ear: External ear normal.  Left Ear: External ear normal.  Nose: Nose normal.  Mouth/Throat: Oropharynx is clear and moist. No oropharyngeal exudate.  Eyes: Conjunctivae are normal. Pupils are equal, round, and reactive to light. Right eye exhibits no discharge. Left eye exhibits no discharge. No scleral icterus.  Neck: Normal range of motion. Neck supple. No tracheal deviation present. No thyromegaly present.  Cardiovascular: Normal rate, regular rhythm, normal heart sounds and intact distal pulses.  Exam reveals no gallop and no friction rub.   No murmur heard. Pulmonary/Chest: Effort normal and breath sounds normal. No respiratory distress. She has no wheezes. She has no rales. She exhibits no tenderness.  Musculoskeletal: Normal range of motion. She exhibits no edema or tenderness.  Lymphadenopathy:    She has no cervical adenopathy.  Neurological: She is alert and oriented to person, place, and time. No cranial nerve deficit. She exhibits normal muscle tone. Coordination normal.  Skin: Skin is warm and dry. No rash noted. She is not diaphoretic. No erythema. No pallor.  Psychiatric: She has a normal  mood and affect. Her behavior is normal. Judgment and thought content normal.          Assessment & Plan:   Problem List Items Addressed This Visit      Unprioritized   Essential hypertension, benign - Primary    BP Readings from Last 3 Encounters:  07/02/15 113/71  05/20/15 115/66  03/31/15 132/72  BP running low. Will hold Losartan. She will email with BP readings. Check renal function with labs.      Relevant Orders   Comprehensive metabolic panel   Right leg pain    Right leg pain chronic. Diagnosed with venous insufficiency. Planning for vein ablation. Will follow.          Return in about 6 months (around 01/02/2016) for Physical.  Ronette Deter, MD Internal Medicine [facility]

## 2015-07-02 NOTE — Progress Notes (Signed)
Pre visit review using our clinic review tool, if applicable. No additional management support is needed unless otherwise documented below in the visit note. 

## 2015-07-04 NOTE — Discharge Instructions (Signed)
INSTRUCTIONS FOLLOWING OCULOPLASTIC SURGERY °AMY M. FOWLER, MD ° °AFTER YOUR EYE SURGERY, THER ARE MANY THINGS THWIHC YOU, THE PATIENT, CAN DO TO ASSURE THE BEST POSSIBLE RESULT FROM YOUR OPERATION.  THIS SHEET SHOULD BE REFERRED TO WHENEVER QUESTIONS ARISE.  IF THERE ARE ANY QUESTIONS NOT ANSWERED HERE, DO NOT HESITATE TO CALL OUR OFFICE AT [phone] OR [phone].  THERE IS ALWAYS [facility] AVAILABLE TO CALL IF QUESTIONS OR PROBLEMS ARISE. ° °VISION: Your vision may be blurred and out of focus after surgery until you are able to stop using your ointment, swelling resolves and your eye(s) heal. This may take 1 to 2 weeks at the least.  If your vision becomes gradually more dim or dark, this is not normal and you need to call our office immediately. ° °EYE CARE: For the first 48 hours after surgery, use ice packs frequently - “20 minutes on, 20 minutes off” - to help reduce swelling and bruising.  Small bags of frozen peas or corn make good ice packs along with cloths soaked in ice water.  If you are wearing a patch or other type of dressing following surgery, keep this on for the amount of time specified by your doctor.  For the first week following surgery, you will need to treat your stitches with great care.  If is OK to shower, but take care to not allow soapy water to run into your eye(s) to help reduce changes of infection.  You may gently clean the eyelashes and around the eye(s) with cotton balls and sterile water, BUT DO NOT RUB THE STITCHES VIGOROUSLY.  Keeping your stitches moist with ointment will help promote healing with minimal scar formation. ° °ACTIVITY: When you leave the surgery center, you should go home, rest and be inactive.  The eye(s) may feel scratchy and keeping the eyes closed will allow for faster healing.  The first week following surgery, avoid straining (anything making the face turn red) or lifting over 20 pounds.  Additionally, avoid bending which causes your head to go below  your waist.  Using your eyes will NOT harm them, so feel free to read, watch television, use the computer, etc as desired.  Driving depends on each individual, so check with your doctor if you have questions about driving. ° °MEDICATIONS:  You will be given a prescription for an ointment to use 4 times a day on your stitches.  You can use the ointment in your eyes if they feel scratchy or irritated.  If you eyelid(s) don’t close completely when you sleep, put some ointment in your eyes before bedtime. ° °EMERGENCY: If you experience SEVERE EYE PAIN OR HEADACHE UNRELIEVED BY TYLENOL OR PERCOCET, NAUSEA OR VOMITING, WORSENING REDNESS, OR WORSENING VISION (ESPECIALLY VISION THAT WA INITIALLY BETTER) CALL [phone] OR [phone] DURING BUSINESS HOURS OR AFTER HOURS. ° °General Anesthesia, Adult, Care After °Refer to this sheet in the next few weeks. These instructions provide you with information on caring for yourself after your procedure. Your health care provider may also give you more specific instructions. Your treatment has been planned according to current medical practices, but problems sometimes occur. Call your health care provider if you have any problems or questions after your procedure. °WHAT TO EXPECT AFTER THE PROCEDURE °After the procedure, it is typical to experience: °· Sleepiness. °· Nausea and vomiting. °HOME CARE INSTRUCTIONS °· For the first 24 hours after general anesthesia: °¨ Have a responsible person with you. °¨ Do not drive a car. If you   are alone, do not take public transportation. °¨ Do not drink alcohol. °¨ Do not take medicine that has not been prescribed by your health care provider. °¨ Do not sign important papers or make important decisions. °¨ You may resume a normal diet and activities as directed by your health care provider. °· Change bandages (dressings) as directed. °· If you have questions or problems that seem related to general anesthesia, call the hospital and ask for  the anesthetist or anesthesiologist on call. °SEEK MEDICAL CARE IF: °· You have nausea and vomiting that continue the day after anesthesia. °· You develop a rash. °SEEK IMMEDIATE MEDICAL CARE IF:  °· You have difficulty breathing. °· You have chest pain. °· You have any allergic problems. °  °This information is not intended to replace advice given to you by your health care provider. Make sure you discuss any questions you have with your health care provider. °  °Document Released: 07/13/2000 Document Revised: 04/27/2014 Document Reviewed: 08/05/2011 °Elsevier Interactive Patient Education ©2016 Elsevier Inc. ° °

## 2015-07-09 ENCOUNTER — Ambulatory Visit: Payer: 59 | Admitting: Anesthesiology

## 2015-07-09 ENCOUNTER — Encounter
Admission: RE | Disposition: A | Discharge status: Home / Self Care | Payer: Self-pay | Source: Ambulatory Visit | Attending: Ophthalmology

## 2015-07-09 ENCOUNTER — Ambulatory Visit
Admission: RE | Admit: 2015-07-09 | Discharge: 2015-07-09 | Disposition: A | Discharge status: Home / Self Care | Payer: 59 | Source: Ambulatory Visit | Attending: Ophthalmology | Admitting: Ophthalmology

## 2015-07-09 DIAGNOSIS — H02834 Dermatochalasis of left upper eyelid: Secondary | ICD-10-CM | POA: Insufficient documentation

## 2015-07-09 DIAGNOSIS — Z9889 Other specified postprocedural states: Secondary | ICD-10-CM | POA: Diagnosis not present

## 2015-07-09 DIAGNOSIS — Z79899 Other long term (current) drug therapy: Secondary | ICD-10-CM | POA: Insufficient documentation

## 2015-07-09 DIAGNOSIS — F329 Major depressive disorder, single episode, unspecified: Secondary | ICD-10-CM | POA: Diagnosis not present

## 2015-07-09 DIAGNOSIS — H02831 Dermatochalasis of right upper eyelid: Secondary | ICD-10-CM | POA: Insufficient documentation

## 2015-07-09 DIAGNOSIS — M1990 Unspecified osteoarthritis, unspecified site: Secondary | ICD-10-CM | POA: Diagnosis not present

## 2015-07-09 DIAGNOSIS — Z85828 Personal history of other malignant neoplasm of skin: Secondary | ICD-10-CM | POA: Diagnosis not present

## 2015-07-09 DIAGNOSIS — M8580 Other specified disorders of bone density and structure, unspecified site: Secondary | ICD-10-CM | POA: Insufficient documentation

## 2015-07-09 DIAGNOSIS — K5790 Diverticulosis of intestine, part unspecified, without perforation or abscess without bleeding: Secondary | ICD-10-CM | POA: Insufficient documentation

## 2015-07-09 DIAGNOSIS — Z888 Allergy status to other drugs, medicaments and biological substances status: Secondary | ICD-10-CM | POA: Diagnosis not present

## 2015-07-09 DIAGNOSIS — J45990 Exercise induced bronchospasm: Secondary | ICD-10-CM | POA: Diagnosis not present

## 2015-07-09 DIAGNOSIS — Z90710 Acquired absence of both cervix and uterus: Secondary | ICD-10-CM | POA: Diagnosis not present

## 2015-07-09 DIAGNOSIS — Z79890 Hormone replacement therapy (postmenopausal): Secondary | ICD-10-CM | POA: Insufficient documentation

## 2015-07-09 DIAGNOSIS — Z7982 Long term (current) use of aspirin: Secondary | ICD-10-CM | POA: Diagnosis not present

## 2015-07-09 DIAGNOSIS — Z881 Allergy status to other antibiotic agents status: Secondary | ICD-10-CM | POA: Diagnosis not present

## 2015-07-09 DIAGNOSIS — Z8701 Personal history of pneumonia (recurrent): Secondary | ICD-10-CM | POA: Diagnosis not present

## 2015-07-09 DIAGNOSIS — H9190 Unspecified hearing loss, unspecified ear: Secondary | ICD-10-CM | POA: Diagnosis not present

## 2015-07-09 DIAGNOSIS — H02403 Unspecified ptosis of bilateral eyelids: Secondary | ICD-10-CM | POA: Insufficient documentation

## 2015-07-09 HISTORY — PX: BROW LIFT: SHX178

## 2015-07-09 HISTORY — DX: Motion sickness, initial encounter: T75.3XXA

## 2015-07-09 HISTORY — DX: Anemia, unspecified: D64.9

## 2015-07-09 HISTORY — PX: PTOSIS REPAIR: SHX6568

## 2015-07-09 SURGERY — BLEPHAROPLASTY
Anesthesia: Monitor Anesthesia Care | Site: Eye | Laterality: Bilateral | Wound class: Clean

## 2015-07-09 MED ORDER — BACITRACIN 500 UNIT/GM OP OINT
TOPICAL_OINTMENT | OPHTHALMIC | Status: DC | PRN
  Administered 2015-07-09: 1 via OPHTHALMIC

## 2015-07-09 MED ORDER — LIDOCAINE-EPINEPHRINE 2 %-1:100000 IJ SOLN
INTRAMUSCULAR | Status: DC | PRN
  Administered 2015-07-09: 2 mL via OPHTHALMIC

## 2015-07-09 MED ORDER — BSS IO SOLN
INTRAOCULAR | Status: DC | PRN
  Administered 2015-07-09: 30 mL via INTRAOCULAR

## 2015-07-09 MED ORDER — MIDAZOLAM HCL 2 MG/2ML IJ SOLN
INTRAMUSCULAR | Status: DC | PRN
  Administered 2015-07-09 (×2): 1 mg via INTRAVENOUS

## 2015-07-09 MED ORDER — ALFENTANIL 500 MCG/ML IJ INJ
INJECTION | INTRAMUSCULAR | Status: DC | PRN
  Administered 2015-07-09: 250 ug via INTRAVENOUS
  Administered 2015-07-09: 500 ug via INTRAVENOUS
  Administered 2015-07-09: 250 ug via INTRAVENOUS

## 2015-07-09 MED ORDER — TETRACAINE HCL 0.5 % OP SOLN
OPHTHALMIC | Status: DC | PRN
  Administered 2015-07-09: 6 [drp] via OPHTHALMIC

## 2015-07-09 MED ORDER — BACITRACIN 500 UNIT/GM OP OINT: TOPICAL_OINTMENT | OPHTHALMIC | Status: DC

## 2015-07-09 MED ORDER — LACTATED RINGERS IV SOLN
INTRAVENOUS | Status: DC
  Administered 2015-07-09: 08:00:00 via INTRAVENOUS

## 2015-07-09 MED ORDER — OXYCODONE-ACETAMINOPHEN 5-325 MG PO TABS: 1.0000 | ORAL_TABLET | ORAL | Status: DC | PRN

## 2015-07-09 SURGICAL SUPPLY — 36 items
APPLICATOR COTTON TIP WD 3 STR (MISCELLANEOUS) ×6 IMPLANT
BLADE SURG 15 STRL LF DISP TIS (BLADE) ×1 IMPLANT
BLADE SURG 15 STRL SS (BLADE) ×2
CORD BIP STRL DISP 12FT (MISCELLANEOUS) ×3 IMPLANT
DRAPE HEAD BAR (DRAPES) ×3 IMPLANT
GAUZE SPONGE 4X4 12PLY STRL (GAUZE/BANDAGES/DRESSINGS) ×3 IMPLANT
GAUZE SPONGE NON-WVN 2X2 STRL (MISCELLANEOUS) ×10 IMPLANT
GLOVE SURG LX 7.0 MICRO (GLOVE) ×4
GLOVE SURG LX STRL 7.0 MICRO (GLOVE) ×2 IMPLANT
MARKER SKIN XFINE TIP W/RULER (MISCELLANEOUS) ×3 IMPLANT
NEEDLE FILTER BLUNT 18X 1/2SAF (NEEDLE) ×2
NEEDLE FILTER BLUNT 18X1 1/2 (NEEDLE) ×1 IMPLANT
NEEDLE HYPO 30X.5 LL (NEEDLE) ×6 IMPLANT
PACK DRAPE NASAL/ENT (PACKS) ×3 IMPLANT
SOL PREP PVP 2OZ (MISCELLANEOUS) ×3
SOLUTION PREP PVP 2OZ (MISCELLANEOUS) ×1 IMPLANT
SPONGE VERSALON 2X2 STRL (MISCELLANEOUS) ×20
SUT CHROMIC 4-0 (SUTURE)
SUT CHROMIC 4-0 M2 12X2 ARM (SUTURE)
SUT CHROMIC 5 0 P 3 (SUTURE) IMPLANT
SUT ETHILON 4 0 CL P 3 (SUTURE) IMPLANT
SUT MERSILENE 4-0 S-2 (SUTURE) IMPLANT
SUT PDS AB 4-0 P3 18 (SUTURE) IMPLANT
SUT PLAIN GUT (SUTURE) ×3 IMPLANT
SUT PROLENE 5 0 P 3 (SUTURE) IMPLANT
SUT PROLENE 6 0 P 1 18 (SUTURE) ×6 IMPLANT
SUT SILK 4 0 G 3 (SUTURE) IMPLANT
SUT VIC AB 5-0 P-3 18X BRD (SUTURE) ×1 IMPLANT
SUT VIC AB 5-0 P3 18 (SUTURE) ×2
SUT VICRYL 6-0  S14 CTD (SUTURE)
SUT VICRYL 6-0 S14 CTD (SUTURE) IMPLANT
SUT VICRYL 7 0 TG140 8 (SUTURE) IMPLANT
SUTURE CHRMC 4-0 M2 12X2 ARM (SUTURE) IMPLANT
SYR 3ML LL SCALE MARK (SYRINGE) ×3 IMPLANT
SYRINGE 10CC LL (SYRINGE) ×3 IMPLANT
WATER STERILE IRR 500ML POUR (IV SOLUTION) ×3 IMPLANT

## 2015-07-09 NOTE — Anesthesia Procedure Notes (Signed)
Procedure Name: MAC Date/Time: 07/09/2015 7:47 AM Performed by: Cameron Ali Pre-anesthesia Checklist: Patient identified, Emergency Drugs available, Suction available, Timeout performed and Patient being monitored Patient Re-evaluated:Patient Re-evaluated prior to inductionOxygen Delivery Method: Nasal cannula Placement Confirmation: positive ETCO2

## 2015-07-09 NOTE — Op Note (Signed)
Preoperative Diagnosis:  1. Visually significant blepharoptosis both Upper Eyelid(s) 2. Visually significant dermatochalasis both Upper Eyelid(s)  Postoperative Diagnosis:  Same.  Procedure(s) Performed:   1. Blepharoptosis repair with levator aponeurosis advancement bilateral Upper Eyelid(s) 2. Upper eyelid blepharoplasty with excess skin excision  bilateral Upper Eyelid(s)  Teaching Surgeon: Philis Pique. Vickki Muff, M.D.  Assistants: none  Anesthesia: MAC  Specimens: None.  Estimated Blood Loss: Minimal.  Complications: None.  Operative Findings: None Dictated  Procedure:   Allergies were reviewed and the patient Cefdinir; Lisinopril; Meloxicam; Omnipen; Augmentin; and Clarithromycin.   After the risks, benefits, complications and alternatives were discussed with the patient, appropriate informed consent was obtained.  While seated in an upright position and looking in primary gaze, the mid pupillary line was marked on the upper eyelid margins bilaterally. The patient was then brought to the operating suite and reclined supine.  Timeout was conducted and the patient was sedated.  Local anesthetic consisting of a 50-50 mixture of 2% lidocaine with epinephrine and 0.75% bupivacaine with added Hylenex was injected subcutaneously to both upper eyelid(s). After adequate local was instilled, the patient was prepped and draped in the usual sterile fashion for eyelid surgery.   Attention was turned to the upper eyelids. A 46mm upper eyelid crease incision line was marked with calipers on both upper eyelid(s).  A pinch test was used to estimate the amount of excess skin to remove and this was marked in standard blepharoplasty style fashion. Attention was turned to the  right upper eyelid. A #15 blade was used to open the premarked incision line. A skin only flap was excised and hemostasis was obtained with bipolar cautery.   Westcott scissors were then used to transect through orbicularis down to  the tarsal plate. Epitarsus was dissected to create a smooth surface to suture to. Dissection was then carried superiorly in the plane between orbicularis and orbital septum. Once the preaponeurotic fat pocket was identified, the orbital septum was opened. This revealed the levator and its aponeurosis.    Attention was then turned to the opposite eyelid where the same procedure was performed in the same manner. Hemostasis was obtained with bipolar cautery throughout.   3 interrupted 6-0 Prolene sutures were then passed partial thickness through the tarsal plates of both upper eyelid(s). These sutures were placed in line with the mid pupillary, medial limbal, and lateral limbal lines. The sutures were fixed to the levator aponeurosis and adjusted until a nice lid height and contour were achieved. Once nice symmetry was achieved, the skin incisions were closed with a running 6-0 fast absorbing plain suture. The patient tolerated the procedure well.  Bacitracin ophthalmic ointment was applied to the incision site(s) followed by ice packs. The patient was taken to the recovery area where she recovered without difficulty.  Post-Op Plan/Instructions:  The patient was instructed to use ice packs frequently for the next 48 hours. She was instructed to use bacitracin ophthalmic ointment on her incisions 4 times a day for the next 12 to 14 days. Shewas given a prescription for Percocet for pain control should Tylenol not be effective. She was asked to to follow up at the [facility] in [place], [place] in 2 weeks' time or sooner as needed for problems.  Teaching Surgeon Attestation: None  Puneet Selden M. Vickki Muff, M.D. Attending,Ophthalmology

## 2015-07-09 NOTE — Anesthesia Postprocedure Evaluation (Signed)
Anesthesia Post Note  Patient: Gina Harper  Procedure(s) Performed: Procedure(s) (LRB): BLEPHAROPLASTY (Bilateral) PTOSIS REPAIR (Bilateral)  Patient location during evaluation: PACU Anesthesia Type: MAC Level of consciousness: awake and alert Pain management: pain level controlled Vital Signs Assessment: post-procedure vital signs reviewed and stable Respiratory status: spontaneous breathing, nonlabored ventilation, respiratory function stable and patient connected to nasal cannula oxygen Cardiovascular status: stable and blood pressure returned to baseline Anesthetic complications: no    Trecia Rogers

## 2015-07-09 NOTE — Interval H&P Note (Signed)
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 After consideration of risks, benefits and other options for treatment, the patient has consented to  Procedure(s): BLEPHAROPLASTY (Bilateral) PTOSIS REPAIR (Bilateral) as a surgical intervention .  The patient's history has been reviewed, patient examined, no change in status, stable for surgery.  I have reviewed the patient's chart and labs.  Questions were answered to the patient's satisfaction.     Vickki Muff, Charmaine Placido M

## 2015-07-09 NOTE — Transfer of Care (Signed)
Immediate Anesthesia Transfer of Care Note  Patient: Gina Harper  Procedure(s) Performed: Procedure(s): BLEPHAROPLASTY (Bilateral) PTOSIS REPAIR (Bilateral)  Patient Location: PACU  Anesthesia Type: MAC  Level of Consciousness: awake, alert  and patient cooperative  Airway and Oxygen Therapy: Patient Spontanous Breathing and Patient connected to supplemental oxygen  Post-op Assessment: Post-op Vital signs reviewed, Patient's Cardiovascular Status Stable, Respiratory Function Stable, Patent Airway and No signs of Nausea or vomiting  Post-op Vital Signs: Reviewed and stable  Complications: No apparent anesthesia complications

## 2015-07-09 NOTE — H&P (Signed)
  See H&P completed at [facility] center on 06/25/15 that is scanned into the chart.

## 2015-07-09 NOTE — Anesthesia Preprocedure Evaluation (Signed)
Anesthesia Evaluation  Patient identified by MRN, date of birth, ID band Patient awake    Reviewed: Allergy & Precautions, H&P , NPO status , Patient's Chart, lab work & pertinent test results, reviewed documented beta blocker date and time   Airway Mallampati: II  TM Distance: >3 FB Neck ROM: full    Dental no notable dental hx.    Pulmonary asthma , former smoker,    Pulmonary exam normal breath sounds clear to auscultation       Cardiovascular Exercise Tolerance: Good hypertension, Normal cardiovascular exam Rhythm:regular Rate:Normal     Neuro/Psych PSYCHIATRIC DISORDERS Abdominal epilepsy    GI/Hepatic Neg liver ROS, GERD  Controlled,  Endo/Other  negative endocrine ROS  Renal/GU negative Renal ROS  negative genitourinary   Musculoskeletal   Abdominal   Peds  Hematology negative hematology ROS (+)   Anesthesia Other Findings   Reproductive/Obstetrics negative OB ROS                             Anesthesia Physical Anesthesia Plan  ASA: II  Anesthesia Plan: MAC   Post-op Pain Management:    Induction: Intravenous  Airway Management Planned: Nasal Cannula  Additional Equipment:   Intra-op Plan:   Post-operative Plan:   Informed Consent: I have reviewed the patients History and Physical, chart, labs and discussed the procedure including the risks, benefits and alternatives for the proposed anesthesia with the patient or authorized representative who has indicated his/her understanding and acceptance.   Dental Advisory Given  Plan Discussed with: CRNA  Anesthesia Plan Comments:         Anesthesia Quick Evaluation

## 2015-07-10 ENCOUNTER — Encounter: Payer: Self-pay | Admitting: Ophthalmology

## 2015-10-01 ENCOUNTER — Other Ambulatory Visit: Payer: Self-pay | Admitting: Internal Medicine

## 2015-10-24 ENCOUNTER — Other Ambulatory Visit: Payer: Self-pay | Admitting: Internal Medicine

## 2015-10-24 DIAGNOSIS — Z1231 Encounter for screening mammogram for malignant neoplasm of breast: Secondary | ICD-10-CM

## 2015-11-18 ENCOUNTER — Ambulatory Visit (INDEPENDENT_AMBULATORY_CARE_PROVIDER_SITE_OTHER): Payer: 59 | Admitting: Family Medicine

## 2015-11-18 ENCOUNTER — Encounter: Payer: Self-pay | Admitting: Family Medicine

## 2015-11-18 DIAGNOSIS — G629 Polyneuropathy, unspecified: Secondary | ICD-10-CM | POA: Diagnosis not present

## 2015-11-18 DIAGNOSIS — I872 Venous insufficiency (chronic) (peripheral): Secondary | ICD-10-CM

## 2015-11-18 DIAGNOSIS — F324 Major depressive disorder, single episode, in partial remission: Secondary | ICD-10-CM

## 2015-11-18 DIAGNOSIS — E7800 Pure hypercholesterolemia, unspecified: Secondary | ICD-10-CM | POA: Diagnosis not present

## 2015-11-18 DIAGNOSIS — E538 Deficiency of other specified B group vitamins: Secondary | ICD-10-CM | POA: Diagnosis not present

## 2015-11-18 DIAGNOSIS — J302 Other seasonal allergic rhinitis: Secondary | ICD-10-CM

## 2015-11-18 DIAGNOSIS — N952 Postmenopausal atrophic vaginitis: Secondary | ICD-10-CM

## 2015-11-18 DIAGNOSIS — I10 Essential (primary) hypertension: Secondary | ICD-10-CM

## 2015-11-18 DIAGNOSIS — R970 Elevated carcinoembryonic antigen [CEA]: Secondary | ICD-10-CM

## 2015-11-18 DIAGNOSIS — J45990 Exercise induced bronchospasm: Secondary | ICD-10-CM

## 2015-11-18 NOTE — Assessment & Plan Note (Signed)
Using estrace in limited fashion.

## 2015-11-18 NOTE — Assessment & Plan Note (Signed)
Followed by Dr. Lucky Cowboy. Considering ablation for venous reflux on right leg.

## 2015-11-18 NOTE — Assessment & Plan Note (Signed)
Stable control on fluoxetine 

## 2015-11-18 NOTE — Progress Notes (Signed)
Pre visit review using our clinic review tool, if applicable. No additional management support is needed unless otherwise documented below in the visit note. 

## 2015-11-18 NOTE — Progress Notes (Signed)
   Subjective:    Patient ID: Gina Harper, female    DOB: 09-28-1950, 65 y.o.   MRN: [id number]  HPI  65 year old female presents to establish care. Previously seen Dr. Gilford Rile PCP. Last CPX: 12/2014  Last GYN: partial hysterectomy for noncancer reason.  Elevated Cholesterol: Well controlled on no med. Lab Results  Component Value Date   CHOL 172 09/07/2014   HDL 49.70 09/07/2014   LDLCALC 107 (H) 09/07/2014   TRIG 77.0 09/07/2014   CHOLHDL 3 09/07/2014  Using medications without problems: Muscle aches:  Diet compliance: Exercise: Other complaints:  Prediabetes: stable control.  Hypertension:    Well controlled on HCTZ alone. Was on ACEI in past. BP Readings from Last 3 Encounters:  11/18/15 122/62  07/09/15 136/72  07/02/15 113/71  Using medication without problems or lightheadedness: None Chest pain with exertion:None Edema:None Short of breath:None Average home BPs: 120/70 Other issues: Diet: healthy Exercise: 3 times a week, gym,  Idiopathic peripheral neuropathy: seen by Neuro in past. No clear cause per lab work. Continued despite B12 supplement. Tolerable at this time on no med.   She has vasovagal episodes at time. Afterward she has nausea and vice versus. Uses phenergan prn.  Social History /Family History/Past Medical History reviewed and updated if needed.  Review of Systems  Constitutional: Negative for fatigue and fever.  HENT: Negative for ear pain.   Eyes: Negative for pain.  Respiratory: Negative for chest tightness and shortness of breath.   Cardiovascular: Negative for chest pain, palpitations and leg swelling.  Gastrointestinal: Negative for abdominal pain.  Genitourinary: Negative for dysuria.       Objective:   Physical Exam  Constitutional: Vital signs are normal. She appears well-developed and well-nourished. She is cooperative.  Non-toxic appearance. She does not appear ill. No distress.  HENT:  Head: Normocephalic.  Right Ear:  Hearing, tympanic membrane, external ear and ear canal normal. Tympanic membrane is not erythematous, not retracted and not bulging.  Left Ear: Hearing, tympanic membrane, external ear and ear canal normal. Tympanic membrane is not erythematous, not retracted and not bulging.  Nose: No mucosal edema or rhinorrhea. Right sinus exhibits no maxillary sinus tenderness and no frontal sinus tenderness. Left sinus exhibits no maxillary sinus tenderness and no frontal sinus tenderness.  Mouth/Throat: Uvula is midline, oropharynx is clear and moist and mucous membranes are normal.  Eyes: Conjunctivae, EOM and lids are normal. Pupils are equal, round, and reactive to light. Lids are everted and swept, no foreign bodies found.  Neck: Trachea normal and normal range of motion. Neck supple. Carotid bruit is not present. No thyroid mass and no thyromegaly present.  Cardiovascular: Normal rate, regular rhythm, S1 normal, S2 normal, normal heart sounds, intact distal pulses and normal pulses.  Exam reveals no gallop and no friction rub.   No murmur heard. Pulmonary/Chest: Effort normal and breath sounds normal. No tachypnea. No respiratory distress. She has no decreased breath sounds. She has no wheezes. She has no rhonchi. She has no rales.  Abdominal: Soft. Normal appearance and bowel sounds are normal. There is no tenderness.  Neurological: She is alert.  Skin: Skin is warm, dry and intact. No rash noted.  Psychiatric: Her speech is normal and behavior is normal. Judgment and thought content normal. Her mood appears not anxious. Cognition and memory are normal. She does not exhibit a depressed mood.          Assessment & Plan:

## 2015-11-18 NOTE — Assessment & Plan Note (Signed)
Well controlled on no med. 

## 2015-11-18 NOTE — Assessment & Plan Note (Addendum)
Well controlled. Continue current medication. Encouraged exercise, weight loss, healthy eating habits. On potassium longterm for low potassium likely HCTZ.

## 2015-11-18 NOTE — Assessment & Plan Note (Signed)
Use albuterol as needed, rarely.

## 2015-11-18 NOTE — Assessment & Plan Note (Signed)
Tolerable at this time on no med. 

## 2015-11-18 NOTE — Assessment & Plan Note (Signed)
On B12 shots monthly ( given at home), well controlled.

## 2015-11-18 NOTE — Assessment & Plan Note (Signed)
Stable control on zytec.

## 2015-12-05 ENCOUNTER — Other Ambulatory Visit: Payer: Self-pay | Admitting: Internal Medicine

## 2015-12-05 ENCOUNTER — Ambulatory Visit
Admission: RE | Admit: 2015-12-05 | Discharge: 2015-12-05 | Disposition: A | Discharge status: Home / Self Care | Payer: 59 | Source: Ambulatory Visit | Attending: Internal Medicine | Admitting: Internal Medicine

## 2015-12-05 DIAGNOSIS — Z1231 Encounter for screening mammogram for malignant neoplasm of breast: Secondary | ICD-10-CM | POA: Diagnosis not present

## 2015-12-08 ENCOUNTER — Other Ambulatory Visit: Payer: Self-pay | Admitting: Internal Medicine

## 2015-12-08 DIAGNOSIS — R928 Other abnormal and inconclusive findings on diagnostic imaging of breast: Secondary | ICD-10-CM

## 2015-12-08 NOTE — Assessment & Plan Note (Signed)
additional images and [place] ordered

## 2015-12-09 ENCOUNTER — Telehealth: Payer: Self-pay | Admitting: *Deleted

## 2015-12-09 ENCOUNTER — Other Ambulatory Visit: Payer: Self-pay | Admitting: Family Medicine

## 2015-12-09 DIAGNOSIS — N6310 Unspecified lump in the right breast, unspecified quadrant: Secondary | ICD-10-CM

## 2015-12-09 NOTE — Telephone Encounter (Signed)
Patient requested mammogram results

## 2015-12-09 NOTE — Telephone Encounter (Signed)
LM for patient to return call.

## 2015-12-19 NOTE — Telephone Encounter (Signed)
Patient never called back.

## 2015-12-20 ENCOUNTER — Ambulatory Visit
Admission: RE | Admit: 2015-12-20 | Discharge: 2015-12-20 | Disposition: A | Discharge status: Home / Self Care | Payer: 59 | Source: Ambulatory Visit | Attending: Family Medicine | Admitting: Family Medicine

## 2015-12-20 DIAGNOSIS — N6310 Unspecified lump in the right breast, unspecified quadrant: Secondary | ICD-10-CM

## 2015-12-20 DIAGNOSIS — N63 Unspecified lump in breast: Secondary | ICD-10-CM | POA: Insufficient documentation

## 2016-01-06 ENCOUNTER — Ambulatory Visit (INDEPENDENT_AMBULATORY_CARE_PROVIDER_SITE_OTHER): Payer: 59 | Admitting: Primary Care

## 2016-01-06 ENCOUNTER — Encounter: Payer: Self-pay | Admitting: Primary Care

## 2016-01-06 VITALS — BP 122/84 | HR 67 | Temp 98.8°F | Ht 62.5 in | Wt 156.8 lb

## 2016-01-06 DIAGNOSIS — R0981 Nasal congestion: Secondary | ICD-10-CM

## 2016-01-06 NOTE — Patient Instructions (Signed)
Your symptoms are related to a viral infection that will pass on its own.  Continue Zyrtec once daily.  Nasal Congestion/Sinus Pressure: Try using Flonase (fluticasone) nasal spray. Instill 2 sprays in each nostril once daily.   Cough/Congestion: Try taking Mucinex DM. This will help loosen up the mucous in your chest. Ensure you take this medication with a full glass of water.  Please notify me if you develop persistent fevers of 101, start coughing up green mucous, notice increased fatigue or weakness, or feel worse after 1 week of onset of symptoms.   Increase consumption of water intake and rest.  It was a pleasure meeting you!

## 2016-01-06 NOTE — Progress Notes (Signed)
Pre visit review using our clinic review tool, if applicable. No additional management support is needed unless otherwise documented below in the visit note. 

## 2016-01-06 NOTE — Progress Notes (Signed)
Subjective:    Patient ID: Gina Harper, female    DOB: 08-05-1950, 65 y.o.   MRN: [id number]  HPI  Gina Harper is a 65 year old female with a history of seasonal allergies who presents today with a chief complaint of nasal congestion. She also reports cough, sore throat, sinus pressure. Her symptoms have been present for the past 3 days. Her cough is non productive. She's been taking Zyrtec. She denies fevers, ear pain. She will be leaving for the beach in 1 week and is nervous she is getting sick.   Review of Systems  Constitutional: Positive for chills and fatigue. Negative for fever.  HENT: Positive for congestion, sinus pressure and sore throat. Negative for ear pain.   Respiratory: Positive for cough. Negative for shortness of breath and wheezing.   Cardiovascular: Negative for chest pain.       Past Medical History:  Diagnosis Date  . 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 Allergic rhinitis, cause unspecified   . Allergy   . Alopecia, unspecified   . Anemia    pernicious anemia  . Arthritis    hands, knees, hips  . Chicken pox   . Depressive disorder, not elsewhere classified   . Disorder of bone and cartilage, unspecified   . Esophageal reflux   . GERD (gastroesophageal reflux disease)   . Hepatitis   . History of shingles   . Insomnia, unspecified   . Measles    red measles  . Motion sickness   . Mumps   . Other abnormal glucose   . Palpitations   . Syncope, vasovagal   . Tobacco use disorder   . Unspecified asthma(493.90)    exercise induced. Never hospitalized  . Unspecified essential hypertension   . Unspecified hearing loss   . Unspecified pruritic disorder      Social History   Social History  . Marital status: Married    Spouse name: N/A  . Number of children: 1  . Years of education: college   Occupational History  . 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   Social History Main Topics  . Smoking  status: Former Smoker    Packs/day: 1.00    Years: 17.00    Types: Cigarettes    Quit date: 05/22/1988  . Smokeless tobacco: Not on file     Comment: Quit 22 years ago, 1990  . Alcohol use 3.6 oz/week    6 Standard drinks or equivalent per week     Comment: moderate white wine 5 glasses per week  . Drug use: No  . Sexual activity: Yes    Partners: Male    Birth control/ protection: Post-menopausal, Surgical   Other Topics Concern  . Not on file   Social History Narrative      Marital status:  Married x 40 years, happily married; no domestic abuse.      Children:  1 child; 3 grandchildren local.      Employment:  Retired in 09/2012; [place] x 21 years; happy.  Pre-admission testing.      Tobacco:  Previous smoker. Quit 1990      Alcohol:  2 glasses of wine three nights per week.      Drugs; none      Exercise:  Treadmill, elliptical, Zumba.  [employer] 3x per week.      Seatbelt:  Always uses seat belts.       +  Smoke alarm and carbon monoxide detector in the home.       Guns:  Guns stored in locked cabinet.       Caffeine use: Coffee, Tea, Carbonated beverages, 3 servings / day.                     Past Surgical History:  Procedure Laterality Date  . bladder tact  1995  . BROW LIFT Bilateral 07/09/2015   Procedure: BLEPHAROPLASTY;  Surgeon: Karle Starch, MD;  Location: [place];  Service: Ophthalmology;  Laterality: Bilateral;  . BUNIONECTOMY  [id number]   bilateral  . colonoscopy  04/20/2004   no polyps; normal.  Repeat in 10 years.  [place].  Marland Kitchen COLONOSCOPY WITH PROPOFOL N/A 05/20/2015   Procedure: COLONOSCOPY WITH PROPOFOL;  Surgeon: Lollie Sails, MD;  Location: [facility] ENDOSCOPY;  Service: Endoscopy;  Laterality: N/A;  . COSMETIC SURGERY     rhinoplasty  . ESOPHAGOGASTRODUODENOSCOPY  03/30/2005  . PTOSIS REPAIR Bilateral 07/09/2015   Procedure: PTOSIS REPAIR;  Surgeon: Karle Starch, MD;  Location: [place];  Service: Ophthalmology;  Laterality: Bilateral;    . rhinoplasty with septoplasty    . TONSILLECTOMY AND ADENOIDECTOMY    . TUBAL LIGATION    . VAGINAL HYSTERECTOMY  1997   menorrhagia ovaries intact    Family History  Problem Relation Age of Onset  . Cancer Mother     Lung, Bladder  . Stroke Father 49    TIAs  . Heart disease Father 69    AMI x 2.  . Colon polyps Father   . Thyroid disease Sister   . Cancer Brother     lung  . Breast cancer Maternal Aunt 60    Allergies  Allergen Reactions  . Cefdinir Hives  . Lisinopril Cough and Other (See Comments)  . Meloxicam Swelling  . Omnipen [Ampicillin] Other (See Comments)  . Augmentin [Amoxicillin-Pot Clavulanate] Rash  . Clarithromycin Rash    Current Outpatient Prescriptions on File Prior to Visit  Medication Sig Dispense Refill  . albuterol (PROAIR HFA) 108 (90 BASE) MCG/ACT inhaler Inhale 2 puffs into the lungs every 6 (six) hours as needed. Dispense:  3 inhalers 18 g 1  . cetirizine (ZYRTEC) 10 MG tablet Take 10 mg by mouth as needed.     . Cholecalciferol (VITAMIN D3) 2000 UNITS TABS Take by mouth daily.     . cyanocobalamin (,VITAMIN B-12,) 1000 MCG/ML injection Inject 1 mL (1,000 mcg total) into the muscle every 30 (thirty) days. 10 mL 0  . estradiol (ESTRACE) 0.1 MG/GM vaginal cream Use as diretected (Patient taking differently: Place vaginally as needed. Use as diretected) 42.5 g 11  . FLUoxetine (PROZAC) 20 MG capsule Take 1 capsule by mouth  daily 90 capsule 2  . hydrochlorothiazide (HYDRODIURIL) 25 MG tablet Take 1 tablet by mouth  daily 90 tablet 3  . Multiple Vitamin (MULTI-VITAMINS) TABS Take by mouth daily.    Marland Kitchen omeprazole (PRILOSEC) 20 MG capsule Take 1 capsule (20 mg total) by mouth daily. (Patient taking differently: Take 20 mg by mouth as needed. ) 90 capsule 3  . Potassium Gluconate 550 MG TABS Take by mouth daily.     . promethazine (PHENERGAN) 25 MG suppository Place 1 suppository (25 mg total) rectally every 6 (six) hours as needed. 12 each 1  .  promethazine (PHENERGAN) 25 MG tablet Take 1 tablet (25 mg total) by mouth every 6 (six) hours as needed. 90 tablet 1  .  SYRINGE/NEEDLE, DISP, 1 ML (B-D SYRINGE/NEEDLE 1CC/25GX5/8) 25G X 5/8" 1 ML MISC Use as directed 100 each 3   No current facility-administered medications on file prior to visit.     BP 122/84   Pulse 67   Temp 98.8 F (37.1 C) (Oral)   Ht 5' 2.5" (1.588 m)   Wt 156 lb 12.8 oz (71.1 kg)   SpO2 97%   BMI 28.22 kg/m    Objective:   Physical Exam  Constitutional: She appears well-nourished.  HENT:  Right Ear: Tympanic membrane and ear canal normal.  Left Ear: Tympanic membrane and ear canal normal.  Nose: Mucosal edema present. Right sinus exhibits maxillary sinus tenderness and frontal sinus tenderness. Left sinus exhibits no maxillary sinus tenderness and no frontal sinus tenderness.  Mouth/Throat: Oropharynx is clear and moist.  Eyes: Conjunctivae are normal.  Neck: Neck supple.  Cardiovascular: Normal rate and regular rhythm.   Pulmonary/Chest: Effort normal and breath sounds normal. She has no wheezes. She has no rales.  Lymphadenopathy:    She has no cervical adenopathy.  Skin: Skin is warm and dry.          Assessment & Plan:  URI:  Nasal congestion, sore throat, cough, fatigue x 3 days. Some improvement with Zyrtec. Exam today with clear lungs, no evidence of bacterial involvement. Mild nasal mucosal edema. Vitals stable. Suspect viral involvement and will treat with conservative measures.  Continue Zyrtec. Start Flonase and Mucinex. Fluids, rest. Return precautions provided.   Sheral Flow, NP

## 2016-01-07 ENCOUNTER — Encounter: Payer: Self-pay | Admitting: Family Medicine

## 2016-01-07 ENCOUNTER — Encounter: Payer: Self-pay | Admitting: Internal Medicine

## 2016-01-09 ENCOUNTER — Other Ambulatory Visit (INDEPENDENT_AMBULATORY_CARE_PROVIDER_SITE_OTHER): Payer: 59

## 2016-01-09 ENCOUNTER — Telehealth: Payer: Self-pay | Admitting: Family Medicine

## 2016-01-09 DIAGNOSIS — R7303 Prediabetes: Secondary | ICD-10-CM

## 2016-01-09 DIAGNOSIS — E7800 Pure hypercholesterolemia, unspecified: Secondary | ICD-10-CM

## 2016-01-09 DIAGNOSIS — Z1159 Encounter for screening for other viral diseases: Secondary | ICD-10-CM

## 2016-01-09 LAB — COMPREHENSIVE METABOLIC PANEL
ALBUMIN: 4.2 g/dL (ref 3.5–5.2)
ALT: 16 U/L (ref 0–35)
AST: 20 U/L (ref 0–37)
Alkaline Phosphatase: 65 U/L (ref 39–117)
BUN: 16 mg/dL (ref 6–23)
CHLORIDE: 105 meq/L (ref 96–112)
CO2: 33 meq/L — AB (ref 19–32)
CREATININE: 0.75 mg/dL (ref 0.40–1.20)
Calcium: 9.2 mg/dL (ref 8.4–10.5)
GFR: 82.17 mL/min (ref >60.00)
GLUCOSE: 92 mg/dL (ref 70–99)
Potassium: 3.8 mEq/L (ref 3.5–5.1)
SODIUM: 141 meq/L (ref 135–145)
Total Bilirubin: 0.4 mg/dL (ref 0.2–1.2)
Total Protein: 7.3 g/dL (ref 6.0–8.3)

## 2016-01-09 LAB — LIPID PANEL
CHOL/HDL RATIO: 3
Cholesterol: 163 mg/dL (ref 0–200)
HDL: 51.7 mg/dL (ref >39.00)
LDL CALC: 99 mg/dL (ref 0–99)
NonHDL: 110.86
Triglycerides: 61 mg/dL (ref 0.0–149.0)
VLDL: 12.2 mg/dL (ref 0.0–40.0)

## 2016-01-09 LAB — HEMOGLOBIN A1C: HEMOGLOBIN A1C: 5.4 % (ref 4.6–6.5)

## 2016-01-09 NOTE — Telephone Encounter (Signed)
-----   Message from Marchia Bond sent at 01/03/2016  5:01 PM EDT ----- Regarding: Cpx labs Thurs 9/21, need orders. Thanks:-) Please order  future cpx labs for pt's upcoming lab appt. Thanks Aniceto Boss

## 2016-01-09 NOTE — Addendum Note (Signed)
Addended by: Marchia Bond on: 01/09/2016 09:12 AM   Modules accepted: Orders

## 2016-01-10 ENCOUNTER — Ambulatory Visit: Payer: Self-pay | Admitting: Family Medicine

## 2016-01-10 LAB — HEPATITIS C ANTIBODY: HCV AB: NEGATIVE

## 2016-01-14 ENCOUNTER — Ambulatory Visit (INDEPENDENT_AMBULATORY_CARE_PROVIDER_SITE_OTHER): Payer: 59 | Admitting: Family Medicine

## 2016-01-14 ENCOUNTER — Encounter: Payer: Self-pay | Admitting: Family Medicine

## 2016-01-14 VITALS — BP 114/64 | HR 60 | Temp 98.0°F | Ht 62.5 in | Wt 156.5 lb

## 2016-01-14 DIAGNOSIS — R7303 Prediabetes: Secondary | ICD-10-CM

## 2016-01-14 DIAGNOSIS — Z0000 Encounter for general adult medical examination without abnormal findings: Secondary | ICD-10-CM

## 2016-01-14 DIAGNOSIS — M8580 Other specified disorders of bone density and structure, unspecified site: Secondary | ICD-10-CM | POA: Insufficient documentation

## 2016-01-14 DIAGNOSIS — Z23 Encounter for immunization: Secondary | ICD-10-CM | POA: Diagnosis not present

## 2016-01-14 DIAGNOSIS — I10 Essential (primary) hypertension: Secondary | ICD-10-CM

## 2016-01-14 DIAGNOSIS — E7800 Pure hypercholesterolemia, unspecified: Secondary | ICD-10-CM | POA: Diagnosis not present

## 2016-01-14 NOTE — Assessment & Plan Note (Signed)
Well controlled. Continue current medication.  

## 2016-01-14 NOTE — Progress Notes (Signed)
Subjective:    Patient ID: Gina Harper, female    DOB: 1951-03-18, 65 y.o.   MRN: [id number]  HPI    65 year old female presents for annual exam.   She was seen last week for congestion, she has begun improving . Use zyrtec, flonase, mucinex. No new fever.  Last CPX: 12/2014  Last GYN: partial hysterectomy for noncancer reason.  Elevated Cholesterol: Well controlled on no med. Lab Results  Component Value Date   CHOL 163 01/09/2016   HDL 51.70 01/09/2016   LDLCALC 99 01/09/2016   TRIG 61.0 01/09/2016   CHOLHDL 3 01/09/2016  Using medications without problems: None Muscle aches:  None Diet compliance: low carb diet Exercise: 3 times a week. Other complaints:  Prediabetes: stable control. She has lost 20 lbs in last year. Lab Results  Component Value Date   HGBA1C 5.4 01/09/2016    Hypertension:    Well controlled on HCTZ alone. Was on ACEI in past. BP Readings from Last 3 Encounters:  01/14/16 114/64  01/06/16 122/84  11/18/15 122/62   Using medication without problems or lightheadedness: None Chest pain with exertion:None Edema:None Short of breath:None Average home BPs: 120/70 Other issues: Diet: healthy Exercise: 3 times a week, gym,  Idiopathic peripheral neuropathy: seen by Neuro in past. No clear cause per lab work. Continued despite B12 supplement. Tolerable at this time on no med.  Social History /Family History/Past Medical History reviewed and updated if needed.   Review of Systems  Constitutional: Negative for fatigue and fever.  HENT: Positive for postnasal drip and sinus pressure. Negative for congestion.   Eyes: Negative for pain.  Respiratory: Negative for cough and shortness of breath.   Cardiovascular: Negative for chest pain, palpitations and leg swelling.  Gastrointestinal: Negative for abdominal pain.  Genitourinary: Negative for dysuria and vaginal bleeding.  Musculoskeletal: Positive for back pain.  Neurological:  Negative for syncope, light-headedness and headaches.  Psychiatric/Behavioral: Negative for dysphoric mood.       Objective:   Physical Exam  Constitutional: Vital signs are normal. She appears well-developed and well-nourished. She is cooperative.  Non-toxic appearance. She does not appear ill. No distress.  HENT:  Head: Normocephalic.  Right Ear: Hearing, tympanic membrane, external ear and ear canal normal.  Left Ear: Hearing, tympanic membrane, external ear and ear canal normal.  Nose: Nose normal.  Eyes: Conjunctivae, EOM and lids are normal. Pupils are equal, round, and reactive to light. Lids are everted and swept, no foreign bodies found.  Neck: Trachea normal and normal range of motion. Neck supple. Carotid bruit is not present. No thyroid mass and no thyromegaly present.  Cardiovascular: Normal rate, regular rhythm, S1 normal, S2 normal, normal heart sounds and intact distal pulses.  Exam reveals no gallop.   No murmur heard. Pulmonary/Chest: Effort normal and breath sounds normal. No respiratory distress. She has no wheezes. She has no rhonchi. She has no rales.  Abdominal: Soft. Normal appearance and bowel sounds are normal. She exhibits no distension, no fluid wave, no abdominal bruit and no mass. There is no hepatosplenomegaly. There is no tenderness. There is no rebound, no guarding and no CVA tenderness. No hernia.  Genitourinary: Vagina normal. No breast swelling, tenderness, discharge or bleeding. Pelvic exam was performed with patient supine. There is no rash, tenderness or lesion on the right labia. There is no rash, tenderness or lesion on the left labia. Right adnexum displays no mass, no tenderness and no fullness. Left adnexum displays no  mass, no tenderness and no fullness.  Genitourinary Comments: NO PAP  Lymphadenopathy:    She has no cervical adenopathy.    She has no axillary adenopathy.  Neurological: She is alert. She has normal strength. No cranial nerve  deficit or sensory deficit.  Skin: Skin is warm, dry and intact. No rash noted.  Psychiatric: Her speech is normal and behavior is normal. Judgment normal. Her mood appears not anxious. Cognition and memory are normal. She does not exhibit a depressed mood.          Assessment & Plan:  The patient's preventative maintenance and recommended screening tests for an annual wellness exam were reviewed in full today. Brought up to date unless services declined.  Counselled on the importance of diet, exercise, and its role in overall health and mortality. The patient's FH and SH was reviewed, including their home life, tobacco status, and drug and alcohol status.   PAP not indicated given partial hysterectomy, rec q 2 year DVE.   Mammogram 11/2015 abn, then nml diagnostic.  Colonoscopy 05/20/2015 polyp repeat in 10 years  Flu vaccine given today, plans to get PCV23 at pharmacy once at 1 year point.   Hep c neg   DEXA: 2015 T -2.1 spine. Due.

## 2016-01-14 NOTE — Assessment & Plan Note (Signed)
Spine T-2.1  Due for repeat

## 2016-01-14 NOTE — Assessment & Plan Note (Signed)
Resolved with weight loss and lifestyle changes.

## 2016-01-14 NOTE — Assessment & Plan Note (Signed)
Well controlled on no med. 

## 2016-01-14 NOTE — Progress Notes (Signed)
Pre visit review using our clinic review tool, if applicable. No additional management support is needed unless otherwise documented below in the visit note. 

## 2016-01-14 NOTE — Addendum Note (Signed)
Addended by: Emelia Salisbury C on: 01/14/2016 09:48 AM   Modules accepted: Orders

## 2016-01-14 NOTE — Patient Instructions (Addendum)
Get second PNA vaccine ( PCV23) at pharmacy. Stop at front desk on way out to set up bone density.

## 2016-02-06 ENCOUNTER — Ambulatory Visit
Admission: RE | Admit: 2016-02-06 | Discharge: 2016-02-06 | Disposition: A | Discharge status: Home / Self Care | Payer: 59 | Source: Ambulatory Visit | Attending: Family Medicine | Admitting: Family Medicine

## 2016-02-06 DIAGNOSIS — Z13820 Encounter for screening for osteoporosis: Secondary | ICD-10-CM | POA: Diagnosis present

## 2016-02-06 DIAGNOSIS — M8580 Other specified disorders of bone density and structure, unspecified site: Secondary | ICD-10-CM | POA: Insufficient documentation

## 2016-04-23 ENCOUNTER — Ambulatory Visit (INDEPENDENT_AMBULATORY_CARE_PROVIDER_SITE_OTHER): Payer: 59 | Admitting: Family Medicine

## 2016-04-23 ENCOUNTER — Encounter: Payer: Self-pay | Admitting: Family Medicine

## 2016-04-23 VITALS — BP 116/62 | HR 59 | Temp 97.4°F | Wt 162.5 lb

## 2016-04-23 DIAGNOSIS — L03011 Cellulitis of right finger: Secondary | ICD-10-CM | POA: Diagnosis not present

## 2016-04-23 MED ORDER — DOXYCYCLINE HYCLATE 100 MG PO CAPS: 100.0000 mg | ORAL_CAPSULE | Freq: Two times a day (BID) | ORAL | 0 refills | Status: DC

## 2016-04-23 NOTE — Progress Notes (Signed)
Pre visit review using our clinic review tool, if applicable. No additional management support is needed unless otherwise documented below in the visit note. 

## 2016-04-23 NOTE — Patient Instructions (Signed)
Please do warm compresses 3-4 times a day until better If you develop fever, streaking, worsening pain/swelling despite antibiotic, please come back in or go to the ER Acetaminophen or ibuprofen as needed for pain  Paronychia Introduction Paronychia is an infection of the skin that surrounds a nail. It usually affects the skin around a fingernail, but it may also occur near a toenail. It often causes pain and swelling around the nail. This condition may come on suddenly or develop over a longer period. In some cases, a collection of pus (abscess) can form near or under the nail. Usually, paronychia is not serious and it clears up with treatment. What are the causes? This condition may be caused by bacteria or fungi. It is commonly caused by either Streptococcus or Staphylococcus bacteria. The bacteria or fungi often cause the infection by getting into the affected area through an opening in the skin, such as a cut or a hangnail. What increases the risk? This condition is more likely to develop in:  People who get their hands wet often, such as those who work as Designer, industrial/product, bartenders, or nurses.  People who bite their fingernails or suck their thumbs.  People who trim their nails too short.  People who have hangnails or injured fingertips.  People who get manicures.  People who have diabetes. What are the signs or symptoms? Symptoms of this condition include:  Redness and swelling of the skin near the nail.  Tenderness around the nail when you touch the area.  Pus-filled bumps under the cuticle. The cuticle is the skin at the base or sides of the nail.  Fluid or pus under the nail.  Throbbing pain in the area. How is this diagnosed? This condition is usually diagnosed with a physical exam. In some cases, a sample of pus may be taken from an abscess to be tested in a lab. This can help to determine what type of bacteria or fungi is causing the condition. How is this  treated? Treatment for this condition depends on the cause and severity of the condition. If the condition is mild, it may clear up on its own in a few days. Your health care provider may recommend soaking the affected area in warm water a few times a day. When treatment is needed, the options may include:  Antibiotic medicine, if the condition is caused by a bacterial infection.  Antifungal medicine, if the condition is caused by a fungal infection.  Incision and drainage, if an abscess is present. In this procedure, the health care provider will cut open the abscess so the pus can drain out. Follow these instructions at home:  Soak the affected area in warm water if directed to do so by your health care provider. You may be told to do this for 20 minutes, 2-3 times a day. Keep the area dry in between soakings.  Take medicines only as directed by your health care provider.  If you were prescribed an antibiotic medicine, finish all of it even if you start to feel better.  Keep the affected area clean.  Do not try to drain a fluid-filled bump yourself.  If you will be washing dishes or performing other tasks that require your hands to get wet, wear rubber gloves. You should also wear gloves if your hands might come in contact with irritating substances, such as cleaners or chemicals.  Follow your health care provider's instructions about:  Wound care.  Bandage (dressing) changes and removal. Contact a health  care provider if:  Your symptoms get worse or do not improve with treatment.  You have a fever or chills.  You have redness spreading from the affected area.  You have continued or increased fluid, blood, or pus coming from the affected area.  Your finger or knuckle becomes swollen or is difficult to move. This information is not intended to replace advice given to you by your health care provider. Make sure you discuss any questions you have with your health care  provider. Document Released: 09/30/2000 Document Revised: 09/12/2015 Document Reviewed: 03/14/2014  2017 Elsevier

## 2016-04-23 NOTE — Progress Notes (Signed)
Subjective:    Patient ID: Gina Harper, female    DOB: [birth year], 66 y.o.   MRN: [id number]  HPI This is a 66 yo female who presents today with right index finger pain around medial nail. Symptoms x 5 days, getting worse. Sensitive to touch, swollen. No fever or drainage.   Past Medical History:  Diagnosis Date  . 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 Allergic rhinitis, cause unspecified   . Allergy   . Alopecia, unspecified   . Anemia    pernicious anemia  . Arthritis    hands, knees, hips  . Chicken pox   . Depressive disorder, not elsewhere classified   . Disorder of bone and cartilage, unspecified   . Esophageal reflux   . GERD (gastroesophageal reflux disease)   . Hepatitis   . History of shingles   . Insomnia, unspecified   . Measles    red measles  . Motion sickness   . Mumps   . Other abnormal glucose   . Palpitations   . Syncope, vasovagal   . Tobacco use disorder   . Unspecified asthma(493.90)    exercise induced. Never hospitalized  . Unspecified essential hypertension   . Unspecified hearing loss   . Unspecified pruritic disorder    Past Surgical History:  Procedure Laterality Date  . bladder tact  1995  . BROW LIFT Bilateral 07/09/2015   Procedure: BLEPHAROPLASTY;  Surgeon: Karle Starch, MD;  Location: [facility];  Service: Ophthalmology;  Laterality: Bilateral;  . BUNIONECTOMY  [id number]   bilateral  . colonoscopy  04/20/2004   no polyps; normal.  Repeat in 10 years.  [place].  Marland Kitchen COLONOSCOPY WITH PROPOFOL N/A 05/20/2015   Procedure: COLONOSCOPY WITH PROPOFOL;  Surgeon: Lollie Sails, MD;  Location: [facility] ENDOSCOPY;  Service: Endoscopy;  Laterality: N/A;  . COSMETIC SURGERY     rhinoplasty  . ESOPHAGOGASTRODUODENOSCOPY  03/30/2005  . PTOSIS REPAIR Bilateral 07/09/2015   Procedure: PTOSIS REPAIR;  Surgeon: Karle Starch, MD;  Location: [facility];  Service: Ophthalmology;  Laterality:  Bilateral;  . rhinoplasty with septoplasty    . TONSILLECTOMY AND ADENOIDECTOMY    . TUBAL LIGATION    . VAGINAL HYSTERECTOMY  1997   menorrhagia ovaries intact   Family History  Problem Relation Age of Onset  . Cancer Mother     Lung, Bladder  . Stroke Father 3    TIAs  . Heart disease Father 43    AMI x 2.  . Colon polyps Father   . Thyroid disease Sister   . Cancer Brother     lung  . Breast cancer Maternal Aunt 60   Social History  Substance Use Topics  . Smoking status: Former Smoker    Packs/day: 1.00    Years: 17.00    Types: Cigarettes    Quit date: 05/22/1988  . Smokeless tobacco: Never Used     Comment: Quit 22 years ago, 1990  . Alcohol use 3.6 oz/week    6 Standard drinks or equivalent per week     Comment: moderate white wine 5 glasses per week      Review of Systems Per HPI    Objective:   Physical Exam  Constitutional: She is oriented to person, place, and time. She appears well-developed and well-nourished.  Cardiovascular: Normal rate.   Pulmonary/Chest: Effort normal.  Neurological: She is alert and oriented to person, place, and time.  Skin:  Skin is warm and dry.  Right index finger with area of erythema, fluctuance and tenderness along medial nail. Patient agreeable to procedure, no allergy to lidocaine. Area cleaned with alcohol. Lidocaine 2% 3 ml injected and #11 scalpel used to make small incision with resulting small amount serous drainage. Patient reports improvement of discomfort.   Psychiatric: She has a normal mood and affect. Her behavior is normal. Judgment and thought content normal.  Vitals reviewed.     BP 116/62 (BP Location: Left Arm, Patient Position: Sitting, Cuff Size: Normal)   Pulse (!) 59   Temp 97.4 F (36.3 C) (Tympanic)   Wt 162 lb 8 oz (73.7 kg)   SpO2 96%   BMI 29.25 kg/m  Wt Readings from Last 3 Encounters:  04/23/16 162 lb 8 oz (73.7 kg)  01/14/16 156 lb 8 oz (71 kg)  01/06/16 156 lb 12.8 oz (71.1 kg)        Assessment & Plan:  1. Paronychia of right index finger - Provided written and verbal information regarding diagnosis and treatment. - warm compresses 3-4 times a day as needed - RTC precautions reviewed- fever, worsening pain/swelling, streaking - doxycycline (VIBRAMYCIN) 100 MG capsule; Take 1 capsule (100 mg total) by mouth 2 (two) times daily.  Dispense: 14 capsule; Refill: 0   Clarene Reamer, FNP-BC  [facility], [facility]  04/23/2016 8:55 AM

## 2016-05-01 ENCOUNTER — Other Ambulatory Visit: Payer: Self-pay

## 2016-05-01 NOTE — Telephone Encounter (Signed)
Pt request refill B [facility]; Dr Diona Browner has not filled before.last annual 01/14/16.Please advise.

## 2016-05-01 NOTE — Telephone Encounter (Signed)
Need recent b12 level ( last 2016).Marland Kitchen Has she done somewhere else.. If not schedule lab visit for B12 check.

## 2016-05-04 NOTE — Telephone Encounter (Signed)
Spoke with Gina Harper.  She has not had her Vit B12 level checked any where else.  She request an office visit to see Dr. Diona Browner for hand issues.  Appointment scheduled for 05/05/16 at 9:00 am.  Can draw her Vit B12 lab at that appointment.  Ok to refill or do we need to wait on lab results?

## 2016-05-05 ENCOUNTER — Ambulatory Visit (INDEPENDENT_AMBULATORY_CARE_PROVIDER_SITE_OTHER): Payer: 59 | Admitting: Family Medicine

## 2016-05-05 ENCOUNTER — Encounter: Payer: Self-pay | Admitting: Family Medicine

## 2016-05-05 VITALS — BP 130/76 | HR 69 | Temp 98.0°F | Ht 62.5 in | Wt 155.5 lb

## 2016-05-05 DIAGNOSIS — E538 Deficiency of other specified B group vitamins: Secondary | ICD-10-CM | POA: Diagnosis not present

## 2016-05-05 DIAGNOSIS — R2230 Localized swelling, mass and lump, unspecified upper limb: Secondary | ICD-10-CM | POA: Insufficient documentation

## 2016-05-05 DIAGNOSIS — L03011 Cellulitis of right finger: Secondary | ICD-10-CM

## 2016-05-05 DIAGNOSIS — R2231 Localized swelling, mass and lump, right upper limb: Secondary | ICD-10-CM

## 2016-05-05 LAB — VITAMIN B12: VITAMIN B 12: 480 pg/mL (ref 211–911)

## 2016-05-05 NOTE — Progress Notes (Signed)
Pre visit review using our clinic review tool, if applicable. No additional management support is needed unless otherwise documented below in the visit note. 

## 2016-05-05 NOTE — Assessment & Plan Note (Signed)
Resolving. No clear suggestion of yeast.. More typical of bacterial infection.

## 2016-05-05 NOTE — Progress Notes (Signed)
   Subjective:    Patient ID: Gina Harper, female    DOB: 1951/04/06, 66 y.o.   MRN: [id number]  HPI   66 year old female presents for follow up of paronychia on right index finger.  She was seen by D. Gessner on 04/23/2016. I and D attempted but not successful.  Treated with doxycycline, warm compresses.  She applied pressure and had some discharge, white.. Now better. Less red, less swollen but  Now on medial side of same nail sore.  She is concerned about yeast/fungal infection.. Sister had similar.  Nodule noted in right 4th digit. Sore as well. Pain with bending finger.   No fever, no joint , no joint swelling.   She is also due for B12 check. 08/2014. Last B12 injection 03/20/2017. 2 weeks overdue for shot. Review of Systems  Constitutional: Negative for fatigue and fever.  HENT: Negative for ear pain.   Eyes: Negative for pain.  Respiratory: Negative for chest tightness and shortness of breath.   Cardiovascular: Negative for chest pain, palpitations and leg swelling.  Gastrointestinal: Negative for abdominal pain.  Genitourinary: Negative for dysuria.       Objective:   Physical Exam  Constitutional: Vital signs are normal. She appears well-developed and well-nourished. She is cooperative.  Non-toxic appearance. She does not appear ill. No distress.  HENT:  Head: Normocephalic.  Right Ear: Hearing, tympanic membrane, external ear and ear canal normal. Tympanic membrane is not erythematous, not retracted and not bulging.  Left Ear: Hearing, tympanic membrane, external ear and ear canal normal. Tympanic membrane is not erythematous, not retracted and not bulging.  Nose: No mucosal edema or rhinorrhea. Right sinus exhibits no maxillary sinus tenderness and no frontal sinus tenderness. Left sinus exhibits no maxillary sinus tenderness and no frontal sinus tenderness.  Mouth/Throat: Uvula is midline, oropharynx is clear and moist and mucous membranes are normal.  Eyes:  Conjunctivae, EOM and lids are normal. Pupils are equal, round, and reactive to light. Lids are everted and swept, no foreign bodies found.  Neck: Trachea normal and normal range of motion. Neck supple. Carotid bruit is not present. No thyroid mass and no thyromegaly present.  Cardiovascular: Normal rate, regular rhythm, S1 normal, S2 normal, normal heart sounds, intact distal pulses and normal pulses.  Exam reveals no gallop and no friction rub.   No murmur heard. Pulmonary/Chest: Effort normal and breath sounds normal. No tachypnea. No respiratory distress. She has no decreased breath sounds. She has no wheezes. She has no rhonchi. She has no rales.  Abdominal: Soft. Normal appearance and bowel sounds are normal. There is no tenderness.  Neurological: She is alert.  Skin: Skin is warm, dry and intact. No rash noted.  0.3 cm nodule right DIP joint 4 th finger   Healed parponychia index finger, no nail changes.  Psychiatric: Her speech is normal and behavior is normal. Judgment and thought content normal. Her mood appears not anxious. Cognition and memory are normal. She does not exhibit a depressed mood.          Assessment & Plan:

## 2016-05-05 NOTE — Patient Instructions (Addendum)
Stop at lab on way out.  Bacterial infeciton in index finger improving.. Continue warm soaks.  Nodule on 4th finger: likely from arthritis. Trial of ibuprofen 800 mg every 8 hours x 1-2 days. Call for referral to hand specialist if not improving or becoming more painful.

## 2016-05-05 NOTE — Assessment & Plan Note (Signed)
Likely OA .Marland Kitchen Treat with NSAID prn x 1-2 days.  Less likely cyst.

## 2016-05-05 NOTE — Assessment & Plan Note (Signed)
Due for re-eval. 

## 2016-05-05 NOTE — Telephone Encounter (Signed)
Pt seen this morning. Will await lab result.

## 2016-05-15 ENCOUNTER — Other Ambulatory Visit: Payer: Self-pay | Admitting: *Deleted

## 2016-05-15 MED ORDER — FLUOXETINE HCL 20 MG PO CAPS: 20.0000 mg | ORAL_CAPSULE | Freq: Every day | ORAL | 1 refills | Status: DC

## 2016-05-28 ENCOUNTER — Ambulatory Visit: Payer: Self-pay | Admitting: Family Medicine

## 2016-08-06 ENCOUNTER — Ambulatory Visit (INDEPENDENT_AMBULATORY_CARE_PROVIDER_SITE_OTHER): Payer: 59 | Admitting: Family Medicine

## 2016-08-06 ENCOUNTER — Encounter: Payer: Self-pay | Admitting: Family Medicine

## 2016-08-06 DIAGNOSIS — F324 Major depressive disorder, single episode, in partial remission: Secondary | ICD-10-CM

## 2016-08-06 DIAGNOSIS — F5104 Psychophysiologic insomnia: Secondary | ICD-10-CM

## 2016-08-06 MED ORDER — ESZOPICLONE 1 MG PO TABS: 1.0000 mg | ORAL_TABLET | Freq: Every evening | ORAL | 0 refills | Status: DC | PRN

## 2016-08-06 NOTE — Progress Notes (Signed)
Pre visit review using our clinic review tool, if applicable. No additional management support is needed unless otherwise documented below in the visit note. 

## 2016-08-06 NOTE — Patient Instructions (Signed)
Work on [employer].  Continue prozac at 20 mg daily.  Trial of Lunesta 1 mg at bedtime.

## 2016-08-06 NOTE — Assessment & Plan Note (Signed)
No clear secondary cause, depression appears moderately controlled on prozac 20 mg daily ( PHQ9 6). Pt with healthy sleep hygiene.. More info and sleep protocol provided.  Pt snores but no clear apnea.. Consider sleep study in future.  Will try trial of lunesta given pt responded well recently. If not covered by insurance will consider trial of increase in prozac or trial of trazodone.

## 2016-08-06 NOTE — Progress Notes (Signed)
   Subjective:    Patient ID: Gina Harper, female    DOB: 19-Mar-1951, 66 y.o.   MRN: [id number]  HPI    66 year old female with history of major depressive do on prozac 20 mg daily presents for difficulty sleeping  In last several years.  Since she retired. Occ naps, not regularly.  Does look at phone in bed.] before going to sleep.   She reports she goes to sleep well, but wakes up 2-3 times at night 1 hour at a time. Wakes up feeling tired.   No nocturia,  She snores at night, but better with weight loss. Husband has not noted apnea. Occ morning headaches.  She tried her friends Lunesta 1/2 tab and was able to sleep well.   Review of Systems  Constitutional: Negative for fatigue and fever.  HENT: Negative for ear pain.   Eyes: Negative for pain.  Respiratory: Negative for chest tightness and shortness of breath.   Cardiovascular: Negative for chest pain, palpitations and leg swelling.  Gastrointestinal: Negative for abdominal pain.  Genitourinary: Negative for dysuria.       Objective:   Physical Exam  Constitutional: Vital signs are normal. She appears well-developed and well-nourished. She is cooperative.  Non-toxic appearance. She does not appear ill. No distress.  HENT:  Head: Normocephalic.  Right Ear: Hearing, tympanic membrane, external ear and ear canal normal. Tympanic membrane is not erythematous, not retracted and not bulging.  Left Ear: Hearing, tympanic membrane, external ear and ear canal normal. Tympanic membrane is not erythematous, not retracted and not bulging.  Nose: No mucosal edema or rhinorrhea. Right sinus exhibits no maxillary sinus tenderness and no frontal sinus tenderness. Left sinus exhibits no maxillary sinus tenderness and no frontal sinus tenderness.  Mouth/Throat: Uvula is midline, oropharynx is clear and moist and mucous membranes are normal.  Eyes: Conjunctivae, EOM and lids are normal. Pupils are equal, round, and reactive to light.  Lids are everted and swept, no foreign bodies found.  Neck: Trachea normal and normal range of motion. Neck supple. Carotid bruit is not present. No thyroid mass and no thyromegaly present.  Cardiovascular: Normal rate, regular rhythm, S1 normal, S2 normal, normal heart sounds, intact distal pulses and normal pulses.  Exam reveals no gallop and no friction rub.   No murmur heard. Pulmonary/Chest: Effort normal and breath sounds normal. No tachypnea. No respiratory distress. She has no decreased breath sounds. She has no wheezes. She has no rhonchi. She has no rales.  Abdominal: Soft. Normal appearance and bowel sounds are normal. There is no tenderness.  Neurological: She is alert.  Skin: Skin is warm, dry and intact. No rash noted.  Psychiatric: Her speech is normal and behavior is normal. Judgment and thought content normal. Her mood appears not anxious. Cognition and memory are normal. She does not exhibit a depressed mood.          Assessment & Plan:

## 2016-08-06 NOTE — Assessment & Plan Note (Addendum)
Fairly well controlled on prozac.

## 2016-08-11 ENCOUNTER — Other Ambulatory Visit: Payer: Self-pay | Admitting: *Deleted

## 2016-08-11 MED ORDER — FLUOXETINE HCL 20 MG PO CAPS: 20.0000 mg | ORAL_CAPSULE | Freq: Every day | ORAL | 1 refills | Status: DC

## 2016-09-03 ENCOUNTER — Other Ambulatory Visit: Payer: Self-pay | Admitting: *Deleted

## 2016-09-03 MED ORDER — ESZOPICLONE 3 MG PO TABS: ORAL_TABLET | ORAL | 0 refills | Status: DC

## 2016-09-03 NOTE — Telephone Encounter (Signed)
Pt left vm at triage requesting medication refill. Pt was prescribed 1mg  but increased to 1.5mg  on her own, which she states works better. Pt is also requesting a new Rx for 3mg  so that she is "able to save a little money." pt requesting a call back to confirm if Rx can be increased and what dosing will be approved.

## 2016-09-03 NOTE — Telephone Encounter (Signed)
Ms. Blincoe notified that Dr. Diona Browner did increase her Lunesta to 3 mg to take 1/2 tablet at bedtime as needed.  Rx faxed to [facility] at [phone] as requested.

## 2016-10-19 ENCOUNTER — Other Ambulatory Visit: Payer: Self-pay | Admitting: *Deleted

## 2016-10-19 MED ORDER — HYDROCHLOROTHIAZIDE 25 MG PO TABS: 25.0000 mg | ORAL_TABLET | Freq: Every day | ORAL | 0 refills | Status: DC

## 2016-10-27 ENCOUNTER — Other Ambulatory Visit: Payer: Self-pay | Admitting: Family Medicine

## 2016-11-09 ENCOUNTER — Other Ambulatory Visit: Payer: Self-pay | Admitting: Family Medicine

## 2016-11-09 DIAGNOSIS — Z1231 Encounter for screening mammogram for malignant neoplasm of breast: Secondary | ICD-10-CM

## 2016-12-07 ENCOUNTER — Ambulatory Visit
Admission: RE | Admit: 2016-12-07 | Discharge: 2016-12-07 | Disposition: A | Discharge status: Home / Self Care | Payer: 59 | Source: Ambulatory Visit | Attending: Family Medicine | Admitting: Family Medicine

## 2016-12-07 DIAGNOSIS — Z1231 Encounter for screening mammogram for malignant neoplasm of breast: Secondary | ICD-10-CM | POA: Diagnosis not present

## 2016-12-12 ENCOUNTER — Other Ambulatory Visit: Payer: Self-pay | Admitting: Family Medicine

## 2017-01-14 ENCOUNTER — Other Ambulatory Visit (INDEPENDENT_AMBULATORY_CARE_PROVIDER_SITE_OTHER): Payer: 59

## 2017-01-14 ENCOUNTER — Telehealth: Payer: Self-pay | Admitting: Family Medicine

## 2017-01-14 DIAGNOSIS — E538 Deficiency of other specified B group vitamins: Secondary | ICD-10-CM

## 2017-01-14 DIAGNOSIS — R7303 Prediabetes: Secondary | ICD-10-CM

## 2017-01-14 DIAGNOSIS — M8580 Other specified disorders of bone density and structure, unspecified site: Secondary | ICD-10-CM

## 2017-01-14 DIAGNOSIS — E7800 Pure hypercholesterolemia, unspecified: Secondary | ICD-10-CM | POA: Diagnosis not present

## 2017-01-14 LAB — COMPREHENSIVE METABOLIC PANEL
ALBUMIN: 4.6 g/dL (ref 3.5–5.2)
ALK PHOS: 60 U/L (ref 39–117)
ALT: 16 U/L (ref 0–35)
AST: 21 U/L (ref 0–37)
BILIRUBIN TOTAL: 0.7 mg/dL (ref 0.2–1.2)
BUN: 15 mg/dL (ref 6–23)
CO2: 29 mEq/L (ref 19–32)
Calcium: 9.5 mg/dL (ref 8.4–10.5)
Chloride: 104 mEq/L (ref 96–112)
Creatinine, Ser: 0.66 mg/dL (ref 0.40–1.20)
GFR: 94.94 mL/min (ref >60.00)
GLUCOSE: 95 mg/dL (ref 70–99)
Potassium: 3.6 mEq/L (ref 3.5–5.1)
Sodium: 139 mEq/L (ref 135–145)
TOTAL PROTEIN: 7.3 g/dL (ref 6.0–8.3)

## 2017-01-14 LAB — LIPID PANEL
CHOLESTEROL: 175 mg/dL (ref 0–200)
HDL: 54.1 mg/dL (ref >39.00)
LDL Cholesterol: 97 mg/dL (ref 0–99)
NONHDL: 120.42
TRIGLYCERIDES: 117 mg/dL (ref 0.0–149.0)
Total CHOL/HDL Ratio: 3
VLDL: 23.4 mg/dL (ref 0.0–40.0)

## 2017-01-14 LAB — VITAMIN B12: VITAMIN B 12: 620 pg/mL (ref 211–911)

## 2017-01-14 LAB — VITAMIN D 25 HYDROXY (VIT D DEFICIENCY, FRACTURES): VITD: 41.77 ng/mL (ref 30.00–100.00)

## 2017-01-14 LAB — HEMOGLOBIN A1C: HEMOGLOBIN A1C: 5.6 % (ref 4.6–6.5)

## 2017-01-14 NOTE — Telephone Encounter (Signed)
-----   Message from Eustace Pen, LPN sent at 5/40/0867  9:49 PM EDT ----- Regarding: Labs 9/27 Lab orders needed.  [facility] Medicare

## 2017-01-15 ENCOUNTER — Other Ambulatory Visit: Payer: Self-pay | Admitting: *Deleted

## 2017-01-15 MED ORDER — HYDROCHLOROTHIAZIDE 25 MG PO TABS: 25.0000 mg | ORAL_TABLET | Freq: Every day | ORAL | 0 refills | Status: DC

## 2017-01-15 MED ORDER — FLUOXETINE HCL 20 MG PO CAPS: 20.0000 mg | ORAL_CAPSULE | Freq: Every day | ORAL | 0 refills | Status: DC

## 2017-01-26 ENCOUNTER — Encounter: Payer: Self-pay | Admitting: Family Medicine

## 2017-01-26 ENCOUNTER — Ambulatory Visit (INDEPENDENT_AMBULATORY_CARE_PROVIDER_SITE_OTHER): Payer: 59 | Admitting: Family Medicine

## 2017-01-26 VITALS — BP 130/80 | HR 61 | Temp 98.3°F | Ht 62.0 in | Wt 161.5 lb

## 2017-01-26 DIAGNOSIS — I10 Essential (primary) hypertension: Secondary | ICD-10-CM

## 2017-01-26 DIAGNOSIS — Z23 Encounter for immunization: Secondary | ICD-10-CM | POA: Diagnosis not present

## 2017-01-26 DIAGNOSIS — F324 Major depressive disorder, single episode, in partial remission: Secondary | ICD-10-CM

## 2017-01-26 DIAGNOSIS — R7303 Prediabetes: Secondary | ICD-10-CM | POA: Diagnosis not present

## 2017-01-26 DIAGNOSIS — E7800 Pure hypercholesterolemia, unspecified: Secondary | ICD-10-CM | POA: Diagnosis not present

## 2017-01-26 DIAGNOSIS — Z0000 Encounter for general adult medical examination without abnormal findings: Secondary | ICD-10-CM | POA: Diagnosis not present

## 2017-01-26 NOTE — Patient Instructions (Signed)
Keep  up great work with healthy eating and regular exercise.   

## 2017-01-26 NOTE — Progress Notes (Signed)
Subjective:    Patient ID: Gina Harper, female    DOB: 11-09-1950, 66 y.o.   MRN: [id number]  HPI  The patient is here for annual wellness exam and preventative care.    Major depression, moderate:  Good control on prozac 20 mg daily. No insomnia and no SI.  Elevated Cholesterol:  At goal on  No medication. Lab Results  Component Value Date   CHOL 175 01/14/2017   HDL 54.10 01/14/2017   LDLCALC 97 01/14/2017   TRIG 117.0 01/14/2017   CHOLHDL 3 01/14/2017  Using medications without problems: Muscle aches:  Diet compliance: moderate Exercise: 3 days a week. Other complaints: Body mass index is 29.54 kg/m.   Prediabetes:  Improved.  Lab Results  Component Value Date   HGBA1C 5.6 01/14/2017   Hypertension:  Well controlled on HCTZ alone. Using medication without problems or lightheadedness:  one Chest pain with exertion: none Edema:none Short of breath:none Average home BPs: Other issues:  Idiopathic peripheral neuropathy: seen by Neuro in past. No clear cause per lab work. Continued despite B12 supplement.  She does have burning in feet off and on. Tolerable at this time on no med.  Falls x 2, no clear cause except accidental.  Social History /Family History/Past Medical History reviewed in detail and updated in EMR if needed. Blood pressure 130/80, pulse 61, temperature 98.3 F (36.8 C), temperature source Oral, height 5\' 2"  (1.575 m), weight 161 lb 8 oz (73.3 kg).   Review of Systems  Constitutional: Negative for fatigue and fever.  HENT: Negative for ear pain.   Eyes: Negative for pain.  Respiratory: Negative for chest tightness and shortness of breath.   Cardiovascular: Negative for chest pain, palpitations and leg swelling.  Gastrointestinal: Negative for abdominal pain.  Genitourinary: Negative for dysuria.       Objective:   Physical Exam  Constitutional: Vital signs are normal. She appears well-developed and well-nourished. She is cooperative.   Non-toxic appearance. She does not appear ill. No distress.  HENT:  Head: Normocephalic.  Right Ear: Hearing, tympanic membrane, external ear and ear canal normal.  Left Ear: Hearing, tympanic membrane, external ear and ear canal normal.  Nose: Nose normal.  Eyes: Pupils are equal, round, and reactive to light. Conjunctivae, EOM and lids are normal. Lids are everted and swept, no foreign bodies found.  Neck: Trachea normal and normal range of motion. Neck supple. Carotid bruit is not present. No thyroid mass and no thyromegaly present.  Cardiovascular: Normal rate, regular rhythm, S1 normal, S2 normal, normal heart sounds and intact distal pulses.  Exam reveals no gallop.   No murmur heard. Pulmonary/Chest: Effort normal and breath sounds normal. No respiratory distress. She has no wheezes. She has no rhonchi. She has no rales.  Abdominal: Soft. Normal appearance and bowel sounds are normal. She exhibits no distension, no fluid wave, no abdominal bruit and no mass. There is no hepatosplenomegaly. There is no tenderness. There is no rebound, no guarding and no CVA tenderness. No hernia.  Lymphadenopathy:    She has no cervical adenopathy.    She has no axillary adenopathy.  Neurological: She is alert. She has normal strength. No cranial nerve deficit or sensory deficit.  Skin: Skin is warm, dry and intact. No rash noted.  Psychiatric: Her speech is normal and behavior is normal. Judgment normal. Her mood appears not anxious. Cognition and memory are normal. She does not exhibit a depressed mood.  Assessment & Plan:  The patient's preventative maintenance and recommended screening tests for an annual wellness exam were reviewed in full today. Brought up to date unless services declined.  Counselled on the importance of diet, exercise, and its role in overall health and mortality. The patient's FH and SH was reviewed, including their home life, tobacco status, and drug and alcohol  status.   PAP not indicated given partial hysterectomy, . Mammogram 11/2016 nml  Colonoscopy 05/20/2015 polyp repeat in 10 years  Flu vaccine given today, PCV23 given.  Hep c neg  DEXA: 2015 T -2.1 spine. 2017 T-2.3

## 2017-06-01 ENCOUNTER — Other Ambulatory Visit: Payer: Self-pay | Admitting: Family Medicine

## 2017-07-16 ENCOUNTER — Other Ambulatory Visit: Payer: Self-pay | Admitting: Family Medicine

## 2017-07-19 ENCOUNTER — Ambulatory Visit: Payer: 59 | Admitting: Primary Care

## 2017-07-19 VITALS — BP 122/78 | HR 69 | Temp 98.3°F | Ht 62.0 in | Wt 163.5 lb

## 2017-07-19 DIAGNOSIS — J069 Acute upper respiratory infection, unspecified: Secondary | ICD-10-CM

## 2017-07-19 MED ORDER — GUAIFENESIN-CODEINE 100-10 MG/5ML PO SYRP: 5.0000 mL | ORAL_SOLUTION | Freq: Three times a day (TID) | ORAL | 0 refills | Status: DC | PRN

## 2017-07-19 NOTE — Patient Instructions (Signed)
You can take the Cheratussin cough suppressant every 8 hours for cough and congestion. Caution as this may cause drowsiness. Hold Mucinex when taking this medication.  Continue Zyrtec and Net Pot Rinses.   Please call me Friday this week if no improvement in your symptoms.  It was a pleasure meeting you!

## 2017-07-19 NOTE — Progress Notes (Signed)
Subjective:    Patient ID: Gina Harper, female    DOB: 06/07/1950, 67 y.o.   MRN: [id number]  HPI  Ms. Levenhagen is a 67 year old female with a history of exercise induced asthma who presents today with a chief complaint of cough.  She also reports sore throat, nasal congestion, chest congestion. Her symptoms began 4 days ago. She denies fevers, sick contacts. She's been taking Mucinex DM Max, Zyrtec, Neti Pot Rinses. She is leaving for [place] in three weeks and would like to feel better soon.  Review of Systems  Constitutional: Negative for chills, fatigue and fever.  HENT: Positive for congestion, sinus pressure and sore throat. Negative for ear pain.   Respiratory: Positive for cough. Negative for shortness of breath and wheezing.   Allergic/Immunologic: Positive for environmental allergies.       Past Medical History:  Diagnosis Date  . 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 Allergic rhinitis, cause unspecified   . Allergy   . Alopecia, unspecified   . Anemia    pernicious anemia  . Arthritis    hands, knees, hips  . Chicken pox   . Depressive disorder, not elsewhere classified   . Disorder of bone and cartilage, unspecified   . Esophageal reflux   . GERD (gastroesophageal reflux disease)   . Hepatitis   . History of shingles   . Insomnia, unspecified   . Measles    red measles  . Motion sickness   . Mumps   . Other abnormal glucose   . Palpitations   . Syncope, vasovagal   . Tobacco use disorder   . Unspecified asthma(493.90)    exercise induced. Never hospitalized  . Unspecified essential hypertension   . Unspecified hearing loss   . Unspecified pruritic disorder      Social History   Socioeconomic History  . Marital status: Married    Spouse name: Not on file  . Number of children: 1  . Years of education: college  . Highest education level: Not on file  Occupational History  . 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  Social Needs  . Financial resource strain: Not on file  . Food insecurity:    Worry: Not on file    Inability: Not on file  . Transportation needs:    Medical: Not on file    Non-medical: Not on file  Tobacco Use  . Smoking status: Former Smoker    Packs/day: 1.00    Years: 17.00    Pack years: 17.00    Types: Cigarettes    Last attempt to quit: 05/22/1988    Years since quitting: 29.1  . Smokeless tobacco: Never Used  . Tobacco comment: Quit 22 years ago, 1990  Substance and Sexual Activity  . Alcohol use: Yes    Alcohol/week: 3.6 oz    Types: 6 Standard drinks or equivalent per week    Comment: moderate white wine 5 glasses per week  . Drug use: No  . Sexual activity: Yes    Partners: Male    Birth control/protection: Post-menopausal, Surgical  Lifestyle  . Physical activity:    Days per week: Not on file    Minutes per session: Not on file  . Stress: Not on file  Relationships  . Social connections:    Talks on phone: Not on file    Gets together: Not on file    Attends religious service:  Not on file    Active member of club or organization: Not on file    Attends meetings of clubs or organizations: Not on file    Relationship status: Not on file  . Intimate partner violence:    Fear of current or ex partner: Not on file    Emotionally abused: Not on file    Physically abused: Not on file    Forced sexual activity: Not on file  Other Topics Concern  . Not on file  Social History Narrative      Marital status:  Married x 40 years, happily married; no domestic abuse.      Children:  1 child; 3 grandchildren local.      Employment:  Retired in 09/2012; [employer] x 21 years; happy.  Pre-admission testing.      Tobacco:  Previous smoker. Quit 1990      Alcohol:  2 glasses of wine three nights per week.      Drugs; none      Exercise:  Treadmill, elliptical, Zumba.  [employer] 3x per week.      Seatbelt:  Always uses seat belts.       +Smoke  alarm and carbon monoxide detector in the home.       Guns:  Guns stored in locked cabinet.       Caffeine use: Coffee, Tea, Carbonated beverages, 3 servings / day.                  Past Surgical History:  Procedure Laterality Date  . bladder tact  1995  . BROW LIFT Bilateral 07/09/2015   Procedure: BLEPHAROPLASTY;  Surgeon: Karle Starch, MD;  Location: [facility];  Service: Ophthalmology;  Laterality: Bilateral;  . BUNIONECTOMY  [id number]   bilateral  . colonoscopy  04/20/2004   no polyps; normal.  Repeat in 10 years.  [place].  Marland Kitchen COLONOSCOPY WITH PROPOFOL N/A 05/20/2015   Procedure: COLONOSCOPY WITH PROPOFOL;  Surgeon: Lollie Sails, MD;  [facility] ENDOSCOPY;  Service: Endoscopy;  Laterality: N/A;  . COSMETIC SURGERY     rhinoplasty  . ESOPHAGOGASTRODUODENOSCOPY  03/30/2005  . PTOSIS REPAIR Bilateral 07/09/2015   Procedure: PTOSIS REPAIR;  Surgeon: Karle Starch, MD;  Location: [facility];  Service: Ophthalmology;  Laterality: Bilateral;  . rhinoplasty with septoplasty    . TONSILLECTOMY AND ADENOIDECTOMY    . TUBAL LIGATION    . VAGINAL HYSTERECTOMY  1997   menorrhagia ovaries intact    Family History  Problem Relation Age of Onset  . Cancer Mother        Lung, Bladder  . Stroke Father 76       TIAs  . Heart disease Father 84       AMI x 2.  . Colon polyps Father   . Thyroid disease Sister   . Cancer Brother        lung  . Breast cancer Maternal Aunt 60    Allergies  Allergen Reactions  . Cefdinir Hives  . Lisinopril Cough and Other (See Comments)  . Meloxicam Swelling  . Omnipen [Ampicillin] Other (See Comments)  . Augmentin [Amoxicillin-Pot Clavulanate] Rash  . Clarithromycin Rash    Current Outpatient Medications on File Prior to Visit  Medication Sig Dispense Refill  . albuterol (PROAIR HFA) 108 (90 BASE) MCG/ACT inhaler Inhale 2 puffs into the lungs every 6 (six) hours as needed. Dispense:  3 inhalers 18 g 1  . Biotin 10000 MCG  TABS Take  1 tablet by mouth daily.    . cetirizine (ZYRTEC) 10 MG tablet Take 10 mg by mouth as needed.     . Cholecalciferol (VITAMIN D3) 2000 UNITS TABS Take by mouth daily.     Marland Kitchen docusate sodium (COLACE) 100 MG capsule Take 100 mg by mouth daily.    Marland Kitchen estradiol (ESTRACE) 0.1 MG/GM vaginal cream Use as diretected (Patient taking differently: Place vaginally as needed. Use as diretected) 42.5 g 11  . eszopiclone 3 MG TABS 1/2 tablet po immediately before bedtime daily prn insomnia 45 tablet 0  . FLUoxetine (PROZAC) 20 MG capsule TAKE 1 CAPSULE BY MOUTH  DAILY 90 capsule 1  . fluticasone (FLONASE) 50 MCG/ACT nasal spray Place into both nostrils daily as needed for allergies or rhinitis.    . hydrochlorothiazide (HYDRODIURIL) 25 MG tablet TAKE 1 TABLET BY MOUTH  DAILY 90 tablet 1  . Multiple Vitamin (MULTI-VITAMINS) TABS Take by mouth daily.    . Potassium Gluconate 550 MG TABS Take by mouth daily.     . promethazine (PHENERGAN) 25 MG suppository Place 1 suppository (25 mg total) rectally every 6 (six) hours as needed. 12 each 1  . promethazine (PHENERGAN) 25 MG tablet Take 1 tablet (25 mg total) by mouth every 6 (six) hours as needed. 90 tablet 1  . vitamin B-12 (CYANOCOBALAMIN) 1000 MCG tablet Take 1,000 mcg by mouth daily.     No current facility-administered medications on file prior to visit.     BP 122/78   Pulse 69   Temp 98.3 F (36.8 C) (Oral)   Ht 5\' 2"  (1.575 m)   Wt 163 lb 8 oz (74.2 kg)   SpO2 99%   BMI 29.90 kg/m    Objective:   Physical Exam  Constitutional: She appears well-nourished. She does not appear ill.  HENT:  Right Ear: Ear canal normal. Tympanic membrane is retracted. Tympanic membrane is not erythematous.  Left Ear: Ear canal normal. Tympanic membrane is bulging. Tympanic membrane is not erythematous.  Nose: Right sinus exhibits no maxillary sinus tenderness and no frontal sinus tenderness. Left sinus exhibits no maxillary sinus tenderness and no frontal  sinus tenderness.  Mouth/Throat: Oropharynx is clear and moist.  Eyes: Conjunctivae are normal.  Neck: Neck supple.  Cardiovascular: Normal rate and regular rhythm.  Pulmonary/Chest: Effort normal and breath sounds normal. She has no wheezes. She has no rales.  Congested cough during exam  Lymphadenopathy:    She has no cervical adenopathy.  Skin: Skin is warm and dry.          Assessment & Plan:  URI:  Cough, congestion, fatigue x 4 days. Exam today with clear lungs, normal vitals. Suspect viral involvement and will treat with conservative measures. Rx for Cheratussin sent to pharmacy, drowsiness precautions provided. Continue Zyrtec, Neti Pot Rinses. She'll call Friday this week if no improvement in symptoms.  Pleas Koch, NP

## 2017-07-23 ENCOUNTER — Ambulatory Visit: Payer: Self-pay | Admitting: *Deleted

## 2017-07-23 ENCOUNTER — Telehealth: Payer: Self-pay | Admitting: Family Medicine

## 2017-07-23 DIAGNOSIS — J069 Acute upper respiratory infection, unspecified: Secondary | ICD-10-CM

## 2017-07-23 MED ORDER — DOXYCYCLINE HYCLATE 100 MG PO TABS: 100.0000 mg | ORAL_TABLET | Freq: Two times a day (BID) | ORAL | 0 refills | Status: DC

## 2017-07-23 NOTE — Telephone Encounter (Unsigned)
Copied from [place] [id number]. Topic: Quick Communication - See Telephone Encounter >> Jul 23, 2017 12:37 PM Hewitt Shorts wrote: CRM for notification. See Telephone encounter for: 07/23/17. Pt states that bedsole wanted her to call and let her know how she is doing and the patient states she  is still having a cough   Best number [phone]

## 2017-07-23 NOTE — Telephone Encounter (Signed)
Pt seen 07/19/17 for cough, congestion; URI. Was instructed to call Friday if no improvement. States has followed all instructions; Zyrtec, Robitussin AC.  Pt now reports "feeling worse."  Facial pain, sore throat, earache "even teeth hurt." 5/10, little relief with tylenol. Productive cough, greenish mucous with occasional bright red flecks, "just a few, not always."  Afebrile. Requesting antibiotics, states "Not a Z-PACK, they don't work for me."   Please advise: [phone]  Reason for Disposition . Earache  Answer Assessment - Initial Assessment Questions 1. LOCATION: "Where does it hurt?"      Facial pain, sore throat, earache "even teeth hurt" 2. ONSET: "When did the sinus pain start?"  (e.g., hours, days)      Seen 07/19/17  Onset 3/28 3. SEVERITY: "How bad is the pain?"   (Scale 1-10; mild, moderate or severe)   - MILD (1-3): doesn't interfere with normal activities    - MODERATE (4-7): interferes with normal activities (e.g., work or school) or awakens from sleep   - SEVERE (8-10): excruciating pain and patient unable to do any normal activities        5/10 4. RECURRENT SYMPTOM: "Have you ever had sinus problems before?" If so, ask: "When was the last time?" and "What happened that time?"     Seen 07/19/17 5. NASAL CONGESTION: "Is the nose blocked?" If so, ask, "Can you open it or must you breathe through the mouth?"     No 6. NASAL DISCHARGE: "Do you have discharge from your nose?" If so ask, "What color?"     Feels like running down back of throat 7. FEVER: "Do you have a fever?" If so, ask: "What is it, how was it measured, and when did it start?"      no 8. OTHER SYMPTOMS: "Do you have any other symptoms?" (e.g., sore throat, cough, earache, difficulty breathing)    Productive cough, green mucous, bright red "flecks" at times, sore throat, earache, teeth hurt.  Protocols used: SINUS PAIN OR CONGESTION-A-AH

## 2017-07-23 NOTE — Telephone Encounter (Signed)
Dr Diona Browner, pt was seen 07/19/17 by Gentry Fitz NP who advised pt to cb on 07/23/17 if no improvement; I got a triage note and sent that to Gentry Fitz NP; if that is not correct please see triage note as well. Thank you.

## 2017-07-23 NOTE — Telephone Encounter (Signed)
Copied from [place] [phone] [phone]. Topic: Quick Communication - See Telephone Encounter >> Jul 23, 2017 12:37 PM Hewitt Shorts wrote: CRM for notification. See Telephone encounter for: 07/23/17. Pt states that bedsole wanted her to call and let her know how she is doing and the patient states she  is still having a cough   Best number [phone]

## 2017-07-23 NOTE — Telephone Encounter (Signed)
Spoken and notified patient of Kate's comments. Patient verbalized understanding. 

## 2017-07-23 NOTE — Telephone Encounter (Signed)
Noted, sounds to be sinusitis. Given allergy to Augmentin and refusal to try Azithromycin will proceed with Doxycycline course. Start Doxycycline antibiotic for the infection. Take 1 tablet by mouth twice daily for 10 days.

## 2017-11-08 ENCOUNTER — Other Ambulatory Visit: Payer: Self-pay | Admitting: Family Medicine

## 2017-11-09 ENCOUNTER — Other Ambulatory Visit: Payer: Self-pay | Admitting: Family Medicine

## 2017-11-09 DIAGNOSIS — Z1231 Encounter for screening mammogram for malignant neoplasm of breast: Secondary | ICD-10-CM

## 2017-12-03 ENCOUNTER — Other Ambulatory Visit: Payer: Self-pay | Admitting: Family Medicine

## 2017-12-08 ENCOUNTER — Ambulatory Visit
Admission: RE | Admit: 2017-12-08 | Discharge: 2017-12-08 | Disposition: A | Discharge status: Home / Self Care | Payer: 59 | Source: Ambulatory Visit | Attending: Family Medicine | Admitting: Family Medicine

## 2017-12-08 DIAGNOSIS — Z1231 Encounter for screening mammogram for malignant neoplasm of breast: Secondary | ICD-10-CM | POA: Diagnosis present

## 2017-12-08 HISTORY — DX: Malignant (primary) neoplasm, unspecified: C80.1

## 2018-01-24 ENCOUNTER — Other Ambulatory Visit: Payer: Self-pay | Admitting: Family Medicine

## 2018-01-28 ENCOUNTER — Ambulatory Visit (INDEPENDENT_AMBULATORY_CARE_PROVIDER_SITE_OTHER)
Admission: RE | Admit: 2018-01-28 | Discharge: 2018-01-28 | Disposition: A | Discharge status: Home / Self Care | Payer: 59 | Source: Ambulatory Visit | Attending: Family Medicine | Admitting: Family Medicine

## 2018-01-28 ENCOUNTER — Encounter: Payer: Self-pay | Admitting: Family Medicine

## 2018-01-28 ENCOUNTER — Ambulatory Visit (INDEPENDENT_AMBULATORY_CARE_PROVIDER_SITE_OTHER): Payer: 59 | Admitting: Family Medicine

## 2018-01-28 VITALS — BP 124/80 | HR 74 | Temp 97.9°F | Ht 62.25 in | Wt 164.5 lb

## 2018-01-28 DIAGNOSIS — R0781 Pleurodynia: Secondary | ICD-10-CM

## 2018-01-28 DIAGNOSIS — G629 Polyneuropathy, unspecified: Secondary | ICD-10-CM

## 2018-01-28 DIAGNOSIS — R7303 Prediabetes: Secondary | ICD-10-CM

## 2018-01-28 DIAGNOSIS — Z0000 Encounter for general adult medical examination without abnormal findings: Secondary | ICD-10-CM

## 2018-01-28 DIAGNOSIS — F324 Major depressive disorder, single episode, in partial remission: Secondary | ICD-10-CM | POA: Diagnosis not present

## 2018-01-28 DIAGNOSIS — E7800 Pure hypercholesterolemia, unspecified: Secondary | ICD-10-CM

## 2018-01-28 DIAGNOSIS — I10 Essential (primary) hypertension: Secondary | ICD-10-CM

## 2018-01-28 DIAGNOSIS — Z23 Encounter for immunization: Secondary | ICD-10-CM | POA: Diagnosis not present

## 2018-01-28 DIAGNOSIS — J45990 Exercise induced bronchospasm: Secondary | ICD-10-CM

## 2018-01-28 DIAGNOSIS — E538 Deficiency of other specified B group vitamins: Secondary | ICD-10-CM

## 2018-01-28 MED ORDER — ESTRADIOL 0.1 MG/GM VA CREA: 1.0000 | TOPICAL_CREAM | Freq: Every day | VAGINAL | 11 refills | Status: DC | PRN

## 2018-01-28 MED ORDER — HYDROCHLOROTHIAZIDE 25 MG PO TABS: 25.0000 mg | ORAL_TABLET | Freq: Every day | ORAL | 3 refills | Status: DC

## 2018-01-28 MED ORDER — ALBUTEROL SULFATE HFA 108 (90 BASE) MCG/ACT IN AERS: 2.0000 | INHALATION_SPRAY | Freq: Four times a day (QID) | RESPIRATORY_TRACT | 1 refills | Status: DC | PRN

## 2018-01-28 MED ORDER — FLUOXETINE HCL 20 MG PO CAPS: 20.0000 mg | ORAL_CAPSULE | Freq: Every day | ORAL | 1 refills | Status: DC

## 2018-01-28 MED ORDER — PROMETHAZINE HCL 25 MG RE SUPP: 25.0000 mg | Freq: Four times a day (QID) | RECTAL | 1 refills | Status: DC | PRN

## 2018-01-28 NOTE — Progress Notes (Signed)
Subjective:    Patient ID: Gina Harper, female    DOB: [birth year], 67 y.o.   MRN: [id number]  HPI  The patient is here for annual wellness exam and preventative care.     MDD, moderate: Good control on prozac 20 mg daily. No insomnia and No SI.  Elevated Cholesterol:  Due for re-eval. Using medications without problems: Muscle aches:  Diet compliance: moderate Exercise: 3 days a week to gym, walks dop Other complaints: Body mass index is 29.85 kg/m.   Wt Readings from Last 3 Encounters:  01/28/18 164 lb 8 oz (74.6 kg)  07/19/17 163 lb 8 oz (74.2 kg)  01/26/17 161 lb 8 oz (73.3 kg)   Prediabetes: due for re-eva;  Hypertension:    Well controlled on HCTZ  BP Readings from Last 3 Encounters:  01/28/18 124/80  07/19/17 122/78  01/26/17 130/80  Using medication without problems or lightheadedness:  none Chest pain with exertion: She had an episode of chest pain at rest..left chest wall, worse with deep breaths. Felt like tightness. Lasted 10-15 minutes.  Brother and  Mother with lung cancer. Edema:none Short of breath: None, during episode it was hard to get a deep breath. Average home BPs: Other issues: When she laughs she notes she always starts coughing.  No unexpected weight loss  She is a former smoker.. Quit 30 years ago, 1.5 pack per day for 17 years, high second hand smoke exposure.    Idiopathic peripheral neuropathy: seen by Neuro in past. No clear cause per lab work. Continued despite B12 supplement.  She does have burning in feet off and on. Tolerable at this time on no med.   Social History /Family History/Past Medical History reviewed in detail and updated in EMR if needed. Blood pressure 124/80, pulse 74, temperature 97.9 F (36.6 C), temperature source Oral, height 5' 2.25" (1.581 m), weight 164 lb 8 oz (74.6 kg).  Review of Systems  Constitutional: Negative for fatigue and fever.  HENT: Negative for congestion.   Eyes: Negative for pain.    Respiratory: Positive for cough and chest tightness. Negative for shortness of breath and wheezing.   Cardiovascular: Positive for chest pain. Negative for palpitations and leg swelling.  Gastrointestinal: Negative for abdominal pain.  Genitourinary: Negative for dysuria and vaginal bleeding.  Musculoskeletal: Negative for back pain.  Neurological: Negative for syncope, light-headedness and headaches.  Psychiatric/Behavioral: Negative for dysphoric mood.       Objective:   Physical Exam  Constitutional: Vital signs are normal. She appears well-developed and well-nourished. She is cooperative.  Non-toxic appearance. She does not appear ill. No distress.  HENT:  Head: Normocephalic.  Right Ear: Hearing, tympanic membrane, external ear and ear canal normal.  Left Ear: Hearing, tympanic membrane, external ear and ear canal normal.  Nose: Nose normal.  Eyes: Pupils are equal, round, and reactive to light. Conjunctivae, EOM and lids are normal. Lids are everted and swept, no foreign bodies found.  Neck: Trachea normal and normal range of motion. Neck supple. Carotid bruit is not present. No thyroid mass and no thyromegaly present.  Cardiovascular: Normal rate, regular rhythm, S1 normal, S2 normal, normal heart sounds and intact distal pulses. Exam reveals no gallop.  No murmur heard. Pulmonary/Chest: Effort normal and breath sounds normal. No respiratory distress. She has no wheezes. She has no rhonchi. She has no rales.  Abdominal: Soft. Normal appearance and bowel sounds are normal. She exhibits no distension, no fluid wave, no abdominal bruit and no  mass. There is no hepatosplenomegaly. There is no tenderness. There is no rebound, no guarding and no CVA tenderness. No hernia.  Lymphadenopathy:    She has no cervical adenopathy.    She has no axillary adenopathy.  Neurological: She is alert. She has normal strength. No cranial nerve deficit or sensory deficit.  Skin: Skin is warm, dry and  intact. No rash noted.  Psychiatric: Her speech is normal and behavior is normal. Judgment normal. Her mood appears not anxious. Cognition and memory are normal. She does not exhibit a depressed mood.   Decreased sensation bilateral feet       Assessment & Plan:  The patient's preventative maintenance and recommended screening tests for an annual wellness exam were reviewed in full today. Brought up to date unless services declined.  Counselled on the importance of diet, exercise, and its role in overall health and mortality. The patient's FH and SH was reviewed, including their home life, tobacco status, and drug and alcohol status.   PAP not indicated given partial hysterectomy, no family history of ovarian cancer. occ lower abd pain,  Either side off and on. Mammogram 11/2017 nml Colonoscopy 05/20/2015 polyp repeat in 10 years Flu vaccine given today, pneumonia uptodate Hep c neg  DEXA: 2017  Osteopenia, repeat due this year.. Will plan in 2020 with mammogram.

## 2018-01-28 NOTE — Patient Instructions (Addendum)
Stop at  front desk on way out to set up lab only appt.  Call to set up DEXA along with mammogram next year in August. We will call with CXR results.Marland Kitchen

## 2018-01-28 NOTE — Assessment & Plan Note (Signed)
Good control on prozac. No SE.

## 2018-01-28 NOTE — Assessment & Plan Note (Signed)
No cardiac symptoms. ? Chest wall related, asthma related vs viral pleurisy.  Eval with CXR to rule out pneumonia or mass given smoking history.

## 2018-01-28 NOTE — Assessment & Plan Note (Signed)
Stable using albuterol prn.

## 2018-01-31 ENCOUNTER — Other Ambulatory Visit (INDEPENDENT_AMBULATORY_CARE_PROVIDER_SITE_OTHER): Payer: 59

## 2018-01-31 DIAGNOSIS — E538 Deficiency of other specified B group vitamins: Secondary | ICD-10-CM | POA: Diagnosis not present

## 2018-01-31 DIAGNOSIS — E7800 Pure hypercholesterolemia, unspecified: Secondary | ICD-10-CM

## 2018-01-31 DIAGNOSIS — R7303 Prediabetes: Secondary | ICD-10-CM

## 2018-01-31 LAB — COMPREHENSIVE METABOLIC PANEL
ALK PHOS: 66 U/L (ref 39–117)
ALT: 22 U/L (ref 0–35)
AST: 21 U/L (ref 0–37)
Albumin: 4.4 g/dL (ref 3.5–5.2)
BILIRUBIN TOTAL: 0.5 mg/dL (ref 0.2–1.2)
BUN: 14 mg/dL (ref 6–23)
CALCIUM: 9.3 mg/dL (ref 8.4–10.5)
CO2: 30 meq/L (ref 19–32)
Chloride: 103 mEq/L (ref 96–112)
Creatinine, Ser: 0.69 mg/dL (ref 0.40–1.20)
GFR: 89.91 mL/min (ref >60.00)
Glucose, Bld: 124 mg/dL — ABNORMAL HIGH (ref 70–99)
POTASSIUM: 3.7 meq/L (ref 3.5–5.1)
Sodium: 140 mEq/L (ref 135–145)
Total Protein: 7.4 g/dL (ref 6.0–8.3)

## 2018-01-31 LAB — LIPID PANEL
CHOLESTEROL: 165 mg/dL (ref 0–200)
HDL: 50.9 mg/dL (ref >39.00)
LDL CALC: 95 mg/dL (ref 0–99)
NonHDL: 114.26
Total CHOL/HDL Ratio: 3
Triglycerides: 98 mg/dL (ref 0.0–149.0)
VLDL: 19.6 mg/dL (ref 0.0–40.0)

## 2018-01-31 LAB — HEMOGLOBIN A1C: HEMOGLOBIN A1C: 5.7 % (ref 4.6–6.5)

## 2018-01-31 LAB — VITAMIN B12: Vitamin B-12: 620 pg/mL (ref 211–911)

## 2018-02-01 ENCOUNTER — Telehealth: Payer: Self-pay | Admitting: Family Medicine

## 2018-02-01 NOTE — Telephone Encounter (Signed)
Pt given results and recommendations per notes of Dr. Diona Browner on 02/01/18. Pt verbalized understanding.Pt states she was fasting at the time labs were drawn.Unable to document in result note due to result note not being routed to [facility].

## 2018-02-02 NOTE — Telephone Encounter (Signed)
Ms. Hollern notified as instructed by telephone.  Handouts:  Stopping Prediabetes in its track and [facility] Low Glycemic Plan mailed to patient.

## 2018-02-02 NOTE — Telephone Encounter (Signed)
Let pt know fasting blood sugar was high.. A1C shows that usually it is fairly well controlled, but it may be trending up given glucose was almost in diabetes range on labs. Make sure eating low carb diet... Mail info on prediabetes diet.

## 2018-02-28 NOTE — Assessment & Plan Note (Signed)
Well controlled. Continue current medication.  

## 2018-02-28 NOTE — Assessment & Plan Note (Signed)
Due for re-eval. 

## 2018-02-28 NOTE — Assessment & Plan Note (Signed)
Tolerable at this time on no med.

## 2018-04-20 IMAGING — MG MM DIGITAL DIAGNOSTIC UNILAT*R* W/ TOMO W/ CAD
6 series · 6 of 14 positions shown · non-contrast
Comparison: Previous exam(s).

CLINICAL DATA: The patient was called back from screening
mammography due to a possible mass in the right breast

EXAM:
2D DIGITAL DIAGNOSTIC UNILATERAL RIGHT MAMMOGRAM WITH CAD AND
ADJUNCT TOMO

[R CC synth-2D]
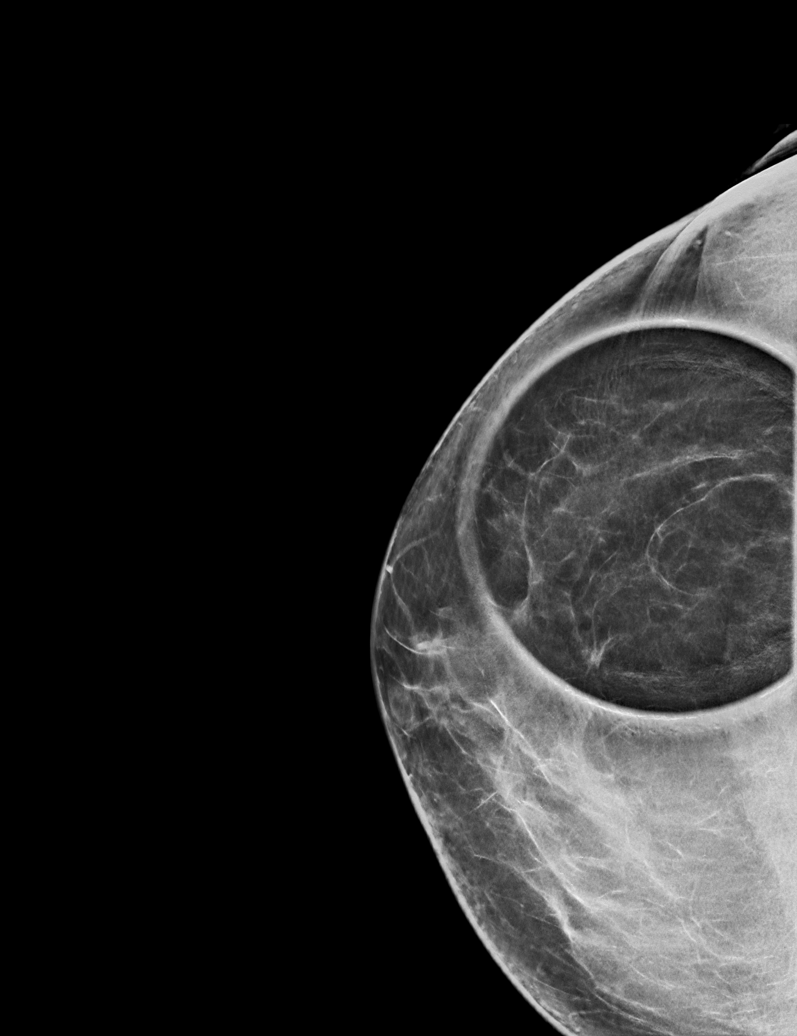

[R MLO]
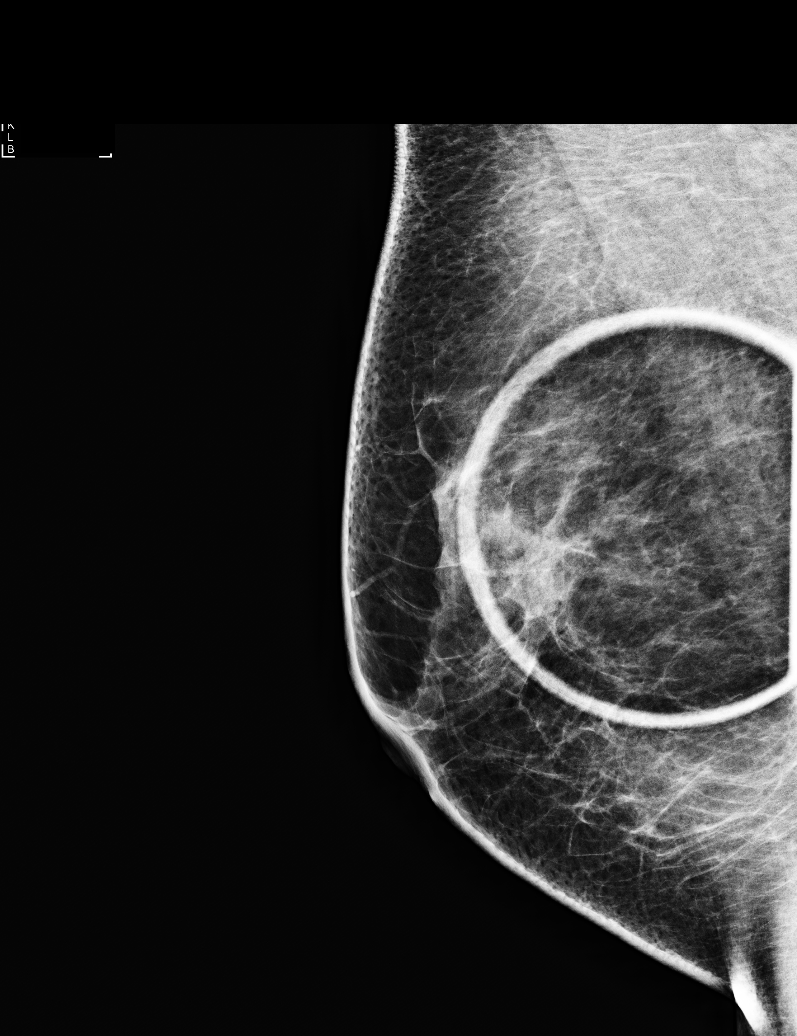

[R MLO synth-2D]
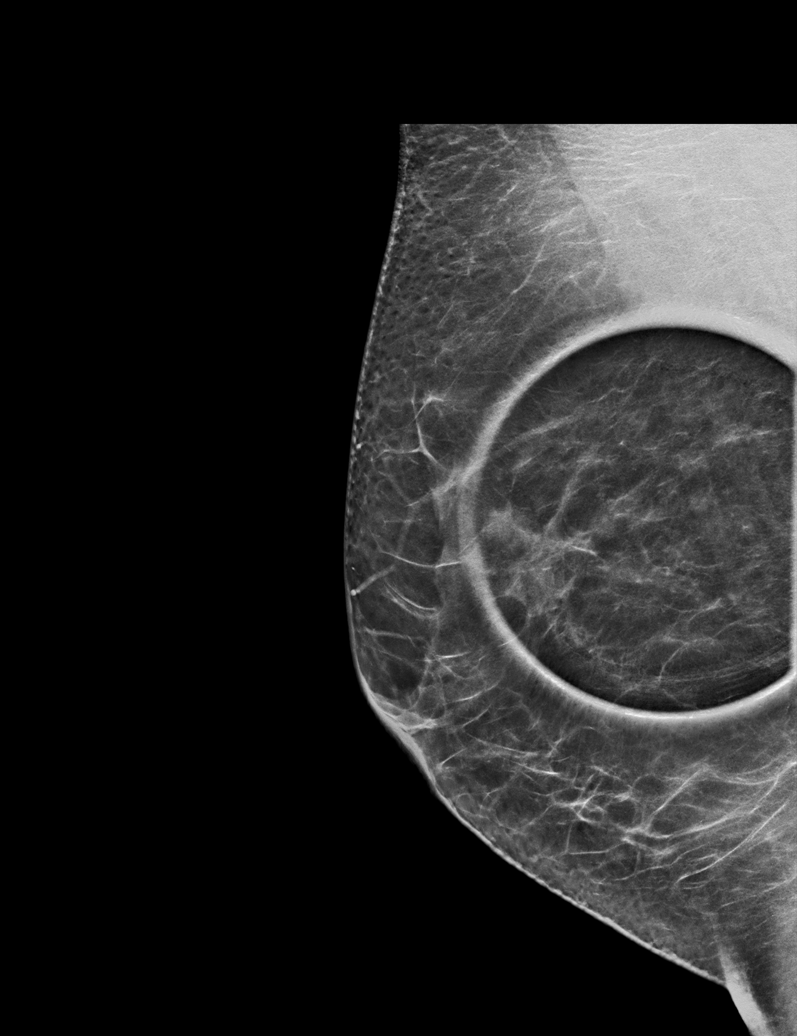

[R CC]
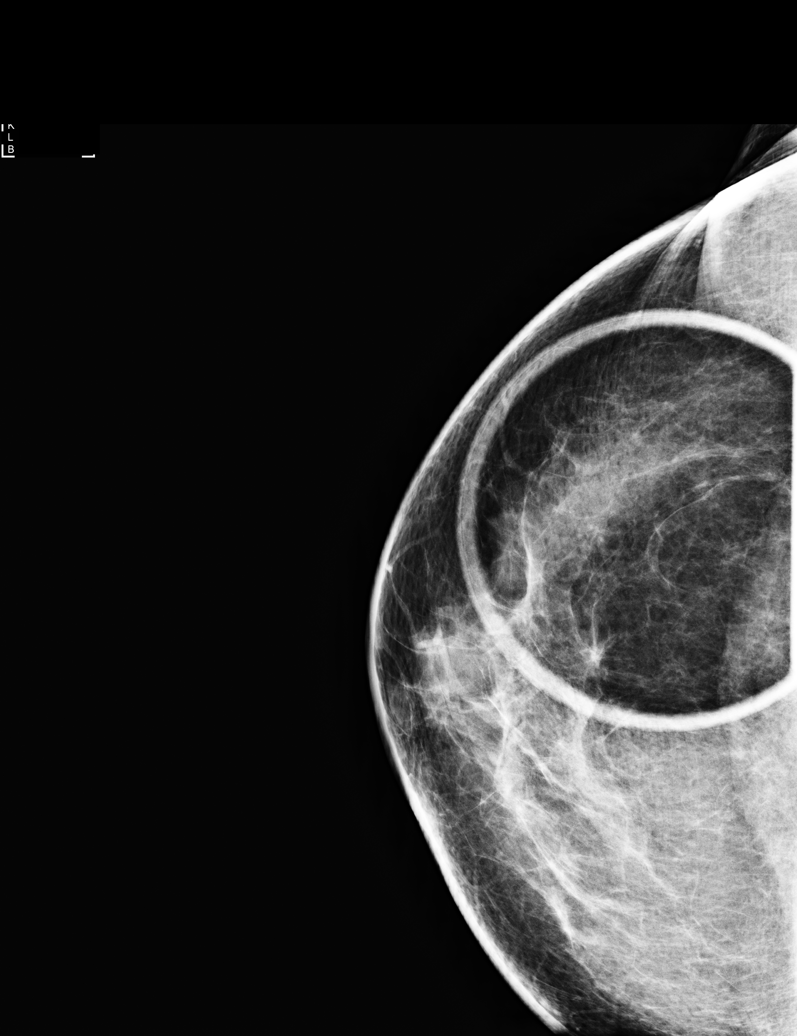

[R CC tomo · tomo slice 33/65.0]
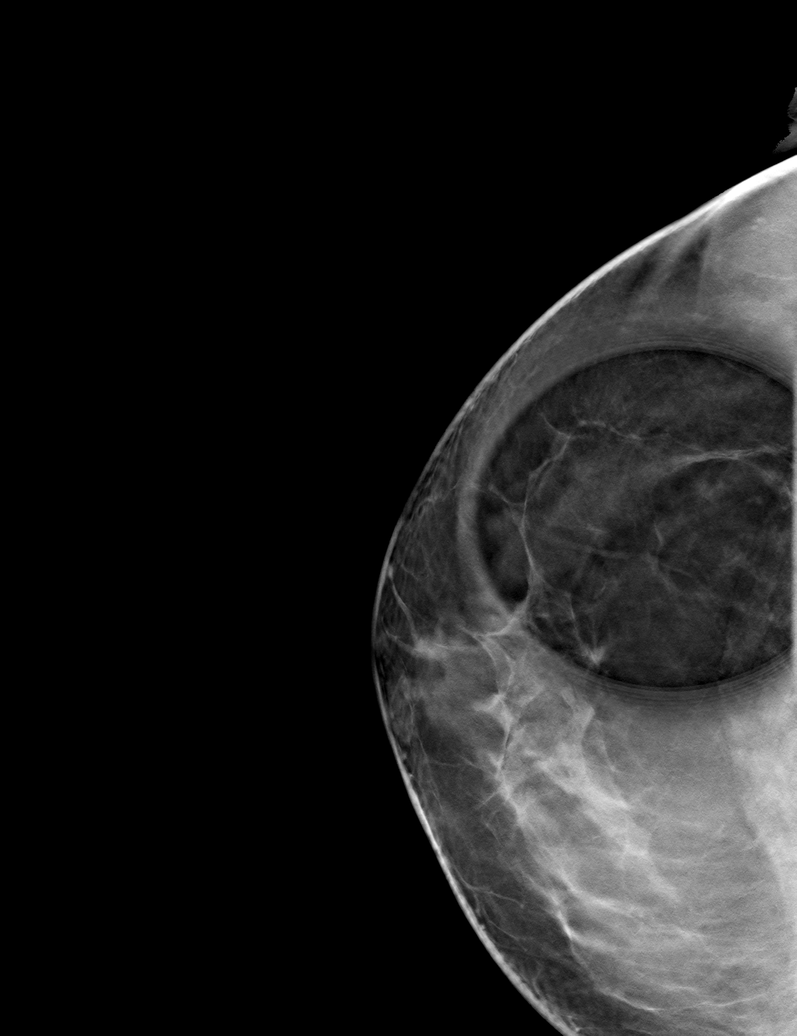

[R MLO tomo · tomo slice 32/63.0]
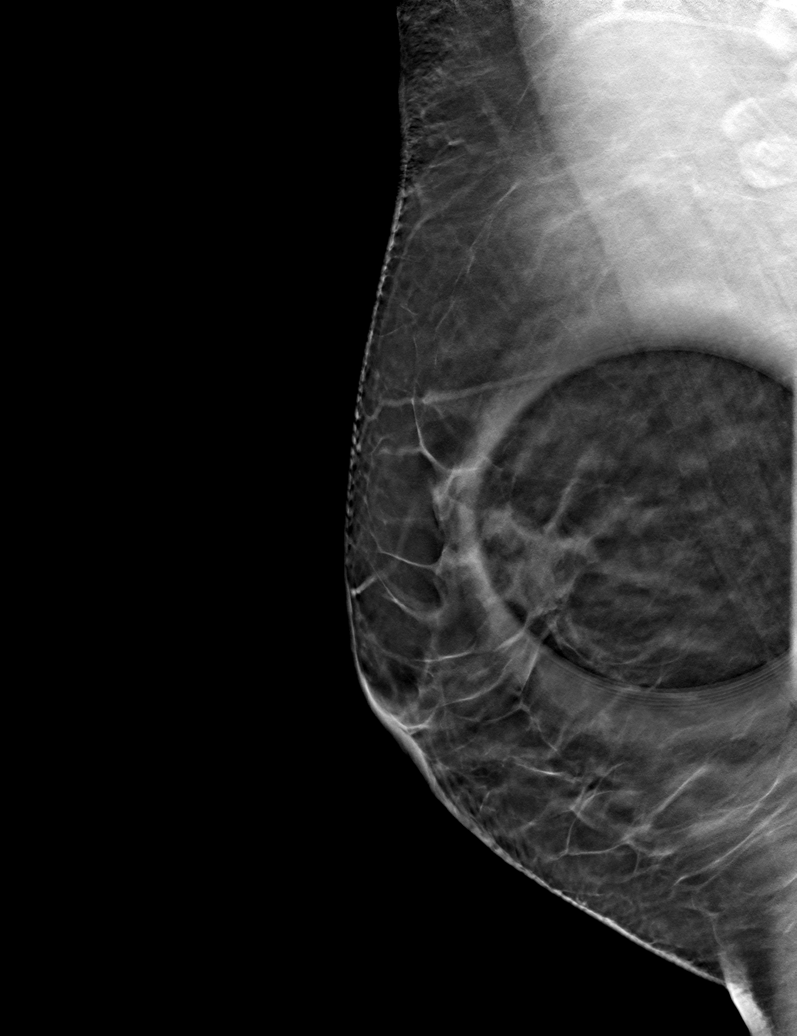

[6 of 14 positions shown; findings below may reference images not displayed]

ACR Breast Density Category b: There are scattered areas of
fibroglandular density.
FINDINGS: The possible mass in the right breast resolves on additional
imaging.

Mammographic images were processed with CAD.
IMPRESSION: No mammographic evidence of malignancy

RECOMMENDATION:
Annual screening mammography

I have discussed the findings and recommendations with the patient.
Results were also provided in writing at the conclusion of the
visit. If applicable, a reminder letter will be sent to the patient
regarding the next appointment.

BI-RADS CATEGORY  2: Benign.

## 2018-07-22 ENCOUNTER — Encounter: Payer: Self-pay | Admitting: Family Medicine

## 2018-07-22 ENCOUNTER — Other Ambulatory Visit: Payer: Self-pay

## 2018-07-22 ENCOUNTER — Ambulatory Visit (INDEPENDENT_AMBULATORY_CARE_PROVIDER_SITE_OTHER): Payer: Medicare Other | Admitting: Family Medicine

## 2018-07-22 DIAGNOSIS — R1031 Right lower quadrant pain: Secondary | ICD-10-CM | POA: Insufficient documentation

## 2018-07-22 NOTE — Assessment & Plan Note (Signed)
   Given desire for social distance with COVID19  And pt not wanting to go directly to radiology facilitiy for imaging... Pt will  Instead come into office for  abd and pelvic exam next week. If exam normal pt wishes to follow achiness over time and only do [place] if not improving.. If exam ab normal.. refer for [place].  Symptoms may be from  IBS D  Given she has chronic intermittant lose stools longterm.

## 2018-07-22 NOTE — Patient Instructions (Signed)
Come to office next week  For abdominal and pelvic exam to eval your pain.

## 2018-07-22 NOTE — Progress Notes (Signed)
VIRTUAL VISIT Due to national recommendations of social distancing due to [place] 19, a virtual visit is felt to be most appropriate for this patient at this time.   I connected with Teriana Danker on 07/22/18 at  4:00 PM EDT by [facility] and verified that I am speaking with the correct person using two identifiers.   I discussed the limitations, risks, security and privacy concerns of performing an evaluation and management service by [facility] and the availability of in person appointments. I also discussed with the patient that there may be a patient responsible charge related to this service. The patient expressed understanding and agreed to proceed.  Patient location: Home Provider Location: [facility] Participants: Eliezer Lofts and [place]   Chief Complaint  Patient presents with  . Heaviness & Aching in Groin Area    Intermitant for a few days-Friend recently Dx with ovarian cancer    History of Present Illness:  68 year old post menopausal female presents with  Right lower adenexal pain.Marland Kitchen intermittently in alst 1-2 weeks. Decribes it as an achy. 3/10 on pain scale  At baseline she has alternating diarrhea and regular BMs.  no cahnge  No blood in stool, no fever  no radiation of pain.  no change with leg movement, no change with eating, no change with bending over and lying down   She has her ovaries, but had hysterectomy in 1997 for fibroids. No vaginal discharge or blood.   no new meds.  Last colonoscopy: tics in sigmoid and decending, 1 polyp 2017  COVID 19 screen No recent travel or known exposure to [place] The patient denies respiratory symptoms of COVID 19 at this time.  The importance of social distancing was discussed today.   Review of Systems  Constitutional: Negative for chills and fever.  HENT: Negative for ear pain.   Eyes: Negative for pain.  Respiratory: Negative for cough and shortness of breath.   Cardiovascular: Negative for chest pain and leg  swelling.  Gastrointestinal: Negative for blood in stool, nausea and vomiting.  Genitourinary: Negative for dysuria and urgency.  Skin: Negative for rash.      Past Medical History:  Diagnosis Date  . 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 Allergic rhinitis, cause unspecified   . Allergy   . Alopecia, unspecified   . Anemia    pernicious anemia  . Arthritis    hands, knees, hips  . Cancer ([place])    melanoma  . Chicken pox   . Depressive disorder, not elsewhere classified   . Disorder of bone and cartilage, unspecified   . Esophageal reflux   . GERD (gastroesophageal reflux disease)   . Hepatitis   . History of shingles   . Insomnia, unspecified   . Measles    red measles  . Motion sickness   . Mumps   . Other abnormal glucose   . Palpitations   . Syncope, vasovagal   . Tobacco use disorder   . Unspecified asthma(493.90)    exercise induced. Never hospitalized  . Unspecified essential hypertension   . Unspecified hearing loss   . Unspecified pruritic disorder     reports that she quit smoking about 30 years ago. Her smoking use included cigarettes. She has a 17.00 pack-year smoking history. She has never used smokeless tobacco. She reports current alcohol use of about 6.0 standard drinks of alcohol per week. She reports that she does not use drugs.  Current Outpatient Medications:  .  albuterol (PROAIR HFA) 108 (90 Base) MCG/ACT inhaler, Inhale 2 puffs into the lungs every 6 (six) hours as needed. Dispense:  3 inhalers, Disp: 18 g, Rfl: 1 .  Biotin 10000 MCG TABS, Take 1 tablet by mouth daily., Disp: , Rfl:  .  cetirizine (ZYRTEC) 10 MG tablet, Take 10 mg by mouth as needed. , Disp: , Rfl:  .  Cholecalciferol (VITAMIN D3) 2000 UNITS TABS, Take by mouth daily. , Disp: , Rfl:  .  docusate sodium (COLACE) 100 MG capsule, Take 100 mg by mouth daily., Disp: , Rfl:  .  estradiol (ESTRACE VAGINAL) 0.1 MG/GM vaginal cream, Place 1  Applicatorful vaginally daily as needed., Disp: 42.5 g, Rfl: 11 .  eszopiclone 3 MG TABS, 1/2 tablet po immediately before bedtime daily prn insomnia, Disp: 45 tablet, Rfl: 0 .  FLUoxetine (PROZAC) 20 MG capsule, Take 1 capsule (20 mg total) by mouth daily., Disp: 90 capsule, Rfl: 1 .  hydrochlorothiazide (HYDRODIURIL) 25 MG tablet, Take 1 tablet (25 mg total) by mouth daily., Disp: 90 tablet, Rfl: 3 .  Multiple Vitamin (MULTI-VITAMINS) TABS, Take by mouth daily., Disp: , Rfl:  .  Potassium Gluconate 550 MG TABS, Take by mouth daily. , Disp: , Rfl:  .  promethazine (PHENERGAN) 25 MG suppository, Place 1 suppository (25 mg total) rectally every 6 (six) hours as needed., Disp: 12 each, Rfl: 1 .  vitamin B-12 (CYANOCOBALAMIN) 1000 MCG tablet, Take 1,000 mcg by mouth daily., Disp: , Rfl:    Observations/Objective: Height 5' 2.25" (1.581 m), weight 160 lb (72.6 kg).  Physical Exam  Physical Exam Constitutional:      General: She is not in acute distress. Pulmonary:     Effort: Pulmonary effort is normal. No respiratory distress.  Neurological:     Mental Status: She is alert and oriented to person, place, and time.  Psychiatric:        Mood and Affect: Mood normal.        Behavior: Behavior normal.  Abdomen:  pt points to right lower quadrant not upper leg as to location of pain  Assessment and Plan  Right lower quadrant pain    Given desire for social distance with COVID19  And pt not wanting to go directly to radiology facilitiy for imaging... Pt will  Instead come into office for  abd and pelvic exam next week. If exam normal pt wishes to follow achiness over time and only do [place] if not improving.. If exam ab normal.. refer for [place].  Symptoms may be from  IBS D  Given she has chronic intermittant lose stools longterm.   I discussed the assessment and treatment plan with the patient. The patient was provided an opportunity to ask questions and all were answered. The patient agreed with  the plan and demonstrated an understanding of the instructions.   The patient was advised to call back or seek an in-person evaluation if the symptoms worsen or if the condition fails to improve as anticipated.     Eliezer Lofts, MD

## 2018-07-26 ENCOUNTER — Ambulatory Visit (INDEPENDENT_AMBULATORY_CARE_PROVIDER_SITE_OTHER): Payer: Medicare Other | Admitting: Family Medicine

## 2018-07-26 ENCOUNTER — Other Ambulatory Visit: Payer: Self-pay

## 2018-07-26 ENCOUNTER — Encounter: Payer: Self-pay | Admitting: Family Medicine

## 2018-07-26 DIAGNOSIS — R1031 Right lower quadrant pain: Secondary | ICD-10-CM | POA: Diagnosis not present

## 2018-07-26 DIAGNOSIS — G629 Polyneuropathy, unspecified: Secondary | ICD-10-CM | POA: Diagnosis not present

## 2018-07-26 LAB — POC URINALSYSI DIPSTICK (AUTOMATED)
Bilirubin, UA: NEGATIVE
Blood, UA: NEGATIVE
Glucose, UA: NEGATIVE
Ketones, UA: NEGATIVE
Nitrite, UA: NEGATIVE
Protein, UA: NEGATIVE
Spec Grav, UA: 1.015 (ref 1.010–1.025)
Urobilinogen, UA: 0.2 E.U./dL
pH, UA: 6 (ref 5.0–8.0)

## 2018-07-26 MED ORDER — ROPINIROLE HCL 0.25 MG PO TABS: 0.2500 mg | ORAL_TABLET | Freq: Every day | ORAL | 0 refills | Status: DC | PRN

## 2018-07-26 NOTE — Addendum Note (Signed)
Addended by: Carter Kitten on: 07/26/2018 10:35 AM   Modules accepted: Orders

## 2018-07-26 NOTE — Assessment & Plan Note (Signed)
Vs restless leg syndrome. Offered trial of gabapentin but she is concerned about SE. She would prefer to try trial of requip low dose to see if RLS.

## 2018-07-26 NOTE — Patient Instructions (Addendum)
Trail of requip for possible restless leg vs peripheral neuropathy.  If not improving call for possible increase in dose.   Call if RLQ pain not resolving or new symptoms develop.  Will plan [place] in 2-3 months if RLQ not improving.

## 2018-07-26 NOTE — Progress Notes (Signed)
Subjective:    Patient ID: Gina Harper, female    DOB: 11-05-1950, 68 y.o.   MRN: [id number]  HPI   68 year old female presents with  persistent RLQ pain.Marland Kitchen ongoing x 2 week. Occ toward suprapubic region. Decribes it as an achy. 3/10 on pain scale  At baseline she has alternating diarrhea and regular BMs.  No cahnge  No blood in stool, no fever  no radiation of pain.  no change with leg movement, no change with eating, no change with bending over and lying down Has had some improvement in last few days.   She has her ovaries, but had hysterectomy in 1997 for fibroids. No vaginal discharge or blood.  no dysuria.  Not sexually active.  no new meds.  Last colonoscopy: tics in sigmoid and decending, 1 polyp 2017  When sitting still later in the day... sharp pains in legs ( tingly sharp pain, legs bilateral itching in lower legs ( some in low back) Legs with occ sharp  Bite like pain momentary) No numbness, no weakness  Feel better with movement.  Does not wake her up at night. Dx with neuropathy peripheral in past.. saw neurologist in the past. Worse since she is sitting and wtching more TV.  Social History /Family History/Past Medical History reviewed in detail and updated in EMR if needed. Blood pressure 140/76, pulse (!) 55, temperature 97.7 F (36.5 C), temperature source Oral, height 5' 2.25" (1.581 m), weight 164 lb 8 oz (74.6 kg).   Review of Systems  Constitutional: Negative for fatigue and fever.  HENT: Negative for congestion.   Eyes: Negative for pain.  Respiratory: Negative for cough and shortness of breath.   Cardiovascular: Negative for chest pain, palpitations and leg swelling.  Gastrointestinal: Positive for abdominal pain and diarrhea.       Has IBS symptoms.. stable for her  Genitourinary: Negative for dysuria and vaginal bleeding.  Musculoskeletal: Negative for back pain.  Neurological: Negative for syncope, light-headedness and headaches.   Psychiatric/Behavioral: Negative for dysphoric mood.       Objective:   Physical Exam Exam conducted with a chaperone present.  Constitutional:      General: She is not in acute distress.    Appearance: Normal appearance. She is well-developed. She is not ill-appearing or toxic-appearing.  HENT:     Head: Normocephalic.     Right Ear: Hearing, tympanic membrane, ear canal and external ear normal. Tympanic membrane is not erythematous, retracted or bulging.     Left Ear: Hearing, tympanic membrane, ear canal and external ear normal. Tympanic membrane is not erythematous, retracted or bulging.     Nose: No mucosal edema or rhinorrhea.     Right Sinus: No maxillary sinus tenderness or frontal sinus tenderness.     Left Sinus: No maxillary sinus tenderness or frontal sinus tenderness.     Mouth/Throat:     Pharynx: Uvula midline.  Eyes:     General: Lids are normal. Lids are everted, no foreign bodies appreciated.     Conjunctiva/sclera: Conjunctivae normal.     Pupils: Pupils are equal, round, and reactive to light.  Neck:     Musculoskeletal: Normal range of motion and neck supple.     Thyroid: No thyroid mass or thyromegaly.     Vascular: No carotid bruit.     Trachea: Trachea normal.  Cardiovascular:     Rate and Rhythm: Normal rate and regular rhythm.     Pulses: Normal pulses.  Heart sounds: Normal heart sounds, S1 normal and S2 normal. No murmur. No friction rub. No gallop.   Pulmonary:     Effort: Pulmonary effort is normal. No tachypnea or respiratory distress.     Breath sounds: Normal breath sounds. No decreased breath sounds, wheezing, rhonchi or rales.  Abdominal:     General: Bowel sounds are normal.     Palpations: Abdomen is soft.     Tenderness: There is abdominal tenderness in the right lower quadrant. There is no guarding or rebound.     Hernia: There is no hernia in the right inguinal area or left inguinal area.  Genitourinary:    Exam position: Supine.      Pubic Area: No rash.      Labia:        Right: No rash.        Left: No rash.      Vagina: Normal.     Uterus: Normal.      Adnexa: Left adnexa normal.       Right: Tenderness present. No mass or fullness.         Left: No mass, tenderness or fullness.       Comments:  tenderness mild in RLQ but only  on firm palpation Musculoskeletal:     Lumbar back: Normal.  Lymphadenopathy:     Lower Body: No right inguinal adenopathy. No left inguinal adenopathy.  Skin:    General: Skin is warm and dry.     Findings: No rash.  Neurological:     Mental Status: She is alert and oriented to person, place, and time.     GCS: GCS eye subscore is 4. GCS verbal subscore is 5. GCS motor subscore is 6.     Cranial Nerves: No cranial nerve deficit.     Sensory: No sensory deficit.     Motor: No abnormal muscle tone.     Coordination: Coordination normal.     Gait: Gait normal.     Deep Tendon Reflexes: Reflexes are normal and symmetric.     Comments: Nml cerebellar exam   No papilledema  Psychiatric:        Mood and Affect: Mood is not anxious or depressed.        Speech: Speech normal.        Behavior: Behavior normal. Behavior is cooperative.        Thought Content: Thought content normal.        Cognition and Memory: Memory is not impaired. She does not exhibit impaired recent memory or impaired remote memory.        Judgment: Judgment normal.           Assessment & Plan:

## 2018-07-26 NOTE — Assessment & Plan Note (Signed)
UA negative.  Nml exam of adenexa excpet mild pain on right.  Most likely related to GI issues chronic.. but if worsening or not resolving in 3 months will refer for [place].

## 2018-08-05 ENCOUNTER — Other Ambulatory Visit: Payer: Self-pay | Admitting: Family Medicine

## 2018-08-19 ENCOUNTER — Other Ambulatory Visit: Payer: Self-pay | Admitting: Family Medicine

## 2018-08-19 NOTE — Telephone Encounter (Signed)
Spoke with Gina Harper.  She states she stopped taking the ropinirole because it made her nauseated.  Medication list updated and refill denied.  She states she will call back to schedule virtual visit if she wants to discuss other options.

## 2018-08-19 NOTE — Addendum Note (Signed)
Addended by: Carter Kitten on: 08/19/2018 10:30 AM   Modules accepted: Orders

## 2018-08-19 NOTE — Telephone Encounter (Signed)
Last office visit 07/26/2018 for RLQ pain & peripheral polyneuropathy.  Last refilled 07/26/2018 for #30 with no refills.  No future appointments.

## 2018-08-19 NOTE — Telephone Encounter (Signed)
Call pt.. offer follow up leg pain virtual visit to discuss how ropinerole working and any adjustments needed.. Not required for refill.  If doing great okay to refill ropinirole at this dose  # 30 5 RF

## 2018-09-24 ENCOUNTER — Other Ambulatory Visit: Payer: Self-pay | Admitting: Family Medicine

## 2018-10-18 DIAGNOSIS — Z85820 Personal history of malignant melanoma of skin: Secondary | ICD-10-CM | POA: Diagnosis not present

## 2018-10-18 DIAGNOSIS — I781 Nevus, non-neoplastic: Secondary | ICD-10-CM | POA: Diagnosis not present

## 2018-10-18 DIAGNOSIS — L853 Xerosis cutis: Secondary | ICD-10-CM | POA: Diagnosis not present

## 2018-10-18 DIAGNOSIS — L570 Actinic keratosis: Secondary | ICD-10-CM | POA: Diagnosis not present

## 2018-10-18 DIAGNOSIS — L578 Other skin changes due to chronic exposure to nonionizing radiation: Secondary | ICD-10-CM | POA: Diagnosis not present

## 2018-10-18 DIAGNOSIS — D1800 Hemangioma unspecified site: Secondary | ICD-10-CM | POA: Diagnosis not present

## 2018-11-03 ENCOUNTER — Other Ambulatory Visit: Payer: Self-pay | Admitting: Family Medicine

## 2018-11-03 DIAGNOSIS — Z1231 Encounter for screening mammogram for malignant neoplasm of breast: Secondary | ICD-10-CM

## 2018-11-15 DIAGNOSIS — M5412 Radiculopathy, cervical region: Secondary | ICD-10-CM | POA: Diagnosis not present

## 2018-11-15 DIAGNOSIS — M25511 Pain in right shoulder: Secondary | ICD-10-CM | POA: Diagnosis not present

## 2018-11-17 DIAGNOSIS — M67813 Other specified disorders of tendon, right shoulder: Secondary | ICD-10-CM | POA: Diagnosis not present

## 2018-11-17 DIAGNOSIS — M50222 Other cervical disc displacement at C5-C6 level: Secondary | ICD-10-CM | POA: Diagnosis not present

## 2018-11-17 DIAGNOSIS — M50221 Other cervical disc displacement at C4-C5 level: Secondary | ICD-10-CM | POA: Diagnosis not present

## 2018-11-17 DIAGNOSIS — M25511 Pain in right shoulder: Secondary | ICD-10-CM | POA: Diagnosis not present

## 2018-11-17 DIAGNOSIS — M50223 Other cervical disc displacement at C6-C7 level: Secondary | ICD-10-CM | POA: Diagnosis not present

## 2018-11-17 DIAGNOSIS — M5412 Radiculopathy, cervical region: Secondary | ICD-10-CM | POA: Diagnosis not present

## 2018-11-17 DIAGNOSIS — M5021 Other cervical disc displacement,  high cervical region: Secondary | ICD-10-CM | POA: Diagnosis not present

## 2018-11-24 DIAGNOSIS — M25511 Pain in right shoulder: Secondary | ICD-10-CM | POA: Diagnosis not present

## 2018-11-24 DIAGNOSIS — M5412 Radiculopathy, cervical region: Secondary | ICD-10-CM | POA: Diagnosis not present

## 2018-11-29 DIAGNOSIS — M542 Cervicalgia: Secondary | ICD-10-CM | POA: Diagnosis not present

## 2018-11-29 DIAGNOSIS — S46011A Strain of muscle(s) and tendon(s) of the rotator cuff of right shoulder, initial encounter: Secondary | ICD-10-CM | POA: Diagnosis not present

## 2018-12-12 ENCOUNTER — Ambulatory Visit
Admission: RE | Admit: 2018-12-12 | Discharge: 2018-12-12 | Disposition: A | Discharge status: Home / Self Care | Payer: Medicare Other | Source: Ambulatory Visit | Attending: Family Medicine | Admitting: Family Medicine

## 2018-12-12 DIAGNOSIS — Z1231 Encounter for screening mammogram for malignant neoplasm of breast: Secondary | ICD-10-CM | POA: Insufficient documentation

## 2018-12-13 DIAGNOSIS — S46011D Strain of muscle(s) and tendon(s) of the rotator cuff of right shoulder, subsequent encounter: Secondary | ICD-10-CM | POA: Diagnosis not present

## 2018-12-13 DIAGNOSIS — M25511 Pain in right shoulder: Secondary | ICD-10-CM | POA: Diagnosis not present

## 2018-12-13 DIAGNOSIS — S46111D Strain of muscle, fascia and tendon of long head of biceps, right arm, subsequent encounter: Secondary | ICD-10-CM | POA: Diagnosis not present

## 2018-12-27 DIAGNOSIS — S46111D Strain of muscle, fascia and tendon of long head of biceps, right arm, subsequent encounter: Secondary | ICD-10-CM | POA: Diagnosis not present

## 2018-12-27 DIAGNOSIS — S46011D Strain of muscle(s) and tendon(s) of the rotator cuff of right shoulder, subsequent encounter: Secondary | ICD-10-CM | POA: Diagnosis not present

## 2018-12-27 DIAGNOSIS — M25511 Pain in right shoulder: Secondary | ICD-10-CM | POA: Diagnosis not present

## 2018-12-29 DIAGNOSIS — M25511 Pain in right shoulder: Secondary | ICD-10-CM | POA: Diagnosis not present

## 2018-12-29 DIAGNOSIS — S46111D Strain of muscle, fascia and tendon of long head of biceps, right arm, subsequent encounter: Secondary | ICD-10-CM | POA: Diagnosis not present

## 2018-12-29 DIAGNOSIS — S46011D Strain of muscle(s) and tendon(s) of the rotator cuff of right shoulder, subsequent encounter: Secondary | ICD-10-CM | POA: Diagnosis not present

## 2019-01-03 DIAGNOSIS — M25511 Pain in right shoulder: Secondary | ICD-10-CM | POA: Diagnosis not present

## 2019-01-16 ENCOUNTER — Other Ambulatory Visit: Payer: Self-pay | Admitting: Family Medicine

## 2019-01-31 ENCOUNTER — Ambulatory Visit (INDEPENDENT_AMBULATORY_CARE_PROVIDER_SITE_OTHER): Payer: Medicare Other | Admitting: Family Medicine

## 2019-01-31 ENCOUNTER — Encounter: Payer: Self-pay | Admitting: Family Medicine

## 2019-01-31 ENCOUNTER — Other Ambulatory Visit: Payer: Self-pay

## 2019-01-31 VITALS — BP 130/72 | HR 66 | Temp 98.6°F | Ht 62.25 in | Wt 168.1 lb

## 2019-01-31 DIAGNOSIS — F324 Major depressive disorder, single episode, in partial remission: Secondary | ICD-10-CM | POA: Diagnosis not present

## 2019-01-31 DIAGNOSIS — Z23 Encounter for immunization: Secondary | ICD-10-CM

## 2019-01-31 DIAGNOSIS — I10 Essential (primary) hypertension: Secondary | ICD-10-CM | POA: Diagnosis not present

## 2019-01-31 DIAGNOSIS — Z01818 Encounter for other preprocedural examination: Secondary | ICD-10-CM | POA: Diagnosis not present

## 2019-01-31 DIAGNOSIS — R7303 Prediabetes: Secondary | ICD-10-CM

## 2019-01-31 LAB — POCT GLYCOSYLATED HEMOGLOBIN (HGB A1C): Hemoglobin A1C: 5.7 % — AB (ref 4.0–5.6)

## 2019-01-31 MED ORDER — HYDROCHLOROTHIAZIDE 25 MG PO TABS: 25.0000 mg | ORAL_TABLET | Freq: Every day | ORAL | 3 refills | Status: DC

## 2019-01-31 MED ORDER — FLUOXETINE HCL 20 MG PO CAPS: 20.0000 mg | ORAL_CAPSULE | Freq: Every day | ORAL | 1 refills | Status: DC

## 2019-01-31 NOTE — Progress Notes (Signed)
Chief Complaint  Patient presents with  . Pre-op Exam    History of Present Illness: HPI   68 year old female pt with history of prediabetes, high cholesterol, HTN presents for preop eval.  She has upcoming arthroscopic right rotator cuff repair. In 1 week.  Hypertension:   Well controlled  on HCTZ.  BP Readings from Last 3 Encounters:  01/31/19 130/72  07/26/18 140/76  01/28/18 124/80  Using medication without problems or lightheadedness: none Chest pain with exertion:none Edema:none Short of breath:none Average home BPs: Other issues: no issue swallowing.  Lab Results  Component Value Date   HGBA1C 5.7 01/31/2018    MDD well controlled on fluoxetine  Less exercise.  Diet: moderate. Wt Readings from Last 3 Encounters:  01/31/19 168 lb 2 oz (76.3 kg)  07/26/18 164 lb 8 oz (74.6 kg)  07/22/18 160 lb (72.6 kg)     Last surgery: 1997 hysterctomy no complications.  COVID 19 screen No recent travel or known exposure to COVID19 The patient denies respiratory symptoms of COVID 19 at this time.  The importance of social distancing was discussed today.   Review of Systems  Constitutional: Negative for chills and fever.  HENT: Negative for congestion and ear pain.   Eyes: Negative for pain and redness.  Respiratory: Negative for cough and shortness of breath.   Cardiovascular: Negative for chest pain, palpitations and leg swelling.  Gastrointestinal: Negative for abdominal pain, blood in stool, constipation, diarrhea, nausea and vomiting.  Genitourinary: Negative for dysuria.  Musculoskeletal: Negative for falls and myalgias.  Skin: Negative for rash.  Neurological: Negative for dizziness.  Psychiatric/Behavioral: Negative for depression. The patient is not nervous/anxious.       Past Medical History:  Diagnosis Date  . 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 Allergic rhinitis, cause unspecified   . Allergy   . Alopecia,  unspecified   . Anemia    pernicious anemia  . Arthritis    hands, knees, hips  . Cancer ([place])    melanoma  . Chicken pox   . Depressive disorder, not elsewhere classified   . Disorder of bone and cartilage, unspecified   . Esophageal reflux   . GERD (gastroesophageal reflux disease)   . Hepatitis   . History of shingles   . Insomnia, unspecified   . Measles    red measles  . Motion sickness   . Mumps   . Other abnormal glucose   . Palpitations   . Syncope, vasovagal   . Tobacco use disorder   . Unspecified asthma(493.90)    exercise induced. Never hospitalized  . Unspecified essential hypertension   . Unspecified hearing loss   . Unspecified pruritic disorder     reports that she quit smoking about 30 years ago. Her smoking use included cigarettes. She has a 17.00 pack-year smoking history. She has never used smokeless tobacco. She reports current alcohol use of about 6.0 standard drinks of alcohol per week. She reports that she does not use drugs.   Current Outpatient Medications:  .  albuterol (PROAIR HFA) 108 (90 Base) MCG/ACT inhaler, Inhale 2 puffs into the lungs every 6 (six) hours as needed. Dispense:  3 inhalers, Disp: 18 g, Rfl: 1 .  Biotin 10000 MCG TABS, Take 1 tablet by mouth daily., Disp: , Rfl:  .  cetirizine (ZYRTEC) 10 MG tablet, Take 10 mg by mouth as needed. , Disp: , Rfl:  .  Cholecalciferol (VITAMIN D3)  2000 UNITS TABS, Take by mouth daily. , Disp: , Rfl:  .  docusate sodium (COLACE) 100 MG capsule, Take 100 mg by mouth daily., Disp: , Rfl:  .  estradiol (ESTRACE VAGINAL) 0.1 MG/GM vaginal cream, Place 1 Applicatorful vaginally daily as needed., Disp: 42.5 g, Rfl: 11 .  eszopiclone 3 MG TABS, 1/2 tablet po immediately before bedtime daily prn insomnia, Disp: 45 tablet, Rfl: 0 .  FLUoxetine (PROZAC) 20 MG capsule, TAKE 1 CAPSULE BY MOUTH  DAILY, Disp: 90 capsule, Rfl: 0 .  hydrochlorothiazide (HYDRODIURIL) 25 MG tablet, Take 1 tablet (25 mg total) by mouth  daily., Disp: 90 tablet, Rfl: 3 .  Multiple Vitamin (MULTI-VITAMINS) TABS, Take by mouth daily., Disp: , Rfl:  .  Potassium Gluconate 550 MG TABS, Take by mouth daily. , Disp: , Rfl:  .  promethazine (PHENERGAN) 25 MG suppository, Place 1 suppository (25 mg total) rectally every 6 (six) hours as needed., Disp: 12 each, Rfl: 1 .  vitamin B-12 (CYANOCOBALAMIN) 1000 MCG tablet, Take 1,000 mcg by mouth daily., Disp: , Rfl:    Observations/Objective: Blood pressure 130/72, pulse 66, temperature 98.6 F (37 C), temperature source Oral, height 5' 2.25" (1.581 m), weight 168 lb 2 oz (76.3 kg), SpO2 97 %.  Physical Exam Constitutional:      General: She is not in acute distress.    Appearance: Normal appearance. She is well-developed. She is not ill-appearing or toxic-appearing.  HENT:     Head: Normocephalic.     Right Ear: Hearing, tympanic membrane, ear canal and external ear normal. Tympanic membrane is not erythematous, retracted or bulging.     Left Ear: Hearing, tympanic membrane, ear canal and external ear normal. Tympanic membrane is not erythematous, retracted or bulging.     Nose: No mucosal edema or rhinorrhea.     Right Sinus: No maxillary sinus tenderness or frontal sinus tenderness.     Left Sinus: No maxillary sinus tenderness or frontal sinus tenderness.     Mouth/Throat:     Pharynx: Uvula midline.  Eyes:     General: Lids are normal. Lids are everted, no foreign bodies appreciated.     Conjunctiva/sclera: Conjunctivae normal.     Pupils: Pupils are equal, round, and reactive to light.  Neck:     Musculoskeletal: Normal range of motion and neck supple.     Thyroid: No thyroid mass or thyromegaly.     Vascular: No carotid bruit.     Trachea: Trachea normal.  Cardiovascular:     Rate and Rhythm: Normal rate and regular rhythm.     Pulses: Normal pulses.     Heart sounds: Normal heart sounds, S1 normal and S2 normal. No murmur. No friction rub. No gallop.   Pulmonary:      Effort: Pulmonary effort is normal. No tachypnea or respiratory distress.     Breath sounds: Normal breath sounds. No decreased breath sounds, wheezing, rhonchi or rales.  Abdominal:     General: Bowel sounds are normal.     Palpations: Abdomen is soft.     Tenderness: There is no abdominal tenderness.  Skin:    General: Skin is warm and dry.     Findings: No rash.  Neurological:     Mental Status: She is alert.  Psychiatric:        Mood and Affect: Mood is not anxious or depressed.        Speech: Speech normal.        Behavior: Behavior normal.  Behavior is cooperative.        Thought Content: Thought content normal.        Judgment: Judgment normal.      Assessment and Plan   Essential hypertension, benign Well controlled. Continue current medication.   Major depressive disorder, single episode Well controlled. Continue current medication.   Pre-op examination Minimally invasive and low risk for complications.     Eliezer Lofts, MD

## 2019-02-08 DIAGNOSIS — S46011A Strain of muscle(s) and tendon(s) of the rotator cuff of right shoulder, initial encounter: Secondary | ICD-10-CM | POA: Diagnosis not present

## 2019-02-08 DIAGNOSIS — G8918 Other acute postprocedural pain: Secondary | ICD-10-CM | POA: Diagnosis not present

## 2019-02-08 DIAGNOSIS — S46211A Strain of muscle, fascia and tendon of other parts of biceps, right arm, initial encounter: Secondary | ICD-10-CM | POA: Diagnosis not present

## 2019-02-08 DIAGNOSIS — M7551 Bursitis of right shoulder: Secondary | ICD-10-CM | POA: Diagnosis not present

## 2019-02-08 DIAGNOSIS — S43491A Other sprain of right shoulder joint, initial encounter: Secondary | ICD-10-CM | POA: Diagnosis not present

## 2019-02-08 DIAGNOSIS — M24111 Other articular cartilage disorders, right shoulder: Secondary | ICD-10-CM | POA: Diagnosis not present

## 2019-02-08 DIAGNOSIS — M7541 Impingement syndrome of right shoulder: Secondary | ICD-10-CM | POA: Diagnosis not present

## 2019-02-16 DIAGNOSIS — M24111 Other articular cartilage disorders, right shoulder: Secondary | ICD-10-CM | POA: Diagnosis not present

## 2019-02-21 DIAGNOSIS — M25511 Pain in right shoulder: Secondary | ICD-10-CM | POA: Diagnosis not present

## 2019-02-21 DIAGNOSIS — M6281 Muscle weakness (generalized): Secondary | ICD-10-CM | POA: Diagnosis not present

## 2019-02-21 DIAGNOSIS — M25611 Stiffness of right shoulder, not elsewhere classified: Secondary | ICD-10-CM | POA: Diagnosis not present

## 2019-02-25 ENCOUNTER — Emergency Department
Admission: EM | Admit: 2019-02-25 | Discharge: 2019-02-25 | Disposition: A | Discharge status: Home / Self Care | Payer: Medicare Other | Attending: Emergency Medicine | Admitting: Emergency Medicine

## 2019-02-25 ENCOUNTER — Encounter: Payer: Self-pay | Admitting: Family Medicine

## 2019-02-25 ENCOUNTER — Other Ambulatory Visit: Payer: Self-pay

## 2019-02-25 ENCOUNTER — Emergency Department: Payer: Medicare Other

## 2019-02-25 ENCOUNTER — Encounter: Payer: Self-pay | Admitting: Radiology

## 2019-02-25 DIAGNOSIS — Z85820 Personal history of malignant melanoma of skin: Secondary | ICD-10-CM | POA: Insufficient documentation

## 2019-02-25 DIAGNOSIS — R0602 Shortness of breath: Secondary | ICD-10-CM | POA: Diagnosis not present

## 2019-02-25 DIAGNOSIS — Z79899 Other long term (current) drug therapy: Secondary | ICD-10-CM | POA: Insufficient documentation

## 2019-02-25 DIAGNOSIS — Z87891 Personal history of nicotine dependence: Secondary | ICD-10-CM | POA: Diagnosis not present

## 2019-02-25 DIAGNOSIS — Z01818 Encounter for other preprocedural examination: Secondary | ICD-10-CM | POA: Insufficient documentation

## 2019-02-25 DIAGNOSIS — R079 Chest pain, unspecified: Secondary | ICD-10-CM | POA: Diagnosis not present

## 2019-02-25 DIAGNOSIS — R0789 Other chest pain: Secondary | ICD-10-CM | POA: Diagnosis not present

## 2019-02-25 LAB — CBC
HCT: 37.6 % (ref 36.0–46.0)
Hemoglobin: 12.9 g/dL (ref 12.0–15.0)
MCH: 31.5 pg (ref 26.0–34.0)
MCHC: 34.3 g/dL (ref 30.0–36.0)
MCV: 91.9 fL (ref 80.0–100.0)
Platelets: 269 10*3/uL (ref 150–400)
RBC: 4.09 MIL/uL (ref 3.87–5.11)
RDW: 12.8 % (ref 11.5–15.5)
WBC: 9.2 10*3/uL (ref 4.0–10.5)
nRBC: 0 % (ref 0.0–0.2)

## 2019-02-25 LAB — TROPONIN I (HIGH SENSITIVITY)
Troponin I (High Sensitivity): 4 ng/L (ref <18)
Troponin I (High Sensitivity): 4 ng/L (ref <18)

## 2019-02-25 LAB — BASIC METABOLIC PANEL
Anion gap: 16 — ABNORMAL HIGH (ref 5–15)
BUN: 15 mg/dL (ref 8–23)
CO2: 23 mmol/L (ref 22–32)
Calcium: 9.9 mg/dL (ref 8.9–10.3)
Chloride: 101 mmol/L (ref 98–111)
Creatinine, Ser: 0.6 mg/dL (ref 0.44–1.00)
GFR calc Af Amer: >60 mL/min (ref >60)
GFR calc non Af Amer: >60 mL/min (ref >60)
Glucose, Bld: 130 mg/dL — ABNORMAL HIGH (ref 70–99)
Potassium: 3.9 mmol/L (ref 3.5–5.1)
Sodium: 140 mmol/L (ref 135–145)

## 2019-02-25 MED ORDER — IOHEXOL 350 MG/ML SOLN
75.0000 mL | Freq: Once | INTRAVENOUS | Status: AC | PRN
  Administered 2019-02-25: 75 mL via INTRAVENOUS

## 2019-02-25 NOTE — Assessment & Plan Note (Signed)
Well controlled. Continue current medication.  

## 2019-02-25 NOTE — ED Notes (Signed)
MD at bedside. 

## 2019-02-25 NOTE — ED Triage Notes (Signed)
Pt is post op 15 days for rotator cuff surgery to the rt shoulder, pt reports chest pain on the left side and now back pain for the past 3 days with some sob, pain is reproducible with movement. Pt reports pain has been constant for the past couple hours, reports feeling like she needs to take a shallow breath

## 2019-02-25 NOTE — ED Notes (Signed)
E-signature pad not available.  Patient verbalized understanding of discharge instructions.

## 2019-02-25 NOTE — Assessment & Plan Note (Signed)
Minimally invasive and low risk for complications.

## 2019-02-25 NOTE — ED Notes (Signed)
Pt seated in lobby using phone while its plugged into charger, pt asked how she was doing and states that she is still hurting and that its been about an hour since her x-ray, reassured that I would review to check for results, no distress noted, cont to monitor

## 2019-02-25 NOTE — ED Provider Notes (Signed)
[facility] Emergency Department Provider Note  ____________________________________________  Time seen: Approximately 1:54 PM  I have reviewed the triage vital signs and the nursing notes.   HISTORY  Chief Complaint Chest Pain and Shortness of Breath    HPI Gina Harper is a 68 y.o. female with a history of GERD and recent right rotator cuff surgery who comes to the ED complaining of  pain in the left side of the chest radiating to the back for the past 3 days, constant, waxing and waning, gradual onset.  Worse with deep breathing and with movement.  Denies shortness of breath or cough.  No fevers or chills or body aches.  Denies recent travel trauma or history of DVT or PE.     Past Medical History:  Diagnosis Date  . 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 Allergic rhinitis, cause unspecified   . Allergy   . Alopecia, unspecified   . Anemia    pernicious anemia  . Arthritis    hands, knees, hips  . Cancer ([place])    melanoma  . Chicken pox   . Depressive disorder, not elsewhere classified   . Disorder of bone and cartilage, unspecified   . Esophageal reflux   . GERD (gastroesophageal reflux disease)   . Hepatitis   . History of shingles   . Insomnia, unspecified   . Measles    red measles  . Motion sickness   . Mumps   . Other abnormal glucose   . Palpitations   . Syncope, vasovagal   . Tobacco use disorder   . Unspecified asthma(493.90)    exercise induced. Never hospitalized  . Unspecified essential hypertension   . Unspecified hearing loss   . Unspecified pruritic disorder      Patient Active Problem List   Diagnosis Date Noted  . Right lower quadrant pain 07/22/2018  . Pleuritic chest pain 01/28/2018  . Chronic insomnia 08/06/2016  . Osteopenia 01/14/2016  . Abnormal mammogram of right breast 12/08/2015  . Venous insufficiency, peripheral 11/18/2015  . B12 deficiency 11/18/2015   . Seasonal allergies 11/18/2015  . Chronic dermatitis of hands 01/31/2014  . Peripheral neuropathy 01/31/2014  . Prediabetes 11/08/2013  . Routine general medical examination at a health care facility 08/15/2012  . Major depressive disorder, single episode 04/05/2012  . Pure hypercholesterolemia 04/05/2012  . Atrophic vaginitis 04/05/2012  . Elevated CEA 04/05/2012  . Exercise-induced asthma 04/05/2012  . Essential hypertension, benign 04/05/2012     Past Surgical History:  Procedure Laterality Date  . bladder tact  1995  . BROW LIFT Bilateral 07/09/2015   Procedure: BLEPHAROPLASTY;  Surgeon: Karle Starch, MD;  Location: [place];  Service: Ophthalmology;  Laterality: Bilateral;  . BUNIONECTOMY  [id number]   bilateral  . colonoscopy  04/20/2004   no polyps; normal.  Repeat in 10 years.  [place].  Marland Kitchen COLONOSCOPY WITH PROPOFOL N/A 05/20/2015   Procedure: COLONOSCOPY WITH PROPOFOL;  Surgeon: Lollie Sails, MD;  Location: [facility] END[facility]COPY;  Service: Endoscopy;  Laterality: N/A;  . COSMETIC SURGERY     rhinoplasty  . ESOPHAGOGASTRODUODENOSCOPY  03/30/2005  . PTOSIS REPAIR Bilateral 07/09/2015   Procedure: PTOSIS REPAIR;  Surgeon: Karle Starch, MD;  Location: [place];  Service: Ophthalmology;  Laterality: Bilateral;  . rhinoplasty with septoplasty    . TONSILLECTOMY AND ADENOIDECTOMY    . TUBAL LIGATION    . VAGINAL HYSTERECTOMY  1997   menorrhagia  ovaries intact     Prior to Admission medications   Medication Sig Start Date End Date Taking? Authorizing Provider  albuterol (PROAIR HFA) 108 (90 Base) MCG/ACT inhaler Inhale 2 puffs into the lungs every 6 (six) hours as needed. Dispense:  3 inhalers 01/28/18   Bedsole, Amy E, MD  Biotin 10000 MCG TABS Take 1 tablet by mouth daily.    [provider]  cetirizine (ZYRTEC) 10 MG tablet Take 10 mg by mouth as needed.     [provider]  Cholecalciferol (VITAMIN D3) 2000 UNITS TABS Take by mouth  daily.     [provider]  docusate sodium (COLACE) 100 MG capsule Take 100 mg by mouth daily.    [provider]  estradiol (ESTRACE VAGINAL) 0.1 MG/GM vaginal cream Place 1 Applicatorful vaginally daily as needed. 01/28/18   Bedsole, Amy E, MD  eszopiclone 3 MG TABS 1/2 tablet po immediately before bedtime daily prn insomnia 09/03/16   Bedsole, Amy E, MD  FLUoxetine (PROZAC) 20 MG capsule Take 1 capsule (20 mg total) by mouth daily. 01/31/19   Bedsole, Amy E, MD  hydrochlorothiazide (HYDRODIURIL) 25 MG tablet Take 1 tablet (25 mg total) by mouth daily. 01/31/19   Jinny Sanders, MD  Multiple Vitamin (MULTI-VITAMINS) TABS Take by mouth daily.    [provider]  Potassium Gluconate 550 MG TABS Take by mouth daily.     [provider]  promethazine (PHENERGAN) 25 MG suppository Place 1 suppository (25 mg total) rectally every 6 (six) hours as needed. 01/28/18   Bedsole, Amy E, MD  vitamin B-12 (CYANOCOBALAMIN) 1000 MCG tablet Take 1,000 mcg by mouth daily.    [provider]     Allergies Cefdinir, Lisinopril, Meloxicam, Omnipen [ampicillin], Augmentin [amoxicillin-pot clavulanate], and Clarithromycin   Family History  Problem Relation Age of Onset  . Cancer Mother        Lung, Bladder  . Stroke Father 50       TIAs  . Heart disease Father 70       AMI x 2.  . Colon polyps Father   . Thyroid disease Sister   . Cancer Brother        lung  . Breast cancer Maternal Aunt 60  . Breast cancer Maternal Aunt     Social History Social History   Tobacco Use  . Smoking status: Former Smoker    Packs/day: 1.00    Years: 17.00    Pack years: 17.00    Types: Cigarettes    Quit date: 05/22/1988    Years since quitting: 30.7  . Smokeless tobacco: Never Used  . Tobacco comment: Quit 22 years ago, 1990  Substance Use Topics  . Alcohol use: Yes    Alcohol/week: 6.0 standard drinks    Types: 6 Standard drinks or equivalent per week    Comment:  moderate white wine 5 glasses per week  . Drug use: No    Review of Systems  Constitutional:   No fever or chills.  ENT:   No sore throat. No rhinorrhea. Cardiovascular: Positive as above chest pain without syncope. Respiratory:   No dyspnea or cough. Gastrointestinal:   Negative for abdominal pain, vomiting and diarrhea.  Musculoskeletal:   Negative for focal pain or swelling All other systems reviewed and are negative except as documented above in ROS and HPI.  ____________________________________________   PHYSICAL EXAM:  VITAL SIGNS: ED Triage Vitals  Enc Vitals Group     BP 02/25/19  [id number] 127/68     Pulse Rate 02/25/19 0828 75     Resp 02/25/19 0828 16     Temp 02/25/19 0828 98.9 F (37.2 C)     Temp Source 02/25/19 0828 Oral     SpO2 02/25/19 0828 99 %     Weight 02/25/19 0845 166 lb (75.3 kg)     Height 02/25/19 0845 5\' 2"  (1.575 m)     Head Circumference --      Peak Flow --      Pain Score 02/25/19 0844 5     Pain Loc --      Pain Edu? --      Excl. in [place]? --     Vital signs reviewed, nursing assessments reviewed.   Constitutional:   Alert and oriented. Non-toxic appearance. Eyes:   Conjunctivae are normal. EOMI. PERRL. ENT      Head:   Normocephalic and atraumatic.      Nose:   Wearing a mask.      Mouth/Throat:   Wearing a mask.      Neck:   No meningismus. Full ROM. Hematological/Lymphatic/Immunilogical:   No cervical lymphadenopathy. Cardiovascular:   RRR. Symmetric bilateral radial and DP pulses.  No murmurs. Cap refill less than 2 seconds. Respiratory:   Normal respiratory effort without tachypnea/retractions. Breath sounds are clear and equal bilaterally. No wheezes/rales/rhonchi. Gastrointestinal:   Soft and nontender. Non distended. There is no CVA tenderness.  No rebound, rigidity, or guarding.  Musculoskeletal:   Normal range of motion in all extremities. No joint effusions.  No lower extremity tenderness.  No edema.  Chest wall nontender to  the touch as demonstrated by the patient over the area of the left breast Neurologic:   Normal speech and language.  Motor grossly intact. No acute focal neurologic deficits are appreciated.  Skin:    Skin is warm, dry and intact. No rash noted.  No petechiae, purpura, or bullae.  ____________________________________________    LABS (pertinent positives/negatives) (all labs ordered are listed, but only abnormal results are displayed) Labs Reviewed  BASIC METABOLIC PANEL - Abnormal; Notable for the following components:      Result Value   Glucose, Bld 130 (*)    Anion gap 16 (*)    All other components within normal limits  CBC  TROPONIN I (HIGH SENSITIVITY)  TROPONIN I (HIGH SENSITIVITY)   ____________________________________________   EKG  Interpreted by me  Date: 02/25/2019  Rate: 66  Rhythm: normal sinus rhythm  QRS Axis: normal  Intervals: normal  ST/T Wave abnormalities: normal  Conduction Disutrbances: none  Narrative Interpretation: unremarkable      ____________________________________________    RADIOLOGY  Dg Chest 2 View  Result Date: 02/25/2019 CLINICAL DATA:  Pt is post op 15 days for rotator cuff surgery to the right shoulder, pt reports chest pain on the left side and now back pain for the past 3 days with some sob, pain is reproducible with movement. EXAM: CHEST - 2 VIEW COMPARISON:  Chest radiograph 01/28/2018 FINDINGS: Stable cardiomediastinal contours within normal limits. Small bandlike opacity at the left lung base favored to represent atelectasis. No focal consolidation bilaterally. No pneumothorax or pleural effusion. No acute finding in the visualized skeleton. IMPRESSION: Small bandlike opacity at the left lung base favored to represent atelectasis. No focal consolidation to suggest infection. Electronically Signed   By: Audie Pinto M.D.   On: 02/25/2019 10:15   Ct Angio Chest Pe W And/or Wo Contrast  Result Date:  02/25/2019 CLINICAL  DATA:  Patient with recent rotator cuff surgery. Chest pain on the left side and back. Evaluate for pulmonary embolus. EXAM: CT ANGIOGRAPHY CHEST WITH CONTRAST TECHNIQUE: Multidetector CT imaging of the chest was performed using the standard protocol during bolus administration of intravenous contrast. Multiplanar CT image reconstructions and MIPs were obtained to evaluate the vascular anatomy. CONTRAST:  98mL OMNIPAQUE IOHEXOL 350 MG/ML SOLN COMPARISON:  Chest radiograph earlier same day FINDINGS: Cardiovascular: Normal heart size. Trace fluid superior pericardial recess. Thoracic aortic vascular calcifications. Adequate opacification of the pulmonary arterial system. No evidence for acute pulmonary embolus. Mediastinum/Nodes: No enlarged axillary, mediastinal or hilar lymphadenopathy. Normal appearance of the esophagus. Lungs/Pleura: Central airways are patent. Dependent atelectasis within the bilateral lower lobes. 3 mm right upper lobe pulmonary nodule (image 15; series 6). Stable 2 mm peripheral right middle lobe nodule (image 52; series 6) compatible with benign process given stability over time. Small left pleural effusion. Upper Abdomen: No acute process. Musculoskeletal: Thoracic spine degenerative changes. No aggressive or acute appearing osseous lesions. Review of the MIP images confirms the above findings. IMPRESSION: 1. No evidence for acute pulmonary embolus. 2. 3 mm right upper lobe pulmonary nodule. No follow-up needed if patient is low-risk (and has no known or suspected primary neoplasm). Non-contrast chest CT can be considered in 12 months if patient is high-risk. This recommendation follows the consensus statement: Guidelines for Management of Incidental Pulmonary Nodules Detected on CT Images: From the Fleischner Society 2017; Radiology 2017; 284:228-243. 3. Small left pleural effusion. 4. Aortic atherosclerosis. Electronically Signed   By: Lovey Newcomer M.D.   On: 02/25/2019 13:29     ____________________________________________   PROCEDURES Procedures  ____________________________________________  DIFFERENTIAL DIAGNOSIS   Chest wall strain, pneumonia, pneumothorax, pleural effusion, pulmonary embolism  CLINICAL IMPRESSION / ASSESSMENT AND PLAN / ED COURSE  Medications ordered in the ED: Medications  iohexol (OMNIPAQUE) 350 MG/ML injection 75 mL (75 mLs Intravenous Contrast Given 02/25/19 1258)    Pertinent labs & imaging results that were available during my care of the patient were reviewed by me and considered in my medical decision making (see chart for details).  Gina Harper was evaluated in Emergency Department on 02/25/2019 for the symptoms described in the history of present illness. She was evaluated in the context of the global COVID-19 pandemic, which necessitated consideration that the patient might be at risk for infection with the SARS-CoV-2 virus that causes COVID-19. Institutional protocols and algorithms that pertain to the evaluation of patients at risk for COVID-19 are in a state of rapid change based on information released by regulatory bodies including the CDC and federal and state organizations. These policies and algorithms were followed during the patient's care in the ED.   Patient presents with pleuritic chest pain, doubt ACS dissection AAA or pericarditis.  She is nontoxic.  Vital signs are normal, but without this being clearly musculoskeletal on exam and with her recent surgery and the pleuritic nature, CT is needed to rule out PE.  ----------------------------------------- 1:58 PM on 02/25/2019 -----------------------------------------  Serial troponins are negative.  CT is negative for PE or any other pulmonary or thoracic findings.  Most likely musculoskeletal, follow-up with primary care.      ____________________________________________   FINAL CLINICAL IMPRESSION(S) / ED DIAGNOSES    Final diagnoses:  Chest wall  pain  Atypical chest pain     ED Discharge Orders    None      Portions of this note were generated  with [employer]. Dictation errors may occur despite best attempts at proofreading.   Carrie Mew, MD 02/25/19 [phone]

## 2019-02-25 NOTE — Discharge Instructions (Addendum)
Your lab tests and CT scan of the chest were okay today.  There is no sign of pneumonia or blood clot in the lungs.  This is most likely due to chest wall strain which can be treated with heating pad and anti-inflammatory medicine.  Continue following up with your doctor to monitor your symptoms.

## 2019-02-25 NOTE — ED Notes (Signed)
Patient transported to CT 

## 2019-02-27 ENCOUNTER — Telehealth: Payer: Self-pay

## 2019-02-27 NOTE — Telephone Encounter (Signed)
[facility] Night - Client TELEPHONE ADVICE RECORD [facility] Patient Name: Gina Harper Gender: Female DOB: August 08, 1950 Age: 68 Y 25 M 21 D Return Phone Number: [phone] (Primary), [phone] (Secondary) Address: City/State/Zip: [place] [address] Client [facility] Night - Client Client Site [place] Physician Eliezer Lofts - MD Contact Type Call Who Is Calling Patient / Member / Family / Caregiver Call Type Triage / Clinical Relationship To Patient Self Return Phone Number [phone] (Primary) Chief Complaint CHEST PAIN (>=21 years) - pain, pressure, heaviness or tightness Reason for Call Symptomatic / Request for Health Information Initial Comment Pt is having persistent chest pain and has trouble getting a deep breath. Translation No Nurse Assessment Nurse: Laurena Bering, RN, Helene Kelp Date/Time Eilene Ghazi Time): 02/25/2019 6:57:14 AM Confirm and document reason for call. If symptomatic, describe symptoms. ---Caller states that she had rotator cuff surgery a couple weeks ago. Left side chest pain that is intermittent. Can' take deep breath. Rates pain as 4-5/10. Has the patient had close contact with a person known or suspected to have the novel coronavirus illness OR traveled / lives in area with major community spread (including international travel) in the last 14 days from the onset of symptoms? * If Asymptomatic, screen for exposure and travel within the last 14 days. ---No Does the patient have any new or worsening symptoms? ---Yes Will a triage be completed? ---Yes Related visit to physician within the last 2 weeks? ---No Does the PT have any chronic conditions? (i.e. diabetes, asthma, this includes High risk factors for pregnancy, etc.) ---Yes List chronic conditions. ---hypertension, fluid retention Is this a behavioral health or substance abuse call? ---No Guidelines Guideline Title  Affirmed Question Affirmed Notes Nurse Date/Time Eilene Ghazi Time) Chest Pain Difficulty breathing Laurena Bering, RN, Helene Kelp 02/25/2019 7:00:37 AM Disp. Time Eilene Ghazi Time) Disposition Final User 02/25/2019 6:55:10 AM Send to Urgent Leverne Humbles 02/25/2019 7:02:08 AM Go to ED Now Yes Laurena Bering, RN, Helene Kelp PLEASE NOTE: All timestamps contained within this report are represented as [place] Standard Time. CONFIDENTIALTY NOTICE: This fax transmission is intended only for the addressee. It contains information that is legally privileged, confidential or otherwise protected from use or disclosure. If you are not the intended recipient, you are strictly prohibited from reviewing, disclosing, copying using or disseminating any of this information or taking any action in reliance on or regarding this information. If you have received this fax in error, please notify us immediately by telephone so that we can arrange for its return to [place]. Phone: [phone], Toll-Free: [phone], Fax: [phone] Page: 2 of 2 Call Id: [id number] Caller Disagree/Comply Comply Caller Understands Yes PreDisposition Call Doctor Care Advice Given Per Guideline GO TO ED NOW: * You need to be seen in the Emergency Department. * Go to the ED at ___________ [place] now. Drive carefully. NOTE TO TRIAGER - DRIVING: * Another adult should drive. * If immediate transportation is not available via car or taxi, then the patient should be instructed to call [id number]. BRING MEDICINES: * Please bring a list of your current medicines when you go to the Emergency Department (ER). * It is also a good idea to bring the pill bottles too. This will help the doctor to make certain you are taking the right medicines and the right dose. NOTHING BY MOUTH: Do not eat or drink anything for now. CALL EMS IF: * Severe difficulty breathing occurs * Passes out or becomes too weak to  stand * You become worse. CARE ADVICE given per Chest Pain (Adult)  guideline. Referrals [facility] - ED

## 2019-02-27 NOTE — Telephone Encounter (Signed)
Per chart review tab pt went to [facility] ED on 02/25/19

## 2019-03-02 DIAGNOSIS — M25611 Stiffness of right shoulder, not elsewhere classified: Secondary | ICD-10-CM | POA: Diagnosis not present

## 2019-03-02 DIAGNOSIS — M6281 Muscle weakness (generalized): Secondary | ICD-10-CM | POA: Diagnosis not present

## 2019-03-02 DIAGNOSIS — M25511 Pain in right shoulder: Secondary | ICD-10-CM | POA: Diagnosis not present

## 2019-03-07 ENCOUNTER — Other Ambulatory Visit: Payer: Self-pay

## 2019-03-07 ENCOUNTER — Telehealth: Payer: Self-pay | Admitting: Family Medicine

## 2019-03-07 ENCOUNTER — Other Ambulatory Visit (INDEPENDENT_AMBULATORY_CARE_PROVIDER_SITE_OTHER): Payer: Medicare Other

## 2019-03-07 ENCOUNTER — Ambulatory Visit (INDEPENDENT_AMBULATORY_CARE_PROVIDER_SITE_OTHER): Payer: Medicare Other

## 2019-03-07 DIAGNOSIS — R7303 Prediabetes: Secondary | ICD-10-CM | POA: Diagnosis not present

## 2019-03-07 DIAGNOSIS — E538 Deficiency of other specified B group vitamins: Secondary | ICD-10-CM | POA: Diagnosis not present

## 2019-03-07 DIAGNOSIS — Z0000 Encounter for general adult medical examination without abnormal findings: Secondary | ICD-10-CM

## 2019-03-07 DIAGNOSIS — E7800 Pure hypercholesterolemia, unspecified: Secondary | ICD-10-CM

## 2019-03-07 LAB — COMPREHENSIVE METABOLIC PANEL
ALT: 21 U/L (ref 0–35)
AST: 16 U/L (ref 0–37)
Albumin: 4.5 g/dL (ref 3.5–5.2)
Alkaline Phosphatase: 74 U/L (ref 39–117)
BUN: 15 mg/dL (ref 6–23)
CO2: 29 mEq/L (ref 19–32)
Calcium: 9.3 mg/dL (ref 8.4–10.5)
Chloride: 103 mEq/L (ref 96–112)
Creatinine, Ser: 0.64 mg/dL (ref 0.40–1.20)
GFR: 91.96 mL/min (ref >60.00)
Glucose, Bld: 112 mg/dL — ABNORMAL HIGH (ref 70–99)
Potassium: 3.7 mEq/L (ref 3.5–5.1)
Sodium: 140 mEq/L (ref 135–145)
Total Bilirubin: 0.4 mg/dL (ref 0.2–1.2)
Total Protein: 7.2 g/dL (ref 6.0–8.3)

## 2019-03-07 LAB — LIPID PANEL
Cholesterol: 175 mg/dL (ref 0–200)
HDL: 46.3 mg/dL (ref >39.00)
LDL Cholesterol: 111 mg/dL — ABNORMAL HIGH (ref 0–99)
NonHDL: 129.19
Total CHOL/HDL Ratio: 4
Triglycerides: 91 mg/dL (ref 0.0–149.0)
VLDL: 18.2 mg/dL (ref 0.0–40.0)

## 2019-03-07 LAB — HEMOGLOBIN A1C: Hgb A1c MFr Bld: 5.7 % (ref 4.6–6.5)

## 2019-03-07 LAB — VITAMIN B12: Vitamin B-12: 665 pg/mL (ref 211–911)

## 2019-03-07 NOTE — Progress Notes (Addendum)
PCP notes:  Health Maintenance: Will check with insurance regarding shingrix   Abnormal Screenings: none   Patient concerns: none   Nurse concerns: none   Next PCP appt.: 03/10/2019 @ 3 pm       I reviewed health advisor's note, was available for consultation, and agree with documentation and plan.  Eliezer Lofts, MD [facility]

## 2019-03-07 NOTE — Progress Notes (Signed)
Subjective:   Gina Harper is a 68 y.o. female who presents for an Initial Medicare Annual Wellness Visit.  Review of Systems: N/A     This visit is being conducted through telemedicine via telephone at the nurse health advisor's home address due to the COVID-19 pandemic. This patient has given me verbal consent via doximity to conduct this visit, patient states they are participating from their home address. Patient and myself are on the telephone call. There is no referral for this visit. Some vital signs may be absent or patient reported.    Patient identification: identified by name, DOB, and current address    Cardiac Risk Factors include: advanced age (>34men, >24 women);hypertension;Other (see comment), Risk factor comments: hypercholesterolemia     Objective:    Today's Vitals   There is no height or weight on file to calculate BMI.  Advanced Directives 03/07/2019 02/25/2019 07/09/2015 05/20/2015  Does Patient Have a Medical Advance Directive? Yes Yes Yes Yes  Type of Paramedic of [place];Living will [place];Living will - -  Copy of [facility] in Chart? No - copy requested - No - copy requested -    Current Medications (verified) Outpatient Encounter Medications as of 03/07/2019  Medication Sig  . albuterol (PROAIR HFA) 108 (90 Base) MCG/ACT inhaler Inhale 2 puffs into the lungs every 6 (six) hours as needed. Dispense:  3 inhalers  . Biotin 10000 MCG TABS Take 1 tablet by mouth daily.  . cetirizine (ZYRTEC) 10 MG tablet Take 10 mg by mouth as needed.   . Cholecalciferol (VITAMIN D3) 2000 UNITS TABS Take by mouth daily.   Marland Kitchen docusate sodium (COLACE) 100 MG capsule Take 100 mg by mouth daily.  Marland Kitchen estradiol (ESTRACE VAGINAL) 0.1 MG/GM vaginal cream Place 1 Applicatorful vaginally daily as needed.  . eszopiclone 3 MG TABS 1/2 tablet po immediately before bedtime daily prn insomnia  . FLUoxetine (PROZAC) 20 MG  capsule Take 1 capsule (20 mg total) by mouth daily.  . hydrochlorothiazide (HYDRODIURIL) 25 MG tablet Take 1 tablet (25 mg total) by mouth daily.  . Multiple Vitamin (MULTI-VITAMINS) TABS Take by mouth daily.  . Potassium Gluconate 550 MG TABS Take by mouth daily.   . promethazine (PHENERGAN) 25 MG suppository Place 1 suppository (25 mg total) rectally every 6 (six) hours as needed.  . vitamin B-12 (CYANOCOBALAMIN) 1000 MCG tablet Take 1,000 mcg by mouth daily.   No facility-administered encounter medications on file as of 03/07/2019.     Allergies (verified) Cefdinir, Lisinopril, Meloxicam, Omnipen [ampicillin], Augmentin [amoxicillin-pot clavulanate], and Clarithromycin   History: Past Medical History:  Diagnosis Date  . 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 Allergic rhinitis, cause unspecified   . Allergy   . Alopecia, unspecified   . Anemia    pernicious anemia  . Arthritis    hands, knees, hips  . Cancer ([place])    melanoma  . Chicken pox   . Depressive disorder, not elsewhere classified   . Disorder of bone and cartilage, unspecified   . Esophageal reflux   . GERD (gastroesophageal reflux disease)   . Hepatitis   . History of shingles   . Insomnia, unspecified   . Measles    red measles  . Motion sickness   . Mumps   . Other abnormal glucose   . Palpitations   . Syncope, vasovagal   . Tobacco use disorder   .  Unspecified asthma(493.90)    exercise induced. Never hospitalized  . Unspecified essential hypertension   . Unspecified hearing loss   . Unspecified pruritic disorder    Past Surgical History:  Procedure Laterality Date  . bladder tact  1995  . BROW LIFT Bilateral 07/09/2015   Procedure: BLEPHAROPLASTY;  Surgeon: Karle Starch, MD;  Location: [facility];  Service: Ophthalmology;  Laterality: Bilateral;  . BUNIONECTOMY  [id number]   bilateral  . colonoscopy  04/20/2004   no polyps; normal.  Repeat in 10  years.  [place].  Marland Kitchen COLONOSCOPY WITH PROPOFOL N/A 05/20/2015   Procedure: COLONOSCOPY WITH PROPOFOL;  Surgeon: Lollie Sails, MD;  Location[facility] ENDOSCOPY;  Service: Endoscopy;  Laterality: N/A;  . COSMETIC SURGERY     rhinoplasty  . ESOPHAGOGASTRODUODENOSCOPY  03/30/2005  . PTOSIS REPAIR Bilateral 07/09/2015   Procedure: PTOSIS REPAIR;  Surgeon: Karle Starch, MD;  Location: [facility];  Service: Ophthalmology;  Laterality: Bilateral;  . rhinoplasty with septoplasty    . TONSILLECTOMY AND ADENOIDECTOMY    . TUBAL LIGATION    . VAGINAL HYSTERECTOMY  1997   menorrhagia ovaries intact   Family History  Problem Relation Age of Onset  . Cancer Mother        Lung, Bladder  . Stroke Father 40       TIAs  . Heart disease Father 13       AMI x 2.  . Colon polyps Father   . Thyroid disease Sister   . Cancer Brother        lung  . Breast cancer Maternal Aunt 60  . Breast cancer Maternal Aunt    Social History   Socioeconomic History  . Marital status: Married    Spouse name: Not on file  . Number of children: 1  . Years of education: college  . Highest education level: Not on file  Occupational History  . Occupation: Nurse    Comment: [employer]  pre [employer] de[employer].  Social Needs  . Financial resource strain: Not hard at all  . Food insecurity    Worry: Never true    Inability: Never true  . Transportation needs    Medical: No    Non-medical: No  Tobacco Use  . Smoking status: Former Smoker    Packs/day: 1.00    Years: 17.00    Pack years: 17.00    Types: Cigarettes    Quit date: 05/22/1988    Years since quitting: 30.8  . Smokeless tobacco: Never Used  . Tobacco comment: Quit 22 years ago, 1990  Substance and Sexual Activity  . Alcohol use: Yes    Alcohol/week: 6.0 standard drinks    Types: 6 Standard drinks or equivalent per week    Comment: moderate white wine 5 glasses per week  . Drug use: No  . Sexual activity: Yes    Partners: Male    Birth  control/protection: Post-menopausal, Surgical  Lifestyle  . Physical activity    Days per week: 0 days    Minutes per session: 0 min  . Stress: Not at all  Relationships  . Social Herbalist on phone: Not on file    Gets together: Not on file    Attends religious service: Not on file    Active member of club or organization: Not on file    Attends meetings of clubs or organizations: Not on file    Relationship status: Not on file  Other Topics Concern  .  Not on file  Social History Narrative      Marital status:  Married x 40 years, happily married; no domestic abuse.      Children:  1 child; 3 grandchildren local.      Employment:  Retired in 09/2012; [place] x 21 years; happy.  Pre-admission testing.      Tobacco:  Previous smoker. Quit 1990      Alcohol:  2 glasses of wine three nights per week.      Drugs; none      Exercise:  Treadmill, elliptical, Zumba.  [employer] 3x per week.      Seatbelt:  Always uses seat belts.       +Smoke alarm and carbon monoxide detector in the home.       Guns:  Guns stored in locked cabinet.       Caffeine use: Coffee, Tea, Carbonated beverages, 3 servings / day.                  Tobacco Counseling Counseling given: Not Answered Comment: Quit 22 years ago, 1990   Clinical Intake:  Pre-visit preparation completed: Yes  Pain : No/denies pain     Nutritional Risks: None Diabetes: No  How often do you need to have someone help you when you read instructions, pamphlets, or other written materials from your doctor or pharmacy?: 1 - Never What is the last grade level you completed in school?: nursing degress- associates  Interpreter Needed?: No  Information entered by :: CJohnson, LPN   Activities of Daily Living In your present state of health, do you have any difficulty performing the following activities: 03/07/2019  Hearing? Y  Comment some hearing loss noted  Vision? Y  Comment patient has cataracts  Difficulty  concentrating or making decisions? Y  Comment some short term memory loss  Walking or climbing stairs? N  Dressing or bathing? N  Doing errands, shopping? N  Preparing Food and eating ? N  Using the Toilet? N  In the past six months, have you accidently leaked urine? Y  Comment some urine leakage issues- does not wear protection  Do you have problems with loss of bowel control? N  Managing your Medications? N  Managing your Finances? N  Housekeeping or managing your Housekeeping? N  Some recent data might be hidden     Immunizations and Health Maintenance Immunization History  Administered Date(s) Administered  . Fluad Quad(high Dose 65+) 01/31/2019  . Influenza Split 01/19/2011  . Influenza,inj,Quad PF,6+ Mos 02/17/2013, 01/14/2016, 01/26/2017, 01/28/2018  . Influenza-Unspecified 02/14/2014, 02/01/2015  . Pneumococcal Conjugate-13 07/02/2015  . Pneumococcal Polysaccharide-23 01/26/2017  . Tdap 04/20/2008  . Zoster 04/17/2013   There are no preventive care reminders to display for this patient.  Patient Care Team: Jinny Sanders, MD as PCP - General (Family Medicine) Defrancesco, Alanda Slim, MD (Obstetrics and Gynecology)  Indicate any recent Medical Services you may have received from other than [facility] providers in the past year (date may be approximate).     Assessment:   This is a routine wellness examination for Gina Harper.  Hearing/Vision screen  Hearing Screening   125Hz  250Hz  500Hz  1000Hz  2000Hz  3000Hz  4000Hz  6000Hz  8000Hz   Right ear:           Left ear:           Vision Screening Comments: Patient gets annual eye exams   Dietary issues and exercise activities discussed: Current Exercise Habits: Home exercise routine, Type of exercise: strength training/weights, Time (  Minutes): 30, Frequency (Times/Week): 3, Weekly Exercise (Minutes/Week): 90, Intensity: Mild, Exercise limited by: None identified  Goals    . Patient Stated     03/07/2019, I will work on losing some  weight and eating a healthier diet.       Depression Screen PHQ 2/9 Scores 03/07/2019 01/28/2018 01/26/2017 08/06/2016 01/14/2016 01/31/2014 11/08/2013  PHQ - 2 Score 0 0 0 1 0 0 0  PHQ- 9 Score 0 - - 6 - - -    Fall Risk Fall Risk  03/07/2019 01/28/2018 01/26/2017 01/14/2016 01/31/2014  Falls in the past year? 0 Yes Yes Yes No  Number falls in past yr: 0 1 2 or more 2 or more -  Injury with Fall? 0 No No Yes -  Risk for fall due to : Medication side effect - - - -  Follow up Falls evaluation completed;Falls prevention discussed - - - -    Is the patient's home free of loose throw rugs in walkways, pet beds, electrical cords, etc?   yes      Grab bars in the bathroom? no      Handrails on the stairs?   yes      Adequate lighting?   yes  Timed Get Up and Go Performed N/A  Cognitive Function: MMSE - Mini Mental State Exam 03/07/2019  Orientation to time 5  Orientation to Place 5  Registration 3  Attention/ Calculation 5  Recall 3  Language- repeat 1       Mini Cog  Mini-Cog screen was completed. Maximum score is 22. A value of 0 denotes this part of the MMSE was not completed or the patient failed this part of the Mini-Cog screening.  Screening Tests Health Maintenance  Topic Date Due  . TETANUS/TDAP  01/31/2020 (Originally 04/20/2018)  . MAMMOGRAM  12/11/2020  . COLONOSCOPY  05/19/2025  . INFLUENZA VACCINE  Completed  . DEXA SCAN  Completed  . Hepatitis C Screening  Completed  . PNA vac Low Risk Adult  Completed    Qualifies for Shingles Vaccine? Yes  Cancer Screenings: Lung: Low Dose CT Chest recommended if Age 70-80 years, 30 pack-year currently smoking OR have quit w/in 15years. Patient does not qualify. Breast: Up to date on Mammogram? Yes, completed 12/12/2018   Up to date of Bone Density/Dexa? Yes, completed 02/06/2016 Colorectal: completed 05/20/2015  Additional Screenings:  Hepatitis C Screening: 01/09/2016     Plan:    Patient wants to lose some weight  and eat a healthier diet.   I have personally reviewed and noted the following in the patient's chart:   . Medical and social history . Use of alcohol, tobacco or illicit drugs  . Current medications and supplements . Functional ability and status . Nutritional status . Physical activity . Advanced directives . List of other physicians . Hospitalizations, surgeries, and ER visits in previous 12 months . Vitals . Screenings to include cognitive, depression, and falls . Referrals and appointments  In addition, I have reviewed and discussed with patient certain preventive protocols, quality metrics, and best practice recommendations. A written personalized care plan for preventive services as well as general preventive health recommendations were provided to patient.     Andrez Grime, LPN   [id number]

## 2019-03-07 NOTE — Progress Notes (Signed)
No critical labs need to be addressed urgently. We will discuss labs in detail at upcoming office visit.   

## 2019-03-07 NOTE — Telephone Encounter (Signed)
-----   Message from Ellamae Sia sent at 02/27/2019  4:25 PM EST ----- Regarding: Lab orders for Tuesday, 11.17.20 Patient is scheduled for CPX labs, please order future labs, Thanks , Karna Christmas

## 2019-03-07 NOTE — Patient Instructions (Signed)
Gina Harper , Thank you for taking time to come for your Medicare Wellness Visit. I appreciate your ongoing commitment to your health goals. Please review the following plan we discussed and let me know if I can assist you in the future.   Screening recommendations/referrals: Colonoscopy: Up to date, completed 05/20/2015 Mammogram: Up to date, completed 12/12/2018 Bone Density: Up to date, completed 02/06/2016 Recommended yearly ophthalmology/optometry visit for glaucoma screening and checkup Recommended yearly dental visit for hygiene and checkup  Vaccinations: Influenza vaccine: Up to date, completed 01/31/2019 Pneumococcal vaccine: Completed series Tdap vaccine: decline Shingles vaccine: will check with insurance    Advanced directives: Please bring a copy of your POA (Power of Attorney) and/or Living Will to your next appointment.   Conditions/risks identified: hypertension, hypercholesterolemia  Next appointment: 03/10/2019 @ 3 pm    Preventive Care 63 Years and Older, Female Preventive care refers to lifestyle choices and visits with your health care provider that can promote health and wellness. What does preventive care include?  A yearly physical exam. This is also called an annual well check.  Dental exams once or twice a year.  Routine eye exams. Ask your health care provider how often you should have your eyes checked.  Personal lifestyle choices, including:  Daily care of your teeth and gums.  Regular physical activity.  Eating a healthy diet.  Avoiding tobacco and drug use.  Limiting alcohol use.  Practicing safe sex.  Taking low-dose aspirin every day.  Taking vitamin and mineral supplements as recommended by your health care provider. What happens during an annual well check? The services and screenings done by your health care provider during your annual well check will depend on your age, overall health, lifestyle risk factors, and family history of  disease. Counseling  Your health care provider may ask you questions about your:  Alcohol use.  Tobacco use.  Drug use.  Emotional well-being.  Home and relationship well-being.  Sexual activity.  Eating habits.  History of falls.  Memory and ability to understand (cognition).  Work and work Statistician.  Reproductive health. Screening  You may have the following tests or measurements:  Height, weight, and BMI.  Blood pressure.  Lipid and cholesterol levels. These may be checked every 5 years, or more frequently if you are over 33 years old.  Skin check.  Lung cancer screening. You may have this screening every year starting at age 72 if you have a 30-pack-year history of smoking and currently smoke or have quit within the past 15 years.  Fecal occult blood test (FOBT) of the stool. You may have this test every year starting at age 98.  Flexible sigmoidoscopy or colonoscopy. You may have a sigmoidoscopy every 5 years or a colonoscopy every 10 years starting at age 43.  Hepatitis C blood test.  Hepatitis B blood test.  Sexually transmitted disease (STD) testing.  Diabetes screening. This is done by checking your blood sugar (glucose) after you have not eaten for a while (fasting). You may have this done every 1-3 years.  Bone density scan. This is done to screen for osteoporosis. You may have this done starting at age 89.  Mammogram. This may be done every 1-2 years. Talk to your health care provider about how often you should have regular mammograms. Talk with your health care provider about your test results, treatment options, and if necessary, the need for more tests. Vaccines  Your health care provider may recommend certain vaccines, such as:  Influenza vaccine. This is recommended every year.  Tetanus, diphtheria, and acellular pertussis (Tdap, Td) vaccine. You may need a Td booster every 10 years.  Zoster vaccine. You may need this after age 49.   Pneumococcal 13-valent conjugate (PCV13) vaccine. One dose is recommended after age 37.  Pneumococcal polysaccharide (PPSV23) vaccine. One dose is recommended after age 49. Talk to your health care provider about which screenings and vaccines you need and how often you need them. This information is not intended to replace advice given to you by your health care provider. Make sure you discuss any questions you have with your health care provider. Document Released: 05/03/2015 Document Revised: 12/25/2015 Document Reviewed: 02/05/2015 Elsevier Interactive Patient Education  2017 [place] Prevention in the Home Falls can cause injuries. They can happen to people of all ages. There are many things you can do to make your home safe and to help prevent falls. What can I do on the outside of my home?  Regularly fix the edges of walkways and driveways and fix any cracks.  Remove anything that might make you trip as you walk through a door, such as a raised step or threshold.  Trim any bushes or trees on the path to your home.  Use bright outdoor lighting.  Clear any walking paths of anything that might make someone trip, such as rocks or tools.  Regularly check to see if handrails are loose or broken. Make sure that both sides of any steps have handrails.  Any raised decks and porches should have guardrails on the edges.  Have any leaves, snow, or ice cleared regularly.  Use sand or salt on walking paths during winter.  Clean up any spills in your garage right away. This includes oil or grease spills. What can I do in the bathroom?  Use night lights.  Install grab bars by the toilet and in the tub and shower. Do not use towel bars as grab bars.  Use non-skid mats or decals in the tub or shower.  If you need to sit down in the shower, use a plastic, non-slip stool.  Keep the floor dry. Clean up any water that spills on the floor as soon as it happens.  Remove soap  buildup in the tub or shower regularly.  Attach bath mats securely with double-sided non-slip rug tape.  Do not have throw rugs and other things on the floor that can make you trip. What can I do in the bedroom?  Use night lights.  Make sure that you have a light by your bed that is easy to reach.  Do not use any sheets or blankets that are too big for your bed. They should not hang down onto the floor.  Have a firm chair that has side arms. You can use this for support while you get dressed.  Do not have throw rugs and other things on the floor that can make you trip. What can I do in the kitchen?  Clean up any spills right away.  Avoid walking on wet floors.  Keep items that you use a lot in easy-to-reach places.  If you need to reach something above you, use a strong step stool that has a grab bar.  Keep electrical cords out of the way.  Do not use floor polish or wax that makes floors slippery. If you must use wax, use non-skid floor wax.  Do not have throw rugs and other things on the floor that  can make you trip. What can I do with my stairs?  Do not leave any items on the stairs.  Make sure that there are handrails on both sides of the stairs and use them. Fix handrails that are broken or loose. Make sure that handrails are as long as the stairways.  Check any carpeting to make sure that it is firmly attached to the stairs. Fix any carpet that is loose or worn.  Avoid having throw rugs at the top or bottom of the stairs. If you do have throw rugs, attach them to the floor with carpet tape.  Make sure that you have a light switch at the top of the stairs and the bottom of the stairs. If you do not have them, ask someone to add them for you. What else can I do to help prevent falls?  Wear shoes that:  Do not have high heels.  Have rubber bottoms.  Are comfortable and fit you well.  Are closed at the toe. Do not wear sandals.  If you use a stepladder:  Make  sure that it is fully opened. Do not climb a closed stepladder.  Make sure that both sides of the stepladder are locked into place.  Ask someone to hold it for you, if possible.  Clearly mark and make sure that you can see:  Any grab bars or handrails.  First and last steps.  Where the edge of each step is.  Use tools that help you move around (mobility aids) if they are needed. These include:  Canes.  Walkers.  Scooters.  Crutches.  Turn on the lights when you go into a dark area. Replace any light bulbs as soon as they burn out.  Set up your furniture so you have a clear path. Avoid moving your furniture around.  If any of your floors are uneven, fix them.  If there are any pets around you, be aware of where they are.  Review your medicines with your doctor. Some medicines can make you feel dizzy. This can increase your chance of falling. Ask your doctor what other things that you can do to help prevent falls. This information is not intended to replace advice given to you by your health care provider. Make sure you discuss any questions you have with your health care provider. Document Released: 01/31/2009 Document Revised: 09/12/2015 Document Reviewed: 05/11/2014 Elsevier Interactive Patient Education  2017 [employer].

## 2019-03-09 DIAGNOSIS — M25611 Stiffness of right shoulder, not elsewhere classified: Secondary | ICD-10-CM | POA: Diagnosis not present

## 2019-03-09 DIAGNOSIS — M6281 Muscle weakness (generalized): Secondary | ICD-10-CM | POA: Diagnosis not present

## 2019-03-09 DIAGNOSIS — M25511 Pain in right shoulder: Secondary | ICD-10-CM | POA: Diagnosis not present

## 2019-03-10 ENCOUNTER — Encounter: Payer: Self-pay | Admitting: Family Medicine

## 2019-03-10 ENCOUNTER — Other Ambulatory Visit: Payer: Self-pay

## 2019-03-10 ENCOUNTER — Ambulatory Visit (INDEPENDENT_AMBULATORY_CARE_PROVIDER_SITE_OTHER): Payer: Medicare Other | Admitting: Family Medicine

## 2019-03-10 VITALS — BP 122/70 | HR 76 | Temp 98.4°F | Ht 62.0 in | Wt 170.5 lb

## 2019-03-10 DIAGNOSIS — I700 Atherosclerosis of aorta: Secondary | ICD-10-CM | POA: Diagnosis not present

## 2019-03-10 DIAGNOSIS — E7800 Pure hypercholesterolemia, unspecified: Secondary | ICD-10-CM

## 2019-03-10 DIAGNOSIS — Z0000 Encounter for general adult medical examination without abnormal findings: Secondary | ICD-10-CM

## 2019-03-10 DIAGNOSIS — R911 Solitary pulmonary nodule: Secondary | ICD-10-CM | POA: Diagnosis not present

## 2019-03-10 DIAGNOSIS — I10 Essential (primary) hypertension: Secondary | ICD-10-CM | POA: Diagnosis not present

## 2019-03-10 DIAGNOSIS — F324 Major depressive disorder, single episode, in partial remission: Secondary | ICD-10-CM

## 2019-03-10 NOTE — Progress Notes (Signed)
Chief Complaint  Patient presents with  . Annual Exam    Part 2    History of Present Illness: HPI  The patient presents for  complete physical and review of chronic health problems. He/She also has the following acute concerns today: none  The patient saw a LPN or RN for medicare wellness visit.  Prevention and wellness was reviewed in detail. Note reviewed and important notes copied below. Health Maintenance: Will check with insurance regarding shingrix Abnormal Screenings: None  03/10/19   17 year pack history of tobacco use.. quit 30 years ago..  CT recently showed 3 mm right upper lobe pulmonary nodule. No follow-up needed if patient is low-risk (and has no known or suspected primary neoplasm). Non-contrast chest CT can be considered in 12 months if patient is high-risk.  Brother and mother with lung cancer.  Rotator cuff repair right shoulder Dr . Noemi Chapel 1 months ago. MDD, moderate Good control on prozac 20 mg daily. No insomnia and No SI.   Clinical Support from 03/07/2019 in [place] at [facility] Total Score  0     Hypertension:   Good control on HCTZ   BP Readings from Last 3 Encounters:  03/10/19 122/70  02/25/19 136/84  01/31/19 130/72  Using medication without problems or lightheadedness: none Chest pain with exertion:none Edema:none Short of breath:none Average home BPs: Other issues: was seen in ER for chest wall pain from using left arm more since surgery... resolved.  Elevated Cholesterol:  AHA 10 year risk Calc for CVD 10.3% LDL worsened in last year Lab Results  Component Value Date   CHOL 175 03/07/2019   HDL 46.30 03/07/2019   LDLCALC 111 (H) 03/07/2019   TRIG 91.0 03/07/2019   CHOLHDL 4 03/07/2019  Using medications without problems: Muscle aches:  Diet compliance: eating more comfort food.. back on weight  Exercise: daily Other complaints: Wt Readings from Last 3 Encounters:  03/10/19 170 lb 8 oz (77.3 kg)   02/25/19 166 lb (75.3 kg)  01/31/19 168 lb 2 oz (76.3 kg)      Idiopathic peripheral neuropathy: seen by Neuro in past. No clear cause per lab work. Continued despite B12 supplement. She does have burning in feet off and on. Tolerable at this time on no med.  This visit occurred during the SARS-CoV-2 public health emergency.  Safety protocols were in place, including screening questions prior to the visit, additional usage of staff PPE, and extensive cleaning of exam room while observing appropriate contact time as indicated for disinfecting solutions.   COVID 19 screen:  No recent travel or known exposure to COVID19 The patient denies respiratory symptoms of COVID 19 at this time. The importance of social distancing was discussed today.     Review of Systems  Constitutional: Negative for chills and fever.  HENT: Negative for congestion and ear pain.   Eyes: Negative for pain and redness.  Respiratory: Negative for cough and shortness of breath.   Cardiovascular: Negative for chest pain, palpitations and leg swelling.  Gastrointestinal: Negative for abdominal pain, blood in stool, constipation, diarrhea, nausea and vomiting.  Genitourinary: Negative for dysuria.  Musculoskeletal: Negative for falls and myalgias.  Skin: Negative for rash.  Neurological: Negative for dizziness.  Psychiatric/Behavioral: Negative for depression. The patient is not nervous/anxious.       Past Medical History:  Diagnosis Date  . 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 Allergic rhinitis, cause unspecified   .  Allergy   . Alopecia, unspecified   . Anemia    pernicious anemia  . Arthritis    hands, knees, hips  . Cancer ([place])    melanoma  . Chicken pox   . Depressive disorder, not elsewhere classified   . Disorder of bone and cartilage, unspecified   . Esophageal reflux   . GERD (gastroesophageal reflux disease)   . Hepatitis   . History of shingles    . Insomnia, unspecified   . Measles    red measles  . Motion sickness   . Mumps   . Other abnormal glucose   . Palpitations   . Syncope, vasovagal   . Tobacco use disorder   . Unspecified asthma(493.90)    exercise induced. Never hospitalized  . Unspecified essential hypertension   . Unspecified hearing loss   . Unspecified pruritic disorder     reports that she quit smoking about 30 years ago. Her smoking use included cigarettes. She has a 17.00 pack-year smoking history. She has never used smokeless tobacco. She reports current alcohol use of about 6.0 standard drinks of alcohol per week. She reports that she does not use drugs.   Current Outpatient Medications:  .  albuterol (PROAIR HFA) 108 (90 Base) MCG/ACT inhaler, Inhale 2 puffs into the lungs every 6 (six) hours as needed. Dispense:  3 inhalers, Disp: 18 g, Rfl: 1 .  Biotin 10000 MCG TABS, Take 1 tablet by mouth daily., Disp: , Rfl:  .  cetirizine (ZYRTEC) 10 MG tablet, Take 10 mg by mouth as needed. , Disp: , Rfl:  .  Cholecalciferol (VITAMIN D3) 2000 UNITS TABS, Take by mouth daily. , Disp: , Rfl:  .  docusate sodium (COLACE) 100 MG capsule, Take 100 mg by mouth daily., Disp: , Rfl:  .  estradiol (ESTRACE VAGINAL) 0.1 MG/GM vaginal cream, Place 1 Applicatorful vaginally daily as needed., Disp: 42.5 g, Rfl: 11 .  FLUoxetine (PROZAC) 20 MG capsule, Take 1 capsule (20 mg total) by mouth daily., Disp: 90 capsule, Rfl: 1 .  hydrochlorothiazide (HYDRODIURIL) 25 MG tablet, Take 1 tablet (25 mg total) by mouth daily., Disp: 90 tablet, Rfl: 3 .  Multiple Vitamin (MULTI-VITAMINS) TABS, Take by mouth daily., Disp: , Rfl:  .  Potassium Gluconate 550 MG TABS, Take by mouth daily. , Disp: , Rfl:  .  promethazine (PHENERGAN) 25 MG suppository, Place 1 suppository (25 mg total) rectally every 6 (six) hours as needed., Disp: 12 each, Rfl: 1 .  vitamin B-12 (CYANOCOBALAMIN) 1000 MCG tablet, Take 1,000 mcg by mouth daily., Disp: , Rfl:     Observations/Objective: Blood pressure 122/70, pulse 76, temperature 98.4 F (36.9 C), temperature source Temporal, height 5\' 2"  (1.575 m), weight 170 lb 8 oz (77.3 kg), SpO2 97 %.  Physical Exam Constitutional:      General: She is not in acute distress.    Appearance: Normal appearance. She is well-developed. She is not ill-appearing or toxic-appearing.  HENT:     Head: Normocephalic.     Right Ear: Hearing, tympanic membrane, ear canal and external ear normal.     Left Ear: Hearing, tympanic membrane, ear canal and external ear normal.     Nose: Nose normal.  Eyes:     General: Lids are normal. Lids are everted, no foreign bodies appreciated.     Conjunctiva/sclera: Conjunctivae normal.     Pupils: Pupils are equal, round, and reactive to light.  Neck:     Musculoskeletal: Normal range of motion  and neck supple.     Thyroid: No thyroid mass or thyromegaly.     Vascular: No carotid bruit.     Trachea: Trachea normal.  Cardiovascular:     Rate and Rhythm: Normal rate and regular rhythm.     Heart sounds: Normal heart sounds, S1 normal and S2 normal. No murmur. No gallop.   Pulmonary:     Effort: Pulmonary effort is normal. No respiratory distress.     Breath sounds: Normal breath sounds. No wheezing, rhonchi or rales.  Abdominal:     General: Bowel sounds are normal. There is no distension or abdominal bruit.     Palpations: Abdomen is soft. There is no fluid wave or mass.     Tenderness: There is no abdominal tenderness. There is no guarding or rebound.     Hernia: No hernia is present.  Lymphadenopathy:     Cervical: No cervical adenopathy.  Skin:    General: Skin is warm and dry.     Findings: No rash.  Neurological:     Mental Status: She is alert.     Cranial Nerves: No cranial nerve deficit.     Sensory: No sensory deficit.  Psychiatric:        Mood and Affect: Mood is not anxious or depressed.        Speech: Speech normal.        Behavior: Behavior normal.  Behavior is cooperative.        Judgment: Judgment normal.      Assessment and Plan   The patient's preventative maintenance and recommended screening tests for an annual wellness exam were reviewed in full today. Brought up to date unless services declined.  Counselled on the importance of diet, exercise, and its role in overall health and mortality. The patient's FH and SH was reviewed, including their home life, tobacco status, and drug and alcohol status.   PAP not indicated given partial hysterectomy, no family history of ovarian cancer. Mammogram 11/2018 nml Colonoscopy 05/20/2015 polyp repeat in 10 years Flu vaccine,pneumonia uptodate Hep c neg  DEXA: 2017  Osteopenia, repeat due this year.. Will plan in 2020 with mammogram.  Essential hypertension, benign Well controlled. Continue current medication.   Major depressive disorder, single episode Well controlled. Continue current medication.   Lung nodule Plan 12 month repeat CT scan.  Aortic atherosclerosis ([place]) More aggressive risk factor management.   Eliezer Lofts, MD

## 2019-03-10 NOTE — Assessment & Plan Note (Signed)
Well controlled. Continue current medication.  

## 2019-03-10 NOTE — Assessment & Plan Note (Signed)
Plan 12 month repeat CT scan.

## 2019-03-10 NOTE — Patient Instructions (Addendum)
Work on low cholesterol and low carb diet. Increase exercise as able. Plan bone density next year with mammogram.

## 2019-03-10 NOTE — Assessment & Plan Note (Signed)
More aggressive risk factor management.

## 2019-03-21 DIAGNOSIS — M25611 Stiffness of right shoulder, not elsewhere classified: Secondary | ICD-10-CM | POA: Diagnosis not present

## 2019-03-21 DIAGNOSIS — M25511 Pain in right shoulder: Secondary | ICD-10-CM | POA: Diagnosis not present

## 2019-03-21 DIAGNOSIS — M6281 Muscle weakness (generalized): Secondary | ICD-10-CM | POA: Diagnosis not present

## 2019-03-28 DIAGNOSIS — M25611 Stiffness of right shoulder, not elsewhere classified: Secondary | ICD-10-CM | POA: Diagnosis not present

## 2019-03-28 DIAGNOSIS — M25511 Pain in right shoulder: Secondary | ICD-10-CM | POA: Diagnosis not present

## 2019-03-28 DIAGNOSIS — M6281 Muscle weakness (generalized): Secondary | ICD-10-CM | POA: Diagnosis not present

## 2019-04-06 DIAGNOSIS — M6281 Muscle weakness (generalized): Secondary | ICD-10-CM | POA: Diagnosis not present

## 2019-04-06 DIAGNOSIS — M25611 Stiffness of right shoulder, not elsewhere classified: Secondary | ICD-10-CM | POA: Diagnosis not present

## 2019-04-06 DIAGNOSIS — M25511 Pain in right shoulder: Secondary | ICD-10-CM | POA: Diagnosis not present

## 2019-04-11 DIAGNOSIS — M6281 Muscle weakness (generalized): Secondary | ICD-10-CM | POA: Diagnosis not present

## 2019-04-11 DIAGNOSIS — M25511 Pain in right shoulder: Secondary | ICD-10-CM | POA: Diagnosis not present

## 2019-04-11 DIAGNOSIS — M25611 Stiffness of right shoulder, not elsewhere classified: Secondary | ICD-10-CM | POA: Diagnosis not present

## 2019-04-20 DIAGNOSIS — M6281 Muscle weakness (generalized): Secondary | ICD-10-CM | POA: Diagnosis not present

## 2019-04-20 DIAGNOSIS — M25611 Stiffness of right shoulder, not elsewhere classified: Secondary | ICD-10-CM | POA: Diagnosis not present

## 2019-04-20 DIAGNOSIS — M25511 Pain in right shoulder: Secondary | ICD-10-CM | POA: Diagnosis not present

## 2019-04-25 DIAGNOSIS — M25511 Pain in right shoulder: Secondary | ICD-10-CM | POA: Diagnosis not present

## 2019-04-25 DIAGNOSIS — M25611 Stiffness of right shoulder, not elsewhere classified: Secondary | ICD-10-CM | POA: Diagnosis not present

## 2019-04-25 DIAGNOSIS — M6281 Muscle weakness (generalized): Secondary | ICD-10-CM | POA: Diagnosis not present

## 2019-05-08 DIAGNOSIS — M6281 Muscle weakness (generalized): Secondary | ICD-10-CM | POA: Diagnosis not present

## 2019-05-08 DIAGNOSIS — M25511 Pain in right shoulder: Secondary | ICD-10-CM | POA: Diagnosis not present

## 2019-05-08 DIAGNOSIS — M25611 Stiffness of right shoulder, not elsewhere classified: Secondary | ICD-10-CM | POA: Diagnosis not present

## 2019-05-17 ENCOUNTER — Ambulatory Visit: Payer: Medicare Other

## 2019-05-23 DIAGNOSIS — M25511 Pain in right shoulder: Secondary | ICD-10-CM | POA: Diagnosis not present

## 2019-05-26 ENCOUNTER — Ambulatory Visit: Payer: Medicare Other | Attending: Internal Medicine

## 2019-05-26 DIAGNOSIS — Z23 Encounter for immunization: Secondary | ICD-10-CM

## 2019-05-26 NOTE — Progress Notes (Signed)
   Covid-19 Vaccination Clinic  Name:  Gina Harper    MRN: [id number] DOB: February 04, 1951  05/26/2019  Gina Harper was observed post Covid-19 immunization for 15 minutes without incidence. She was provided with Vaccine Information Sheet and instruction to access the V-Safe system.   Gina Harper was instructed to call 911 with any severe reactions post vaccine: Marland Kitchen Difficulty breathing  . Swelling of your face and throat  . A fast heartbeat  . A bad rash all over your body  . Dizziness and weakness    Immunizations Administered    Name Date Dose VIS Date Route   Pfizer COVID-19 Vaccine 05/26/2019  9:16 AM 0.3 mL 03/31/2019 Intramuscular   Manufacturer: [place]   Lot: [id number]   [place]: [id number]

## 2019-06-07 ENCOUNTER — Ambulatory Visit: Payer: Medicare Other

## 2019-06-09 ENCOUNTER — Other Ambulatory Visit: Payer: Medicare Other

## 2019-06-12 ENCOUNTER — Other Ambulatory Visit: Payer: Self-pay

## 2019-06-12 ENCOUNTER — Other Ambulatory Visit (INDEPENDENT_AMBULATORY_CARE_PROVIDER_SITE_OTHER): Payer: Medicare Other

## 2019-06-12 DIAGNOSIS — E7800 Pure hypercholesterolemia, unspecified: Secondary | ICD-10-CM

## 2019-06-12 LAB — LIPID PANEL
Cholesterol: 179 mg/dL (ref 0–200)
HDL: 51.9 mg/dL (ref >39.00)
LDL Cholesterol: 101 mg/dL — ABNORMAL HIGH (ref 0–99)
NonHDL: 127.02
Total CHOL/HDL Ratio: 3
Triglycerides: 132 mg/dL (ref 0.0–149.0)
VLDL: 26.4 mg/dL (ref 0.0–40.0)

## 2019-06-13 NOTE — Progress Notes (Signed)
No critical labs need to be addressed urgently. We will discuss labs in detail at upcoming office visit.   

## 2019-06-17 ENCOUNTER — Other Ambulatory Visit: Payer: Self-pay | Admitting: Family Medicine

## 2019-06-21 ENCOUNTER — Ambulatory Visit: Payer: Medicare Other | Attending: Internal Medicine

## 2019-06-21 DIAGNOSIS — Z23 Encounter for immunization: Secondary | ICD-10-CM

## 2019-06-21 NOTE — Progress Notes (Signed)
   Covid-19 Vaccination Clinic  Name:  Gina Harper    MRN: [id number] DOB: 01-Feb-1951  06/21/2019  Gina Harper was observed post Covid-19 immunization for 15 minutes without incident. She was provided with Vaccine Information Sheet and instruction to access the V-Safe system.   Gina Harper was instructed to call 911 with any severe reactions post vaccine: Marland Kitchen Difficulty breathing  . Swelling of face and throat  . A fast heartbeat  . A bad rash all over body  . Dizziness and weakness   Immunizations Administered    Name Date Dose VIS Date Route   Pfizer COVID-19 Vaccine 06/21/2019 10:44 AM 0.3 mL 03/31/2019 Intramuscular   Manufacturer: [place]   Lot: [id number]   [place]: [id number]

## 2019-07-11 NOTE — Telephone Encounter (Signed)
Pt had annual exam on 03/10/19.

## 2019-07-14 DIAGNOSIS — H2513 Age-related nuclear cataract, bilateral: Secondary | ICD-10-CM | POA: Diagnosis not present

## 2019-10-18 DIAGNOSIS — Z86018 Personal history of other benign neoplasm: Secondary | ICD-10-CM | POA: Diagnosis not present

## 2019-10-18 DIAGNOSIS — L570 Actinic keratosis: Secondary | ICD-10-CM | POA: Diagnosis not present

## 2019-10-18 DIAGNOSIS — L578 Other skin changes due to chronic exposure to nonionizing radiation: Secondary | ICD-10-CM | POA: Diagnosis not present

## 2019-10-18 DIAGNOSIS — Z85820 Personal history of malignant melanoma of skin: Secondary | ICD-10-CM | POA: Diagnosis not present

## 2019-10-18 DIAGNOSIS — Z872 Personal history of diseases of the skin and subcutaneous tissue: Secondary | ICD-10-CM | POA: Diagnosis not present

## 2019-11-03 ENCOUNTER — Other Ambulatory Visit: Payer: Self-pay | Admitting: Family Medicine

## 2019-11-03 DIAGNOSIS — Z1231 Encounter for screening mammogram for malignant neoplasm of breast: Secondary | ICD-10-CM

## 2019-12-13 ENCOUNTER — Other Ambulatory Visit: Payer: Self-pay | Admitting: Family Medicine

## 2019-12-19 ENCOUNTER — Ambulatory Visit
Admission: RE | Admit: 2019-12-19 | Discharge: 2019-12-19 | Disposition: A | Discharge status: Home / Self Care | Payer: Medicare Other | Source: Ambulatory Visit | Attending: Family Medicine | Admitting: Family Medicine

## 2019-12-19 ENCOUNTER — Other Ambulatory Visit: Payer: Self-pay

## 2019-12-19 DIAGNOSIS — Z1231 Encounter for screening mammogram for malignant neoplasm of breast: Secondary | ICD-10-CM | POA: Diagnosis not present

## 2020-03-05 ENCOUNTER — Other Ambulatory Visit: Payer: Medicare Other

## 2020-03-12 ENCOUNTER — Encounter: Payer: Medicare Other | Admitting: Family Medicine

## 2020-03-22 ENCOUNTER — Other Ambulatory Visit: Payer: Self-pay | Admitting: Family Medicine

## 2020-03-22 ENCOUNTER — Telehealth: Payer: Self-pay | Admitting: Family Medicine

## 2020-03-22 DIAGNOSIS — E538 Deficiency of other specified B group vitamins: Secondary | ICD-10-CM

## 2020-03-22 DIAGNOSIS — R7303 Prediabetes: Secondary | ICD-10-CM

## 2020-03-22 DIAGNOSIS — M8580 Other specified disorders of bone density and structure, unspecified site: Secondary | ICD-10-CM

## 2020-03-22 DIAGNOSIS — E7800 Pure hypercholesterolemia, unspecified: Secondary | ICD-10-CM

## 2020-03-22 NOTE — Telephone Encounter (Signed)
-----   Message from Cloyd Stagers, RT sent at 03/18/2020  2:03 PM EST ----- Regarding: Lab Orders for Friday 12.10.2021 Please place lab orders for Friday 12.10.2021, office visit for physical on Friday 12.17.2021 Thank you, Dyke Maes RT(R)

## 2020-03-29 ENCOUNTER — Other Ambulatory Visit (INDEPENDENT_AMBULATORY_CARE_PROVIDER_SITE_OTHER): Payer: Medicare Other

## 2020-03-29 ENCOUNTER — Other Ambulatory Visit: Payer: Self-pay

## 2020-03-29 DIAGNOSIS — E7800 Pure hypercholesterolemia, unspecified: Secondary | ICD-10-CM

## 2020-03-29 DIAGNOSIS — R7303 Prediabetes: Secondary | ICD-10-CM

## 2020-03-29 DIAGNOSIS — M8580 Other specified disorders of bone density and structure, unspecified site: Secondary | ICD-10-CM | POA: Diagnosis not present

## 2020-03-29 DIAGNOSIS — E538 Deficiency of other specified B group vitamins: Secondary | ICD-10-CM

## 2020-03-29 LAB — COMPREHENSIVE METABOLIC PANEL
ALT: 26 U/L (ref 0–35)
AST: 23 U/L (ref 0–37)
Albumin: 4.4 g/dL (ref 3.5–5.2)
Alkaline Phosphatase: 61 U/L (ref 39–117)
BUN: 17 mg/dL (ref 6–23)
CO2: 32 mEq/L (ref 19–32)
Calcium: 9.5 mg/dL (ref 8.4–10.5)
Chloride: 101 mEq/L (ref 96–112)
Creatinine, Ser: 0.77 mg/dL (ref 0.40–1.20)
GFR: 78.26 mL/min (ref >60.00)
Glucose, Bld: 97 mg/dL (ref 70–99)
Potassium: 3.9 mEq/L (ref 3.5–5.1)
Sodium: 140 mEq/L (ref 135–145)
Total Bilirubin: 0.5 mg/dL (ref 0.2–1.2)
Total Protein: 6.9 g/dL (ref 6.0–8.3)

## 2020-03-29 LAB — LIPID PANEL
Cholesterol: 163 mg/dL (ref 0–200)
HDL: 50 mg/dL (ref >39.00)
LDL Cholesterol: 91 mg/dL (ref 0–99)
NonHDL: 113.23
Total CHOL/HDL Ratio: 3
Triglycerides: 109 mg/dL (ref 0.0–149.0)
VLDL: 21.8 mg/dL (ref 0.0–40.0)

## 2020-03-29 LAB — VITAMIN B12: Vitamin B-12: 1039 pg/mL — ABNORMAL HIGH (ref 211–911)

## 2020-03-29 LAB — VITAMIN D 25 HYDROXY (VIT D DEFICIENCY, FRACTURES): VITD: 46.67 ng/mL (ref 30.00–100.00)

## 2020-03-29 LAB — HEMOGLOBIN A1C: Hgb A1c MFr Bld: 5.7 % (ref 4.6–6.5)

## 2020-03-29 NOTE — Progress Notes (Signed)
No critical labs need to be addressed urgently. We will discuss labs in detail at upcoming office visit.   

## 2020-04-05 ENCOUNTER — Encounter: Payer: Self-pay | Admitting: Family Medicine

## 2020-04-05 ENCOUNTER — Ambulatory Visit (INDEPENDENT_AMBULATORY_CARE_PROVIDER_SITE_OTHER): Payer: Medicare Other | Admitting: Family Medicine

## 2020-04-05 ENCOUNTER — Other Ambulatory Visit: Payer: Self-pay

## 2020-04-05 VITALS — BP 122/64 | HR 81 | Temp 97.7°F | Ht 62.0 in | Wt 164.0 lb

## 2020-04-05 DIAGNOSIS — K219 Gastro-esophageal reflux disease without esophagitis: Secondary | ICD-10-CM | POA: Insufficient documentation

## 2020-04-05 DIAGNOSIS — M8580 Other specified disorders of bone density and structure, unspecified site: Secondary | ICD-10-CM

## 2020-04-05 DIAGNOSIS — E538 Deficiency of other specified B group vitamins: Secondary | ICD-10-CM

## 2020-04-05 DIAGNOSIS — E7800 Pure hypercholesterolemia, unspecified: Secondary | ICD-10-CM

## 2020-04-05 DIAGNOSIS — F324 Major depressive disorder, single episode, in partial remission: Secondary | ICD-10-CM | POA: Diagnosis not present

## 2020-04-05 DIAGNOSIS — I10 Essential (primary) hypertension: Secondary | ICD-10-CM | POA: Diagnosis not present

## 2020-04-05 DIAGNOSIS — R413 Other amnesia: Secondary | ICD-10-CM

## 2020-04-05 DIAGNOSIS — R7303 Prediabetes: Secondary | ICD-10-CM

## 2020-04-05 DIAGNOSIS — R918 Other nonspecific abnormal finding of lung field: Secondary | ICD-10-CM

## 2020-04-05 DIAGNOSIS — Z0000 Encounter for general adult medical examination without abnormal findings: Secondary | ICD-10-CM

## 2020-04-05 DIAGNOSIS — Z0001 Encounter for general adult medical examination with abnormal findings: Secondary | ICD-10-CM | POA: Diagnosis not present

## 2020-04-05 DIAGNOSIS — R911 Solitary pulmonary nodule: Secondary | ICD-10-CM

## 2020-04-05 DIAGNOSIS — L570 Actinic keratosis: Secondary | ICD-10-CM | POA: Diagnosis not present

## 2020-04-05 DIAGNOSIS — R55 Syncope and collapse: Secondary | ICD-10-CM | POA: Insufficient documentation

## 2020-04-05 MED ORDER — ATORVASTATIN CALCIUM 10 MG PO TABS: 10.0000 mg | ORAL_TABLET | Freq: Every day | ORAL | 3 refills | Status: DC

## 2020-04-05 NOTE — Assessment & Plan Note (Addendum)
Inadequate control, chronic.   Discussed increased risk of CVD events in next 10 year despitre LDL decreasing. Indication present for statin given risk > 10%.  Start atorvastatin low dose. Follow up  Labs chol in 3 months.

## 2020-04-05 NOTE — Assessment & Plan Note (Signed)
Stable, chronic.  Continue current medication.  HCTZ 25 mg daily 

## 2020-04-05 NOTE — Assessment & Plan Note (Signed)
Due for  12 month repeat Lung CT for nodule stability.  Moderate risk with history of tobacco use  ( quit > 30 years ago ) and family history of lung cancer in brothers.

## 2020-04-05 NOTE — Assessment & Plan Note (Signed)
Stable, chronic. Diet controlled. Encouraged exercise, weight loss, healthy eating habits.   

## 2020-04-05 NOTE — Assessment & Plan Note (Signed)
Stable, chronic.  Continue current medication.    

## 2020-04-05 NOTE — Assessment & Plan Note (Signed)
New Brief MMSEW normal.  Lab show no secondary cause, but could consider thyroid testing and more in depth eval if continuing.

## 2020-04-05 NOTE — Assessment & Plan Note (Signed)
Stable, chronic.  Continue current medication.   On fluoxetine 20 mg daily.

## 2020-04-05 NOTE — Patient Instructions (Addendum)
Keep working on regular exercise. Start low dose lipitor daily.  return in 3-6 months for cholesterol recheck on Lipitor.  Plan Bone density with mammogram next year.  We will call to set up CT chest to re-eval lung nodule.

## 2020-04-05 NOTE — Progress Notes (Addendum)
Patient ID: Gina Harper, female    DOB: 04-22-1950, 69 y.o.   MRN: [id number]  This visit was conducted in person.  BP 122/64 (BP Location: Left Arm, Patient Position: Sitting, Cuff Size: Normal)   Pulse 81   Temp 97.7 F (36.5 C) (Temporal)   Ht 5\' 2"  (1.575 m)   Wt 164 lb (74.4 kg)   SpO2 98%   BMI 30.00 kg/m    CC: Annual medicare wellness, review of multiple chronic health issues and new issue memory loss Subjective:   HPI: Gina Harper is a 69 y.o. female presenting on 04/05/2020 for Medicare Wellness  The patient presents for annual medicare wellness, complete physical and review of chronic health problems. He/She also has the following acute concerns today:  She has noted worsening memory in last year, gradual. Husband has noted as well.  Forgets what was told a few minutes earlier. Denies depression, stress. No new meds.  Does have hearing issues.  She does not sleep well.  No family history of memory issues.   I have personally reviewed the Medicare Annual Wellness questionnaire and have noted 1. The patient's medical and social history 2. Their use of alcohol, tobacco or illicit drugs 3. Their current medications and supplements 4. The patient's functional ability including ADL's, fall risks, home safety risks and hearing or visual             impairment. 5. Diet and physical activities 6. Evidence for depression or mood disorders 7.         Updated provider list Cognitive evaluation was performed and recorded on pt medicare questionnaire form. The patients weight, height, BMI and visual acuity have been recorded in the chart  I have made referrals, counseling and provided education to the patient based review of the above and I have provided the pt with a written personalized care plan for preventive services.   Documentation of this information was scanned into the electronic record under the media tab.    Advance directives and end of life planning  reviewed in detail with patient and documented in EMR. Patient given handout on advance care directives if needed. HCPOA and living will updated if needed.   PHQ9 SCORE ONLY 03/07/2019 01/28/2018 01/26/2017  PHQ-9 Total Score 0 0 0    Fall Risk  04/05/2020 03/07/2019 01/28/2018 01/26/2017 01/14/2016  Falls in the past year? 0 0 Yes Yes Yes  Number falls in past yr: 0 0 1 2 or more 2 or more  Injury with Fall? - 0 No No Yes  Risk for fall due to : - Medication side effect - - -  Follow up Falls evaluation completed Falls evaluation completed;Falls prevention discussed - - -    Hearing Screening   125Hz  250Hz  500Hz  1000Hz  2000Hz  3000Hz  4000Hz  6000Hz  8000Hz   Right ear:  40 25 40 20  0    Left ear:  40 25 40 40  0       MDD: stable control on  Fluoxetine 20 mg daily.  Elevated Cholesterol:  Numbers improved but CVD risk > 10% Using medications without problems: Muscle aches:  Diet compliance: eating 2 times daily Exercise: gym 3 days a week. Other complaints: The 10-year ASCVD risk score Mikey Bussing DC Brooke Bonito., et al., 2013) is: 11%   Values used to calculate the score:     Age: 46 years     Sex: Female     Is Non-Hispanic African American: No  Diabetic: No     Tobacco smoker: No     Systolic Blood Pressure: 944 mmHg     Is BP treated: Yes     HDL Cholesterol: 50 mg/dL     Total Cholesterol: 163 mg/dL  Hypertension:  At goal on HCTZ 25 mg daily   Using medication without problems or lightheadedness:  none Chest pain with exertion:none Edema:none Short of breath:none Average home BPs: Other issues: prediabetes  stable control.  B12 def: resolved.      Relevant past medical, surgical, family and social history reviewed and updated as indicated. Interim medical history since our last visit reviewed. Allergies and medications reviewed and updated. Outpatient Medications Prior to Visit  Medication Sig Dispense Refill  . albuterol (PROAIR HFA) 108 (90 Base) MCG/ACT inhaler  Inhale 2 puffs into the lungs every 6 (six) hours as needed. Dispense:  3 inhalers 18 g 1  . Biotin 10000 MCG TABS Take 1 tablet by mouth daily.    . cetirizine (ZYRTEC) 10 MG tablet Take 10 mg by mouth as needed.     . Cholecalciferol (VITAMIN D3) 2000 UNITS TABS Take by mouth daily.     Marland Kitchen docusate sodium (COLACE) 100 MG capsule Take 100 mg by mouth daily.    Marland Kitchen estradiol (ESTRACE VAGINAL) 0.1 MG/GM vaginal cream Place 1 Applicatorful vaginally daily as needed. 42.5 g 11  . FLUoxetine (PROZAC) 20 MG capsule TAKE 1 CAPSULE BY MOUTH  DAILY 90 capsule 0  . hydrochlorothiazide (HYDRODIURIL) 25 MG tablet TAKE 1 TABLET BY MOUTH  DAILY 90 tablet 0  . Multiple Vitamin (MULTI-VITAMINS) TABS Take by mouth daily.    . Potassium Gluconate 550 MG TABS Take by mouth daily.     . promethazine (PHENERGAN) 25 MG suppository Place 1 suppository (25 mg total) rectally every 6 (six) hours as needed. 12 each 1  . vitamin B-12 (CYANOCOBALAMIN) 1000 MCG tablet Take 1,000 mcg by mouth daily.     No facility-administered medications prior to visit.     Per HPI unless specifically indicated in ROS section below Review of Systems  Constitutional: Negative for fatigue and fever.  HENT: Negative for ear pain.   Eyes: Negative for pain.  Respiratory: Negative for chest tightness and shortness of breath.   Cardiovascular: Negative for chest pain, palpitations and leg swelling.  Gastrointestinal: Negative for abdominal pain.  Genitourinary: Negative for dysuria.  Neurological: Negative for dizziness, facial asymmetry, light-headedness, numbness and headaches.  Psychiatric/Behavioral: Negative for dysphoric mood and self-injury. The patient is not nervous/anxious.    Objective:  BP 122/64 (BP Location: Left Arm, Patient Position: Sitting, Cuff Size: Normal)   Pulse 81   Temp 97.7 F (36.5 C) (Temporal)   Ht 5\' 2"  (1.575 m)   Wt 164 lb (74.4 kg)   SpO2 98%   BMI 30.00 kg/m   Wt Readings from Last 3 Encounters:   04/05/20 164 lb (74.4 kg)  03/10/19 170 lb 8 oz (77.3 kg)  02/25/19 166 lb (75.3 kg)      Physical Exam Constitutional:      General: She is not in acute distress.Vital signs are normal.     Appearance: Normal appearance. She is well-developed and well-nourished. She is not ill-appearing or toxic-appearing.  HENT:     Head: Normocephalic.     Right Ear: Hearing, tympanic membrane, ear canal and external ear normal. Tympanic membrane is not erythematous, retracted or bulging.     Left Ear: Hearing, tympanic membrane, ear canal and external  ear normal. Tympanic membrane is not erythematous, retracted or bulging.     Nose: Nose normal. No mucosal edema or rhinorrhea.     Right Sinus: No maxillary sinus tenderness or frontal sinus tenderness.     Left Sinus: No maxillary sinus tenderness or frontal sinus tenderness.     Mouth/Throat:     Mouth: Oropharynx is clear and moist and mucous membranes are normal.     Pharynx: Uvula midline.  Eyes:     General: Lids are normal. Lids are everted, no foreign bodies appreciated.     Extraocular Movements: EOM normal.     Conjunctiva/sclera: Conjunctivae normal.     Pupils: Pupils are equal, round, and reactive to light.  Neck:     Thyroid: No thyroid mass or thyromegaly.     Vascular: No carotid bruit.     Trachea: Trachea normal.  Cardiovascular:     Rate and Rhythm: Normal rate and regular rhythm.     Pulses: Normal pulses and intact distal pulses.     Heart sounds: Normal heart sounds, S1 normal and S2 normal. No murmur heard. No friction rub. No gallop.   Pulmonary:     Effort: Pulmonary effort is normal. No tachypnea or respiratory distress.     Breath sounds: Normal breath sounds. No decreased breath sounds, wheezing, rhonchi or rales.  Abdominal:     General: Bowel sounds are normal. There is no distension or abdominal bruit.     Palpations: Abdomen is soft. There is no fluid wave, hepatosplenomegaly or mass.     Tenderness: There is  no abdominal tenderness. There is no CVA tenderness, guarding or rebound.     Hernia: No hernia is present.  Musculoskeletal:     Cervical back: Normal range of motion and neck supple.  Lymphadenopathy:     Cervical: No cervical adenopathy.     Upper Body:  No axillary adenopathy present. Skin:    General: Skin is warm, dry and intact.     Findings: No rash.  Neurological:     Mental Status: She is alert and oriented to person, place, and time.     GCS: GCS eye subscore is 4. GCS verbal subscore is 5. GCS motor subscore is 6.     Cranial Nerves: No cranial nerve deficit.     Sensory: No sensory deficit.     Motor: No abnormal muscle tone.     Coordination: She displays a negative Romberg sign. Coordination normal.     Gait: Gait normal.     Deep Tendon Reflexes: Strength normal and reflexes are normal and symmetric.     Comments: Nml cerebellar exam   No papilledema  Psychiatric:        Mood and Affect: Mood and affect normal. Mood is not anxious or depressed.        Speech: Speech normal.        Behavior: Behavior normal. Behavior is cooperative.        Thought Content: Thought content normal.        Cognition and Memory: Cognition and memory normal. Memory is not impaired. She does not exhibit impaired recent memory or impaired remote memory.        Judgment: Judgment normal.       Results for orders placed or performed in visit on 03/29/20  VITAMIN D 25 Hydroxy (Vit-D Deficiency, Fractures)  Result Value Ref Range   VITD 46.67 30.00 - 100.00 ng/mL  Vitamin B12  Result Value Ref Range  Vitamin B-12 1,039 (H) 211 - 911 pg/mL  Comprehensive metabolic panel  Result Value Ref Range   Sodium 140 135 - 145 mEq/L   Potassium 3.9 3.5 - 5.1 mEq/L   Chloride 101 96 - 112 mEq/L   CO2 32 19 - 32 mEq/L   Glucose, Bld 97 70 - 99 mg/dL   BUN 17 6 - 23 mg/dL   Creatinine, Ser 0.77 0.40 - 1.20 mg/dL   Total Bilirubin 0.5 0.2 - 1.2 mg/dL   Alkaline Phosphatase 61 39 - 117 U/L    AST 23 0 - 37 U/L   ALT 26 0 - 35 U/L   Total Protein 6.9 6.0 - 8.3 g/dL   Albumin 4.4 3.5 - 5.2 g/dL   GFR 78.26 >60.00 mL/min   Calcium 9.5 8.4 - 10.5 mg/dL  Lipid panel  Result Value Ref Range   Cholesterol 163 0 - 200 mg/dL   Triglycerides 109.0 0.0 - 149.0 mg/dL   HDL 50.00 >39.00 mg/dL   VLDL 21.8 0.0 - 40.0 mg/dL   LDL Cholesterol 91 0 - 99 mg/dL   Total CHOL/HDL Ratio 3    NonHDL 113.23   Hemoglobin A1c  Result Value Ref Range   Hgb A1c MFr Bld 5.7 4.6 - 6.5 %    This visit occurred during the SARS-CoV-2 public health emergency.  Safety protocols were in place, including screening questions prior to the visit, additional usage of staff PPE, and extensive cleaning of exam room while observing appropriate contact time as indicated for disinfecting solutions.   COVID 19 screen:  No recent travel or known exposure to COVID19 The patient denies respiratory symptoms of COVID 19 at this time. The importance of social distancing was discussed today.   Assessment and Plan   The patient's preventative maintenance and recommended screening tests for an annual wellness exam were reviewed in full today. Brought up to date unless services declined.  Counselled on the importance of diet, exercise, and its role in overall health and mortality. The patient's FH and SH was reviewed, including their home life, tobacco status, and drug and alcohol status.   PAP not indicated given partial hysterectomy,no family history of ovarian cancer. Mammogram 11/2019 nml Colonoscopy 05/20/2015 polyp repeat in 5 years Flu vaccine,pneumonia uptodate, COVID vaccine x 2,  rx given for shingrix vaccine Hep c neg DEXA: 2017 Osteopenia, repeat due this year.. Will plan in 2020 with mammogram.  Problem List Items Addressed This Visit    Abnormal findings on diagnostic imaging of lung   Relevant Orders   CT Chest Wo Contrast   B12 deficiency (Chronic)    Stable, chronic.  Continue current  medication.         Essential hypertension, benign (Chronic)    Stable, chronic.  Continue current medication.   HCTZ 25 mg daily      Relevant Medications   atorvastatin (LIPITOR) 10 MG tablet   Lung nodule (Chronic)    Due for  12 month repeat Lung CT for nodule stability.  Moderate risk with history of tobacco use  ( quit > 30 years ago ) and family history of lung cancer in brothers.      Major depressive disorder, single episode (Chronic)    Stable, chronic.  Continue current medication.   On fluoxetine 20 mg daily.      Memory loss    New Brief MMSEW normal.  Lab show no secondary cause, but could consider thyroid testing and more in depth eval if  continuing.       Osteopenia (Chronic)   Relevant Orders   DG Bone Density   Prediabetes (Chronic)    Stable, chronic  Diet controlled  Encouraged exercise, weight loss, healthy eating habits.         Pure hypercholesterolemia (Chronic)    Inadequate control, chronic.   Discussed increased risk of CVD events in next 10 year despitre LDL decreasing. Indication present for statin given risk > 10%.  Start atorvastatin low dose. Follow up  Labs chol in 3 months.         Relevant Medications   atorvastatin (LIPITOR) 10 MG tablet    Other Visit Diagnoses    Medicare annual wellness visit, subsequent    -  Primary      Meds ordered this encounter  Medications  . atorvastatin (LIPITOR) 10 MG tablet    Sig: Take 1 tablet (10 mg total) by mouth daily.    Dispense:  90 tablet    Refill:  3    Orders Placed This Encounter  Procedures  . DG Bone Density  . CT Chest Wo Contrast    Eliezer Lofts, MD

## 2020-04-07 DIAGNOSIS — H9193 Unspecified hearing loss, bilateral: Secondary | ICD-10-CM

## 2020-04-23 ENCOUNTER — Ambulatory Visit
Admission: RE | Admit: 2020-04-23 | Discharge: 2020-04-23 | Disposition: A | Discharge status: Home / Self Care | Payer: Medicare Other | Source: Ambulatory Visit | Attending: Family Medicine | Admitting: Family Medicine

## 2020-04-23 ENCOUNTER — Other Ambulatory Visit: Payer: Self-pay

## 2020-04-23 DIAGNOSIS — Z720 Tobacco use: Secondary | ICD-10-CM | POA: Diagnosis not present

## 2020-04-23 DIAGNOSIS — R911 Solitary pulmonary nodule: Secondary | ICD-10-CM | POA: Diagnosis not present

## 2020-04-23 DIAGNOSIS — R918 Other nonspecific abnormal finding of lung field: Secondary | ICD-10-CM | POA: Diagnosis not present

## 2020-04-23 DIAGNOSIS — I700 Atherosclerosis of aorta: Secondary | ICD-10-CM | POA: Diagnosis not present

## 2020-04-24 DIAGNOSIS — E041 Nontoxic single thyroid nodule: Secondary | ICD-10-CM

## 2020-05-09 ENCOUNTER — Ambulatory Visit
Admission: RE | Admit: 2020-05-09 | Discharge: 2020-05-09 | Disposition: A | Discharge status: Home / Self Care | Payer: Medicare Other | Source: Ambulatory Visit | Attending: Family Medicine | Admitting: Family Medicine

## 2020-05-09 ENCOUNTER — Other Ambulatory Visit: Payer: Self-pay

## 2020-05-09 DIAGNOSIS — H903 Sensorineural hearing loss, bilateral: Secondary | ICD-10-CM | POA: Diagnosis not present

## 2020-05-09 DIAGNOSIS — E042 Nontoxic multinodular goiter: Secondary | ICD-10-CM | POA: Diagnosis not present

## 2020-05-09 DIAGNOSIS — E041 Nontoxic single thyroid nodule: Secondary | ICD-10-CM | POA: Diagnosis not present

## 2020-05-27 DIAGNOSIS — H905 Unspecified sensorineural hearing loss: Secondary | ICD-10-CM | POA: Diagnosis not present

## 2020-06-13 DIAGNOSIS — K219 Gastro-esophageal reflux disease without esophagitis: Secondary | ICD-10-CM | POA: Diagnosis not present

## 2020-06-13 DIAGNOSIS — Z86010 Personal history of colonic polyps: Secondary | ICD-10-CM | POA: Diagnosis not present

## 2020-06-17 ENCOUNTER — Other Ambulatory Visit: Payer: Self-pay | Admitting: Family Medicine

## 2020-07-11 ENCOUNTER — Telehealth: Payer: Self-pay | Admitting: Family Medicine

## 2020-07-11 DIAGNOSIS — E7800 Pure hypercholesterolemia, unspecified: Secondary | ICD-10-CM

## 2020-07-11 NOTE — Telephone Encounter (Signed)
-----   Message from Cloyd Stagers, RT sent at 07/01/2020  9:56 AM EDT ----- Regarding: Lab Orders for Friday 4.1.2022 Please place lab orders for Friday 4.1.2022, appt notes state "fasting labs" Thank you, Dyke Maes RT(R)

## 2020-07-15 DIAGNOSIS — H2513 Age-related nuclear cataract, bilateral: Secondary | ICD-10-CM | POA: Diagnosis not present

## 2020-07-19 ENCOUNTER — Other Ambulatory Visit: Payer: Self-pay

## 2020-07-19 ENCOUNTER — Other Ambulatory Visit (INDEPENDENT_AMBULATORY_CARE_PROVIDER_SITE_OTHER): Payer: Medicare Other

## 2020-07-19 DIAGNOSIS — E7800 Pure hypercholesterolemia, unspecified: Secondary | ICD-10-CM

## 2020-07-19 LAB — COMPREHENSIVE METABOLIC PANEL
ALT: 21 U/L (ref 0–35)
AST: 20 U/L (ref 0–37)
Albumin: 4.5 g/dL (ref 3.5–5.2)
Alkaline Phosphatase: 66 U/L (ref 39–117)
BUN: 21 mg/dL (ref 6–23)
CO2: 32 mEq/L (ref 19–32)
Calcium: 9.4 mg/dL (ref 8.4–10.5)
Chloride: 103 mEq/L (ref 96–112)
Creatinine, Ser: 0.78 mg/dL (ref 0.40–1.20)
GFR: 76.89 mL/min (ref >60.00)
Glucose, Bld: 105 mg/dL — ABNORMAL HIGH (ref 70–99)
Potassium: 3.6 mEq/L (ref 3.5–5.1)
Sodium: 142 mEq/L (ref 135–145)
Total Bilirubin: 0.4 mg/dL (ref 0.2–1.2)
Total Protein: 7.3 g/dL (ref 6.0–8.3)

## 2020-07-19 LAB — LIPID PANEL
Cholesterol: 131 mg/dL (ref 0–200)
HDL: 59.4 mg/dL (ref >39.00)
LDL Cholesterol: 57 mg/dL (ref 0–99)
NonHDL: 71.46
Total CHOL/HDL Ratio: 2
Triglycerides: 71 mg/dL (ref 0.0–149.0)
VLDL: 14.2 mg/dL (ref 0.0–40.0)

## 2020-09-17 ENCOUNTER — Other Ambulatory Visit: Payer: Self-pay

## 2020-09-17 ENCOUNTER — Ambulatory Visit
Admission: EM | Admit: 2020-09-17 | Discharge: 2020-09-17 | Disposition: A | Discharge status: Home / Self Care | Payer: Medicare Other | Attending: Emergency Medicine | Admitting: Emergency Medicine

## 2020-09-17 DIAGNOSIS — J069 Acute upper respiratory infection, unspecified: Secondary | ICD-10-CM

## 2020-09-17 DIAGNOSIS — R059 Cough, unspecified: Secondary | ICD-10-CM

## 2020-09-17 MED ORDER — BENZONATATE 100 MG PO CAPS: 100.0000 mg | ORAL_CAPSULE | Freq: Three times a day (TID) | ORAL | 0 refills | Status: DC | PRN

## 2020-09-17 NOTE — Discharge Instructions (Signed)
Take the Tessalon as needed for cough.    Your COVID test is pending.  You should self quarantine until the test result is back.    Follow up with your primary care provider if your symptoms are not improving.

## 2020-09-17 NOTE — ED Provider Notes (Signed)
Roderic Palau    CSN: [id number] Arrival date & time: 09/17/20  4650      History   Chief Complaint Chief Complaint  Patient presents with  . Cough    HPI Gina Harper is a 70 y.o. female.   Patient presents with "low-grade fever", congestion, sneezing, cough x8 days.  T-max 98.  She states all of her symptoms have improved other than her cough which is occasionally productive of green sputum.  She denies shortness of breath, wheezing, vomiting, diarrhea, or other symptoms.  Treatment at home with Flonase, Tylenol, guaifenesin.  He reports 3 negative at-home COVID tests.  Her medical history includes hypertension, asthma, former smoker, GERD, anemia, depression, arthritis.  The history is provided by the patient and medical records.    Past Medical History:  Diagnosis Date  . 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 Allergic rhinitis, cause unspecified   . Allergy   . Alopecia, unspecified   . Anemia    pernicious anemia  . Arthritis    hands, knees, hips  . Cancer ([place])    melanoma  . Chicken pox   . Depressive disorder, not elsewhere classified   . Disorder of bone and cartilage, unspecified   . Esophageal reflux   . GERD (gastroesophageal reflux disease)   . Hepatitis   . History of shingles   . Insomnia, unspecified   . Measles    red measles  . Motion sickness   . Mumps   . Other abnormal glucose   . Palpitations   . Syncope, vasovagal   . Tobacco use disorder   . Unspecified asthma(493.90)    exercise induced. Never hospitalized  . Unspecified essential hypertension   . Unspecified hearing loss   . Unspecified pruritic disorder     Patient Active Problem List   Diagnosis Date Noted  . GERD (gastroesophageal reflux disease) 04/05/2020  . Syncope 04/05/2020  . Memory loss 04/05/2020  . Abnormal findings on diagnostic imaging of lung 04/05/2020  . Lung nodule 03/10/2019  . Aortic atherosclerosis ([place])  03/10/2019  . Pre-op examination 02/25/2019  . Right lower quadrant pain 07/22/2018  . Pleuritic chest pain 01/28/2018  . Chronic insomnia 08/06/2016  . Osteopenia 01/14/2016  . Abnormal mammogram of right breast 12/08/2015  . Venous insufficiency, peripheral 11/18/2015  . B12 deficiency 11/18/2015  . Seasonal allergies 11/18/2015  . Chronic dermatitis of hands 01/31/2014  . Peripheral neuropathy 01/31/2014  . Prediabetes 11/08/2013  . Routine general medical examination at a health care facility 08/15/2012  . Major depressive disorder, single episode 04/05/2012  . Pure hypercholesterolemia 04/05/2012  . Atrophic vaginitis 04/05/2012  . Elevated CEA 04/05/2012  . Exercise-induced asthma 04/05/2012  . Essential hypertension, benign 04/05/2012    Past Surgical History:  Procedure Laterality Date  . bladder tact  1995  . BROW LIFT Bilateral 07/09/2015   Procedure: BLEPHAROPLASTY;  Surgeon: Karle Starch, MD;  Location: [facility];  Service: Ophthalmology;  Laterality: Bilateral;  . BUNIONECTOMY  [id number]   bilateral  . colonoscopy  04/20/2004   no polyps; normal.  Repeat in 10 years.  [place].  Marland Kitchen COLONOSCOPY WITH PROPOFOL N/A 05/20/2015   Procedure: COLONOSCOPY WITH PROPOFOL;  Surgeon: Lollie Sails, MD;  Location[facility] ENDOSCOPY;  Service: Endoscopy;  Laterality: N/A;  . COSMETIC SURGERY     rhinoplasty  . ESOPHAGOGASTRODUODENOSCOPY  03/30/2005  . PTOSIS REPAIR Bilateral 07/09/2015   Procedure: PTOSIS REPAIR;  Surgeon:  Amy Dennie Maizes, MD;  Location: [place];  Service: Ophthalmology;  Laterality: Bilateral;  . rhinoplasty with septoplasty    . TONSILLECTOMY AND ADENOIDECTOMY    . TUBAL LIGATION    . VAGINAL HYSTERECTOMY  1997   menorrhagia ovaries intact    OB History   No obstetric history on file.      Home Medications    Prior to Admission medications   Medication Sig Start Date End Date Taking? Authorizing Provider  benzonatate (TESSALON) 100  MG capsule Take 1 capsule (100 mg total) by mouth 3 (three) times daily as needed for cough. 09/17/20  Yes Sharion Balloon, NP  albuterol (PROAIR HFA) 108 (90 Base) MCG/ACT inhaler Inhale 2 puffs into the lungs every 6 (six) hours as needed. Dispense:  3 inhalers 01/28/18   Bedsole, Amy E, MD  atorvastatin (LIPITOR) 10 MG tablet Take 1 tablet (10 mg total) by mouth daily. 04/05/20   Bedsole, Amy E, MD  Biotin 10000 MCG TABS Take 1 tablet by mouth daily.    [provider]  cetirizine (ZYRTEC) 10 MG tablet Take 10 mg by mouth as needed.     [provider]  Cholecalciferol (VITAMIN D3) 2000 UNITS TABS Take by mouth daily.     [provider]  docusate sodium (COLACE) 100 MG capsule Take 100 mg by mouth daily.    [provider]  estradiol (ESTRACE VAGINAL) 0.1 MG/GM vaginal cream Place 1 Applicatorful vaginally daily as needed. 01/28/18   Bedsole, Amy E, MD  FLUoxetine (PROZAC) 20 MG capsule TAKE 1 CAPSULE BY MOUTH  DAILY 06/17/20   Bedsole, Amy E, MD  hydrochlorothiazide (HYDRODIURIL) 25 MG tablet TAKE 1 TABLET BY MOUTH  DAILY 06/17/20   Diona Browner, Amy E, MD  Multiple Vitamin (MULTI-VITAMINS) TABS Take by mouth daily.    [provider]  Potassium Gluconate 550 MG TABS Take by mouth daily.     [provider]  promethazine (PHENERGAN) 25 MG suppository Place 1 suppository (25 mg total) rectally every 6 (six) hours as needed. 01/28/18   Bedsole, Amy E, MD  vitamin B-12 (CYANOCOBALAMIN) 1000 MCG tablet Take 1,000 mcg by mouth daily.    [provider]    Family History Family History  Problem Relation Age of Onset  . Cancer Mother        Lung, Bladder  . Stroke Father 72       TIAs  . Heart disease Father 34       AMI x 2.  . Colon polyps Father   . Thyroid disease Sister   . Cancer Brother        lung  . Breast cancer Maternal Aunt 60  . Breast cancer Maternal Aunt     Social History Social History   Tobacco Use  . Smoking  status: Former Smoker    Packs/day: 1.00    Years: 17.00    Pack years: 17.00    Types: Cigarettes    Quit date: 05/22/1988    Years since quitting: 32.3  . Smokeless tobacco: Never Used  . Tobacco comment: Quit 22 years ago, 1990  Substance Use Topics  . Alcohol use: Yes    Alcohol/week: 6.0 standard drinks    Types: 6 Standard drinks or equivalent per week    Comment: moderate white wine 5 glasses per week  . Drug use: No     Allergies   Cefdinir, Lisinopril, Meloxicam, Omnipen [ampicillin], Augmentin [amoxicillin-pot clavulanate], and Clarithromycin  Review of Systems Review of Systems  Constitutional: Positive for fever. Negative for chills.  HENT: Positive for congestion and sneezing. Negative for ear pain and sore throat.   Respiratory: Positive for cough. Negative for shortness of breath.   Cardiovascular: Negative for chest pain and palpitations.  Gastrointestinal: Negative for abdominal pain, diarrhea and vomiting.  Skin: Negative for color change and rash.  All other systems reviewed and are negative.    Physical Exam Triage Vital Signs ED Triage Vitals  Enc Vitals Group     BP      Pulse      Resp      Temp      Temp src      SpO2      Weight      Height      Head Circumference      Peak Flow      Pain Score      Pain Loc      Pain Edu?      Excl. in [place]?    No data found.  Updated Vital Signs BP 122/79   Pulse 61   Temp (!) 97.5 F (36.4 C)   Resp 19   SpO2 97%   Visual Acuity Right Eye Distance:   Left Eye Distance:   Bilateral Distance:    Right Eye Near:   Left Eye Near:    Bilateral Near:     Physical Exam Vitals and nursing note reviewed.  Constitutional:      General: She is not in acute distress.    Appearance: She is well-developed. She is not ill-appearing.  HENT:     Head: Normocephalic and atraumatic.     Right Ear: Tympanic membrane normal.     Left Ear: Tympanic membrane normal.     Nose: Nose normal.      Mouth/Throat:     Mouth: Mucous membranes are moist.     Pharynx: Oropharynx is clear.  Eyes:     Conjunctiva/sclera: Conjunctivae normal.  Cardiovascular:     Rate and Rhythm: Normal rate and regular rhythm.     Heart sounds: Normal heart sounds.  Pulmonary:     Effort: Pulmonary effort is normal. No respiratory distress.     Breath sounds: Normal breath sounds. No wheezing.  Abdominal:     Palpations: Abdomen is soft.     Tenderness: There is no abdominal tenderness.  Musculoskeletal:     Cervical back: Neck supple.  Skin:    General: Skin is warm and dry.  Neurological:     General: No focal deficit present.     Mental Status: She is alert and oriented to person, place, and time.     Gait: Gait normal.  Psychiatric:        Mood and Affect: Mood normal.        Behavior: Behavior normal.      UC Treatments / Results  Labs (all labs ordered are listed, but only abnormal results are displayed) Labs Reviewed  NOVEL CORONAVIRUS, NAA    EKG   Radiology No results found.  Procedures Procedures (including critical care time)  Medications Ordered in UC Medications - No data to display  Initial Impression / Assessment and Plan / UC Course  I have reviewed the triage vital signs and the nursing notes.  Pertinent labs & imaging results that were available during my care of the patient were reviewed by me and considered in my medical decision making (see chart for  details).   Cough, viral URI.  Patient is well-appearing, her exam is reassuring, no wheezes, lungs are clear, O2 sat 97%.  Treating cough with Tessalon.  PCR COVID pending per patient request.  Instructed her to self quarantine until the test result is back.  Instructed her to follow-up with her PCP if her symptoms are not improving.  She agrees to plan of care.   Final Clinical Impressions(s) / UC Diagnoses   Final diagnoses:  Cough  Viral URI     Discharge Instructions     Take the Tessalon as  needed for cough.    Your COVID test is pending.  You should self quarantine until the test result is back.    Follow up with your primary care provider if your symptoms are not improving.        ED Prescriptions    Medication Sig Dispense Auth. Provider   benzonatate (TESSALON) 100 MG capsule Take 1 capsule (100 mg total) by mouth 3 (three) times daily as needed for cough. 21 capsule Sharion Balloon, NP     PDMP not reviewed this encounter.   Sharion Balloon, NP 09/17/20 1051

## 2020-09-17 NOTE — ED Triage Notes (Signed)
Pt presents with complaints of coughing and sneezing x 8 days. Reports low grade fever. Complaints everything is starting to feel better other than her cough. Reports in general not feeling well. reports she took 2 at home covid tests that were negative.

## 2020-09-18 LAB — SARS-COV-2, NAA 2 DAY TAT

## 2020-09-18 LAB — NOVEL CORONAVIRUS, NAA: SARS-CoV-2, NAA: NOT DETECTED

## 2020-09-20 ENCOUNTER — Other Ambulatory Visit: Payer: Self-pay | Admitting: Family Medicine

## 2020-09-20 ENCOUNTER — Telehealth: Payer: Self-pay

## 2020-09-20 MED ORDER — AZITHROMYCIN 250 MG PO TABS: ORAL_TABLET | ORAL | 0 refills | Status: DC

## 2020-09-20 NOTE — Telephone Encounter (Signed)
Pt left v/m since around 09/08/20 pt had some prod cough with green phlegm,occasional chills and low grade fever,sinus cavities feel full, and has sweats at times.pt was seen at [facility] on 09/17/20 and was given benzonatate which helps cough some but pt is supposed to have a colonoscopy on 09/25/20 and is going on a 10 day road trip in a car with her husband on 09/30/20 and pt has seen some improvement but is not a lot better than when seen at [facility].  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 Pt request cb after reviewed by Dr Diona Browner to see if abx is going to be called in. Pt went ahead and scheduled a video visit with DR Diona Browner on 09/24/20 if an abx cannot be sent in. Sending note to Dr Diona Browner and donna CMA.

## 2020-09-20 NOTE — Telephone Encounter (Signed)
Patient called and informed me that she would like the azithromycin and will keep her appointment for Tuesday to get ahead of it and to discuss any other possibilities.

## 2020-09-20 NOTE — Telephone Encounter (Signed)
She stated that she tolerated in the past and wanted to start it before seeing you on Tuesday.

## 2020-09-20 NOTE — Telephone Encounter (Signed)
Did she say if she has taken and tolerated azithromycin in past?... she had rash with clarithromycin which is in same family. Just want to verify.

## 2020-09-20 NOTE — Telephone Encounter (Signed)
See below.Please call pt and find out if she has tolerated azithromycin.Marland Kitchen Zpack in past. Hass allergy to many antibiotics, so choices are limited.

## 2020-09-20 NOTE — Telephone Encounter (Signed)
Reviewed 09/17/2020 OV note from urgent care.. lungs clear, neg COVID PCR test  Given seen already at urgent care... continuing symptoms. I will send in rx for antibiotics, but if not improving keep 6/7 appt.

## 2020-09-24 ENCOUNTER — Encounter: Payer: Self-pay | Admitting: Family Medicine

## 2020-09-24 ENCOUNTER — Encounter: Payer: Self-pay | Admitting: Internal Medicine

## 2020-09-24 ENCOUNTER — Telehealth (INDEPENDENT_AMBULATORY_CARE_PROVIDER_SITE_OTHER): Payer: Medicare Other | Admitting: Family Medicine

## 2020-09-24 ENCOUNTER — Other Ambulatory Visit: Payer: Self-pay

## 2020-09-24 VITALS — BP 140/85 | HR 66 | Temp 97.1°F | Ht 62.0 in | Wt 158.0 lb

## 2020-09-24 DIAGNOSIS — J209 Acute bronchitis, unspecified: Secondary | ICD-10-CM | POA: Diagnosis not present

## 2020-09-24 NOTE — Assessment & Plan Note (Signed)
Improving s/p Zpack.  Neg COVID PCR test. No current asthma flare  Or indication for prednisone.  Okay to proceed with colonoscopy.  Will not treat with sedating cough suppressant given colonoscopy tomorrow.

## 2020-09-24 NOTE — Progress Notes (Signed)
VIRTUAL VISIT Due to national recommendations of social distancing due to [place] 19, a virtual visit is felt to be most appropriate for this patient at this time.   I connected with the patient on 09/24/20 at 11:00 AM EDT by virtual telehealth platform and verified that I am speaking with the correct person using two identifiers.   I discussed the limitations, risks, security and privacy concerns of performing an evaluation and management service by  virtual telehealth platform and the availability of in person appointments. I also discussed with the patient that there may be a patient responsible charge related to this service. The patient expressed understanding and agreed to proceed.  Patient location: Home Provider Location: [facility] Participants: Gina Harper and [place]   Chief Complaint  Patient presents with  . Cough    Productive-Negative Covid Home test and negative test at [facility] Scheduled for Colonoscopy in morning-wants to make sure it is okay to do Going 2 week road on Monday and wants to get cleared up  . Fever    Low Grade    History of Present Illness:  70 year old female with history of asthma presents with continued cough.  She was seen at urgent Care on 5/31. Patient presents with "low-grade fever", congestion, sneezing, cough x8 days.  T-max 98.  She states all of her symptoms have improved other than her cough which is occasionally productive of green sputum.  She denies shortness of breath, wheezing, vomiting, diarrhea, or other symptoms.  Treatment at home with Flonase, Tylenol, guaifenesin.  He reports 3 negative at-home COVID tests.    She called with continue symptoms... azithromycin 5 days was started on 6/3 for possible bacterial superinfection.   Today she reports continued cough. Still having drip down back of throat. Mainly ongoing when lying down at night.  She does feel better overall, about 70-80% better. No further fever.  Mucus  was greenish and it is less and more whitish.  No wheeze, no SOB.  no chest pain, no ST, no ear pain, no sinus pain.   She has colonoscopy schedule for 09/25/2020    COVID 19 screen COVID testing:mulitple home tests negative, 5/31 PCR test negative COVID vaccine: 06/21/2019 , 05/26/2019 COVID exposure: No recent travel or known exposure to [place]  The importance of social distancing was discussed today.    Review of Systems  Constitutional: Negative for chills and fever.  HENT: Negative for congestion and ear pain.   Eyes: Negative for pain and redness.  Respiratory: Negative for cough and shortness of breath.   Cardiovascular: Negative for chest pain, palpitations and leg swelling.  Gastrointestinal: Negative for abdominal pain, blood in stool, constipation, diarrhea, nausea and vomiting.  Genitourinary: Negative for dysuria.  Musculoskeletal: Negative for falls and myalgias.  Skin: Negative for rash.  Neurological: Negative for dizziness.  Psychiatric/Behavioral: Negative for depression. The patient is not nervous/anxious.       Past Medical History:  Diagnosis Date  . 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 Allergic rhinitis, cause unspecified   . Allergy   . Alopecia, unspecified   . Anemia    pernicious anemia  . Arthritis    hands, knees, hips  . Cancer ([place])    melanoma  . Chicken pox   . Depressive disorder, not elsewhere classified   . Disorder of bone and cartilage, unspecified   . Esophageal reflux   . GERD (gastroesophageal reflux disease)   .  Hepatitis   . History of shingles   . Insomnia, unspecified   . Measles    red measles  . Motion sickness   . Mumps   . Other abnormal glucose   . Palpitations   . Syncope, vasovagal   . Tobacco use disorder   . Unspecified asthma(493.90)    exercise induced. Never hospitalized  . Unspecified essential hypertension   . Unspecified hearing loss   . Unspecified pruritic  disorder     reports that she quit smoking about 32 years ago. Her smoking use included cigarettes. She has a 17.00 pack-year smoking history. She has never used smokeless tobacco. She reports current alcohol use of about 6.0 standard drinks of alcohol per week. She reports that she does not use drugs.   Current Outpatient Medications:  .  albuterol (PROAIR HFA) 108 (90 Base) MCG/ACT inhaler, Inhale 2 puffs into the lungs every 6 (six) hours as needed. Dispense:  3 inhalers, Disp: 18 g, Rfl: 1 .  atorvastatin (LIPITOR) 10 MG tablet, Take 1 tablet (10 mg total) by mouth daily., Disp: 90 tablet, Rfl: 3 .  azithromycin (ZITHROMAX) 250 MG tablet, 2 tab po x 1 day then 1 tab po daily, Disp: 6 tablet, Rfl: 0 .  benzonatate (TESSALON) 100 MG capsule, Take 1 capsule (100 mg total) by mouth 3 (three) times daily as needed for cough., Disp: 21 capsule, Rfl: 0 .  Biotin 10000 MCG TABS, Take 1 tablet by mouth daily., Disp: , Rfl:  .  cetirizine (ZYRTEC) 10 MG tablet, Take 10 mg by mouth as needed. , Disp: , Rfl:  .  Cholecalciferol (VITAMIN D3) 2000 UNITS TABS, Take by mouth daily. , Disp: , Rfl:  .  docusate sodium (COLACE) 100 MG capsule, Take 100 mg by mouth daily., Disp: , Rfl:  .  estradiol (ESTRACE VAGINAL) 0.1 MG/GM vaginal cream, Place 1 Applicatorful vaginally daily as needed., Disp: 42.5 g, Rfl: 11 .  FLUoxetine (PROZAC) 20 MG capsule, TAKE 1 CAPSULE BY MOUTH  DAILY, Disp: 90 capsule, Rfl: 1 .  hydrochlorothiazide (HYDRODIURIL) 25 MG tablet, TAKE 1 TABLET BY MOUTH  DAILY, Disp: 90 tablet, Rfl: 1 .  Multiple Vitamin (MULTI-VITAMINS) TABS, Take by mouth daily., Disp: , Rfl:  .  Potassium Gluconate 550 MG TABS, Take by mouth daily. , Disp: , Rfl:  .  promethazine (PHENERGAN) 25 MG suppository, Place 1 suppository (25 mg total) rectally every 6 (six) hours as needed., Disp: 12 each, Rfl: 1 .  vitamin B-12 (CYANOCOBALAMIN) 1000 MCG tablet, Take 1,000 mcg by mouth daily., Disp: , Rfl:     Observations/Objective: Blood pressure 140/85, pulse 66, temperature (!) 97.1 F (36.2 C), temperature source Oral, height 5\' 2"  (1.575 m), weight 158 lb (71.7 kg), SpO2 96 %.  Physical Exam  Physical Exam Constitutional:      General: The patient is not in acute distress. Pulmonary:     Effort: Pulmonary effort is normal. No respiratory distress.  Neurological:     Mental Status: The patient is alert and oriented to person, place, and time.  Psychiatric:        Mood and Affect: Mood normal.        Behavior: Behavior normal.   Assessment and Plan    Problem List Items Addressed This Visit    Acute bronchitis - Primary    Improving s/p Zpack.  Neg COVID PCR test. No current asthma flare  Or indication for prednisone.  Okay to proceed with colonoscopy.  Will  not treat with sedating cough suppressant given colonoscopy tomorrow.         I discussed the assessment and treatment plan with the patient. The patient was provided an opportunity to ask questions and all were answered. The patient agreed with the plan and demonstrated an understanding of the instructions.   The patient was advised to call back or seek an in-person evaluation if the symptoms worsen or if the condition fails to improve as anticipated.     Gina Lofts, MD

## 2020-09-25 ENCOUNTER — Ambulatory Visit: Payer: Medicare Other | Admitting: Certified Registered"

## 2020-09-25 ENCOUNTER — Encounter
Admission: RE | Disposition: A | Discharge status: Home / Self Care | Payer: Self-pay | Source: Home / Self Care | Attending: Internal Medicine

## 2020-09-25 ENCOUNTER — Other Ambulatory Visit: Payer: Self-pay

## 2020-09-25 ENCOUNTER — Encounter: Payer: Self-pay | Admitting: Internal Medicine

## 2020-09-25 ENCOUNTER — Ambulatory Visit
Admission: RE | Admit: 2020-09-25 | Discharge: 2020-09-25 | Disposition: A | Discharge status: Home / Self Care | Payer: Medicare Other | Attending: Internal Medicine | Admitting: Internal Medicine

## 2020-09-25 DIAGNOSIS — K5520 Angiodysplasia of colon without hemorrhage: Secondary | ICD-10-CM | POA: Insufficient documentation

## 2020-09-25 DIAGNOSIS — Z888 Allergy status to other drugs, medicaments and biological substances status: Secondary | ICD-10-CM | POA: Insufficient documentation

## 2020-09-25 DIAGNOSIS — Z79899 Other long term (current) drug therapy: Secondary | ICD-10-CM | POA: Insufficient documentation

## 2020-09-25 DIAGNOSIS — Z7982 Long term (current) use of aspirin: Secondary | ICD-10-CM | POA: Insufficient documentation

## 2020-09-25 DIAGNOSIS — Z1211 Encounter for screening for malignant neoplasm of colon: Secondary | ICD-10-CM | POA: Diagnosis not present

## 2020-09-25 DIAGNOSIS — Z86010 Personal history of colonic polyps: Secondary | ICD-10-CM | POA: Insufficient documentation

## 2020-09-25 DIAGNOSIS — Z880 Allergy status to penicillin: Secondary | ICD-10-CM | POA: Insufficient documentation

## 2020-09-25 DIAGNOSIS — K219 Gastro-esophageal reflux disease without esophagitis: Secondary | ICD-10-CM | POA: Diagnosis not present

## 2020-09-25 DIAGNOSIS — Z881 Allergy status to other antibiotic agents status: Secondary | ICD-10-CM | POA: Diagnosis not present

## 2020-09-25 DIAGNOSIS — Q2733 Arteriovenous malformation of digestive system vessel: Secondary | ICD-10-CM | POA: Diagnosis not present

## 2020-09-25 HISTORY — DX: Unspecified asthma, uncomplicated: J45.909

## 2020-09-25 HISTORY — PX: COLONOSCOPY WITH PROPOFOL: SHX5780

## 2020-09-25 SURGERY — COLONOSCOPY WITH PROPOFOL
Anesthesia: General

## 2020-09-25 MED ORDER — SODIUM CHLORIDE 0.9 % IV SOLN: INTRAVENOUS | Status: DC

## 2020-09-25 MED ORDER — PROPOFOL 500 MG/50ML IV EMUL
INTRAVENOUS | Status: DC | PRN
  Administered 2020-09-25: 150 ug/kg/min via INTRAVENOUS

## 2020-09-25 MED ORDER — PROPOFOL 10 MG/ML IV BOLUS
INTRAVENOUS | Status: DC | PRN
  Administered 2020-09-25: 60 mg via INTRAVENOUS

## 2020-09-25 MED ORDER — LIDOCAINE HCL (CARDIAC) PF 100 MG/5ML IV SOSY
PREFILLED_SYRINGE | INTRAVENOUS | Status: DC | PRN
  Administered 2020-09-25: 60 mg via INTRAVENOUS

## 2020-09-25 MED ORDER — PROPOFOL 500 MG/50ML IV EMUL
INTRAVENOUS | Status: AC
  Filled 2020-09-25: qty 100

## 2020-09-25 NOTE — H&P (Signed)
Outpatient short stay form Pre-procedure 09/25/2020 9:50 AM Kanija Remmel K. Alice Reichert, M.D.  Primary Physician: Eliezer Lofts, M.D.  Reason for visit:  Personal history of colon polyps  History of present illness:                            Patient presents for colonoscopy for a personal hx of colon polyps. The patient denies abdominal pain, abnormal weight loss or rectal bleeding.      Current Facility-Administered Medications:  .  0.9 %  sodium chloride infusion, , Intravenous, Continuous, Bandana, Benay Pike, MD, Last Rate: 20 mL/hr at 09/25/20 2956, Continued from Pre-op at 09/25/20 2130  Medications Prior to Admission  Medication Sig Dispense Refill Last Dose  . albuterol (PROAIR HFA) 108 (90 Base) MCG/ACT inhaler Inhale 2 puffs into the lungs every 6 (six) hours as needed. Dispense:  3 inhalers 18 g 1 Past Month at Unknown time  . atorvastatin (LIPITOR) 10 MG tablet Take 1 tablet (10 mg total) by mouth daily. 90 tablet 3 09/24/2020 at Unknown time  . azithromycin (ZITHROMAX) 250 MG tablet 2 tab po x 1 day then 1 tab po daily 6 tablet 0 09/24/2020 at Unknown time  . benzonatate (TESSALON) 100 MG capsule Take 1 capsule (100 mg total) by mouth 3 (three) times daily as needed for cough. 21 capsule 0 09/24/2020 at Unknown time  . Biotin 10000 MCG TABS Take 1 tablet by mouth daily.   09/24/2020 at Unknown time  . Calcium Carb-Cholecalciferol (CALCIUM CARBONATE-VITAMIN D3 PO) Take by mouth.   09/24/2020 at Unknown time  . cetirizine (ZYRTEC) 10 MG tablet Take 10 mg by mouth as needed.    09/24/2020 at Unknown time  . Cholecalciferol (VITAMIN D3) 2000 UNITS TABS Take by mouth daily.    09/24/2020 at Unknown time  . famotidine (PEPCID) 40 MG tablet Take 40 mg by mouth daily.   Past Month at Unknown time  . FLUoxetine (PROZAC) 20 MG capsule TAKE 1 CAPSULE BY MOUTH  DAILY 90 capsule 1 09/24/2020 at Unknown time  . hydrochlorothiazide (HYDRODIURIL) 25 MG tablet TAKE 1 TABLET BY MOUTH  DAILY 90 tablet 1 09/24/2020 at Unknown  time  . Multiple Vitamin (MULTI-VITAMINS) TABS Take by mouth daily.   09/24/2020 at Unknown time  . Potassium Gluconate 550 MG TABS Take by mouth daily.    09/24/2020 at Unknown time  . Probiotic, Lactobacillus, CAPS Take by mouth.   Past Month at Unknown time  . rOPINIRole (REQUIP) 0.25 MG tablet Take 0.25 mg by mouth 3 (three) times daily.   Past Month at Unknown time  . vitamin B-12 (CYANOCOBALAMIN) 1000 MCG tablet Take 1,000 mcg by mouth daily.   09/24/2020 at Unknown time  . aspirin 81 MG chewable tablet Chew by mouth daily. (Patient not taking: Reported on 09/25/2020)   Not Taking at Unknown time  . docusate sodium (COLACE) 100 MG capsule Take 100 mg by mouth daily.     Marland Kitchen estradiol (ESTRACE VAGINAL) 0.1 MG/GM vaginal cream Place 1 Applicatorful vaginally daily as needed. 42.5 g 11   . Eszopiclone 3 MG TABS Take 3 mg by mouth at bedtime. Take immediately before bedtime (Patient not taking: Reported on 09/25/2020)   Not Taking at Unknown time  . losartan (COZAAR) 25 MG tablet Take 25 mg by mouth daily. (Patient not taking: Reported on 09/25/2020)   Not Taking at Unknown time  . omeprazole (PRILOSEC) 20 MG capsule Take 20 mg  by mouth daily. (Patient not taking: Reported on 09/25/2020)   Not Taking at Unknown time  . promethazine (PHENERGAN) 25 MG suppository Place 1 suppository (25 mg total) rectally every 6 (six) hours as needed. 12 each 1      Allergies  Allergen Reactions  . Cefdinir Hives  . Lisinopril Cough and Other (See Comments)  . Meloxicam Swelling  . Omnipen [Ampicillin] Other (See Comments)  . Augmentin [Amoxicillin-Pot Clavulanate] Rash  . Clarithromycin Rash     Past Medical History:  Diagnosis Date  . 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 Allergic rhinitis, cause unspecified   . Allergy   . Alopecia, unspecified   . Anemia    pernicious anemia  . Arthritis    hands, knees, hips  . Asthma   . Cancer ([place])    melanoma  . Chicken pox    . Depressive disorder, not elsewhere classified   . Disorder of bone and cartilage, unspecified   . Esophageal reflux   . GERD (gastroesophageal reflux disease)   . Hepatitis   . History of shingles   . Insomnia, unspecified   . Measles    red measles  . Motion sickness   . Mumps   . Other abnormal glucose   . Palpitations   . Syncope, vasovagal   . Tobacco use disorder   . Unspecified asthma(493.90)    exercise induced. Never hospitalized  . Unspecified essential hypertension   . Unspecified hearing loss   . Unspecified pruritic disorder     Review of systems:  Otherwise negative.    Physical Exam  Gen: Alert, oriented. Appears stated age.  HEENT: [place]/AT. PERRLA. Lungs: CTA, no wheezes. CV: RR nl S1, S2. Abd: soft, benign, no masses. BS+ Ext: No edema. Pulses 2+    Planned procedures: Proceed with colonoscopy. The patient understands the nature of the planned procedure, indications, risks, alternatives and potential complications including but not limited to bleeding, infection, perforation, damage to internal organs and possible oversedation/side effects from anesthesia. The patient agrees and gives consent to proceed.  Please refer to procedure notes for findings, recommendations and patient disposition/instructions.     Naleigha Raimondi K. Alice Reichert, M.D. Gastroenterology 09/25/2020  9:50 AM

## 2020-09-25 NOTE — Transfer of Care (Signed)
Immediate Anesthesia Transfer of Care Note  Patient: Gina Harper  Procedure(s) Performed: COLONOSCOPY WITH PROPOFOL (N/A )  Patient Location: PACU  Anesthesia Type:General  Level of Consciousness: drowsy  Airway & Oxygen Therapy: Patient Spontanous Breathing  Post-op Assessment: Post -op Vital signs reviewed and stable  Post vital signs: stable  Last Vitals:  Vitals Value Taken Time  BP 101/59 09/25/20 1020  Temp    Pulse 61 09/25/20 1021  Resp 11 09/25/20 1021  SpO2 96 % 09/25/20 1021  Vitals shown include unvalidated device data.  Last Pain:  Vitals:   09/25/20 0853  TempSrc: Temporal  PainSc: 0-No pain         Complications: No complications documented.

## 2020-09-25 NOTE — Op Note (Signed)
[facility] Patient Name: Gina Harper Procedure Date: 09/25/2020 9:52 AM MRN: [id number] Account #: [id number] Date of Birth: January 27, 1951 Admit Type: Outpatient Age: 70 Room: [facility] END[facility] ROOM 2 Gender: Female Note Status: Finalized Procedure:             Colonoscopy Indications:           High risk colon cancer surveillance: Personal history                         of colonic polyps Providers:             Benay Pike. Alice Reichert MD, MD Referring MD:          Jinny Sanders MD, MD (Referring MD) Medicines:             Propofol per Anesthesia Complications:         No immediate complications. Procedure:             Pre-Anesthesia Assessment:                        - The risks and benefits of the procedure and the                         sedation options and risks were discussed with the                         patient. All questions were answered and informed                         consent was obtained.                        - Patient identification and proposed procedure were                         verified prior to the procedure by the nurse. The                         procedure was verified in the procedure room.                        - ASA Grade Assessment: III - A patient with severe                         systemic disease.                        - After reviewing the risks and benefits, the patient                         was deemed in satisfactory condition to undergo the                         procedure.                        After obtaining informed consent, the colonoscope was                         passed under direct vision. Throughout  the procedure,                         the patient's blood pressure, pulse, and oxygen                         saturations were monitored continuously. The                         Colonoscope was introduced through the anus and                         advanced to the the cecum, identified by appendiceal                          orifice and ileocecal valve. The colonoscopy was                         performed without difficulty. The patient tolerated                         the procedure well. The quality of the bowel                         preparation was good. The ileocecal valve, appendiceal                         orifice, and rectum were photographed. Findings:      The perianal and digital rectal examinations were normal. Pertinent       negatives include normal sphincter tone and no palpable rectal lesions.      A single small localized angioectasia without bleeding was found in the       descending colon.      The exam was otherwise without abnormality on direct and retroflexion       views. Impression:            - A single non-bleeding colonic angioectasia.                        - The examination was otherwise normal on direct and                         retroflexion views.                        - No specimens collected. Recommendation:        - Patient has a contact number available for                         emergencies. The signs and symptoms of potential                         delayed complications were discussed with the patient.                         Return to normal activities tomorrow. Written                         discharge instructions were provided to the patient.                        -  Resume previous diet.                        - Continue present medications.                        - NO repeat colonoscopy due to age/comorbid status                        - You do NOT require further colon cancer screening                         measures (Annual stool testing (i.e. hemoccult, FIT,                         cologuard), sigmoidoscopy, colonoscopy or CT                         colonography). You should share this recommendation                         with your Primary Care provider.                        - Return to GI office as previously scheduled.                         - The findings and recommendations were discussed with                         the patient. Procedure Code(s):     --- Professional ---                        T7322, Colorectal cancer screening; colonoscopy on                         individual at high risk Diagnosis Code(s):     --- Professional ---                        K55.20, Angiodysplasia of colon without hemorrhage                        Z86.010, Personal history of colonic polyps CPT copyright 2019 American Medical Association. All rights reserved. The codes documented in this report are preliminary and upon coder review may  be revised to meet current compliance requirements. Efrain Sella MD, MD 09/25/2020 02:54:27 AM This report has been signed electronically. Number of Addenda: 0 Note Initiated On: 09/25/2020 9:52 AM Scope Withdrawal Time: 0 hours 6 minutes 28 seconds  Total Procedure Duration: 0 hours 13 minutes 1 second  Estimated Blood Loss:  Estimated blood loss: none.      [facility]

## 2020-09-25 NOTE — Anesthesia Preprocedure Evaluation (Signed)
Anesthesia Evaluation  Patient identified by MRN, date of birth, ID band Patient awake    Reviewed: Allergy & Precautions, H&P , NPO status , Patient's Chart, lab work & pertinent test results, reviewed documented beta blocker date and time   Airway Mallampati: II   Neck ROM: full    Dental  (+) Poor Dentition   Pulmonary neg shortness of breath, asthma , former smoker,    Pulmonary exam normal        Cardiovascular Exercise Tolerance: Good hypertension, On Medications negative cardio ROS Normal cardiovascular exam Rhythm:regular Rate:Normal     Neuro/Psych Seizures -,  PSYCHIATRIC DISORDERS Depression  Neuromuscular disease    GI/Hepatic GERD  Medicated,(+) Hepatitis -  Endo/Other  negative endocrine ROS  Renal/GU negative Renal ROS  negative genitourinary   Musculoskeletal   Abdominal   Peds  Hematology  (+) Blood dyscrasia, anemia ,   Anesthesia Other Findings Past Medical History: No date: 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[place])     Comment:  rectal spasm,syncope, nausea, ongoing for years No date: Allergic rhinitis, cause unspecified No date: Allergy No date: Alopecia, unspecified No date: Anemia     Comment:  pernicious anemia No date: Arthritis     Comment:  hands, knees, hips No date: Asthma No date: Cancer ([place])     Comment:  melanoma No date: Chicken pox No date: Depressive disorder, not elsewhere classified No date: Disorder of bone and cartilage, unspecified No date: Esophageal reflux No date: GERD (gastroesophageal reflux disease) No date: Hepatitis No date: History of shingles No date: Insomnia, unspecified No date: Measles     Comment:  red measles No date: Motion sickness No date: Mumps No date: Other abnormal glucose No date: Palpitations No date: Syncope, vasovagal No date: Tobacco use disorder No date: Unspecified asthma(493.90)     Comment:  exercise induced. Never hospitalized No date:  Unspecified essential hypertension No date: Unspecified hearing loss No date: Unspecified pruritic disorder Past Surgical History: 1995: bladder tact 07/09/2015: BROW LIFT; Bilateral     Comment:  Procedure: BLEPHAROPLASTY;  Surgeon: Karle Starch, MD;                Location: [place];  Service: Ophthalmology;                Laterality: Bilateral; 1993-1994: BUNIONECTOMY     Comment:  bilateral 04/20/2004: colonoscopy     Comment:  no polyps; normal.  Repeat in 10 years.  [place]. 05/20/2015: COLONOSCOPY WITH PROPOFOL; N/A     Comment:  Procedure: COLONOSCOPY WITH PROPOFOL;  Surgeon: Lollie Sails, MD;  Location: [facility] ENDOSCOPY;  Service:               Endoscopy;  Laterality: N/A; No date: COSMETIC SURGERY     Comment:  rhinoplasty 03/30/2005: ESOPHAGOGASTRODUODENOSCOPY 07/09/2015: PTOSIS REPAIR; Bilateral     Comment:  Procedure: PTOSIS REPAIR;  Surgeon: Karle Starch, MD;                Location: [place];  Service: Ophthalmology;                Laterality: Bilateral; No date: rhinoplasty with septoplasty No date: TONSILLECTOMY AND ADENOIDECTOMY No date: TUBAL LIGATION 1997: VAGINAL HYSTERECTOMY     Comment:  menorrhagia ovaries intact BMI    Body Mass Index: 28.90 kg/m     Reproductive/Obstetrics negative OB ROS  Anesthesia Physical Anesthesia Plan  ASA: III  Anesthesia Plan: General   Post-op Pain Management:    Induction:   PONV Risk Score and Plan:   Airway Management Planned:   Additional Equipment:   Intra-op Plan:   Post-operative Plan:   Informed Consent: I have reviewed the patients History and Physical, chart, labs and discussed the procedure including the risks, benefits and alternatives for the proposed anesthesia with the patient or authorized representative who has indicated his/her understanding and acceptance.     Dental Advisory Given  Plan Discussed  with: CRNA  Anesthesia Plan Comments:         Anesthesia Quick Evaluation

## 2020-09-25 NOTE — Interval H&P Note (Signed)
History and Physical Interval Note:  09/25/2020 9:50 AM  Gina Harper  has presented today for surgery, with the diagnosis of P HX CP.  The various methods of treatment have been discussed with the patient and family. After consideration of risks, benefits and other options for treatment, the patient has consented to  Procedure(s): COLONOSCOPY WITH PROPOFOL (N/A) as a surgical intervention.  The patient's history has been reviewed, patient examined, no change in status, stable for surgery.  I have reviewed the patient's chart and labs.  Questions were answered to the patient's satisfaction.     [place], [place]

## 2020-09-26 ENCOUNTER — Encounter: Payer: Self-pay | Admitting: Internal Medicine

## 2020-09-26 MED ORDER — AZITHROMYCIN 250 MG PO TABS: ORAL_TABLET | ORAL | 0 refills | Status: DC

## 2020-09-26 NOTE — Anesthesia Postprocedure Evaluation (Signed)
Anesthesia Post Note  Patient: Gina Harper  Procedure(s) Performed: COLONOSCOPY WITH PROPOFOL  Patient location during evaluation: PACU Anesthesia Type: General Level of consciousness: awake and alert Pain management: pain level controlled Vital Signs Assessment: post-procedure vital signs reviewed and stable Respiratory status: spontaneous breathing, nonlabored ventilation, respiratory function stable and patient connected to nasal cannula oxygen Cardiovascular status: blood pressure returned to baseline and stable Postop Assessment: no apparent nausea or vomiting Anesthetic complications: no   No notable events documented.   Last Vitals:  Vitals:   09/25/20 1041 09/25/20 1050  BP: 135/80   Pulse: (!) 53 (!) 51  Resp: 14 10  Temp:    SpO2: 99% 98%    Last Pain:  Vitals:   09/25/20 0853  TempSrc: Temporal  PainSc: 0-No pain                 Molli Barrows

## 2020-11-04 DIAGNOSIS — D1801 Hemangioma of skin and subcutaneous tissue: Secondary | ICD-10-CM | POA: Diagnosis not present

## 2020-11-04 DIAGNOSIS — Z85820 Personal history of malignant melanoma of skin: Secondary | ICD-10-CM | POA: Diagnosis not present

## 2020-11-04 DIAGNOSIS — L570 Actinic keratosis: Secondary | ICD-10-CM | POA: Diagnosis not present

## 2020-11-04 DIAGNOSIS — L853 Xerosis cutis: Secondary | ICD-10-CM | POA: Diagnosis not present

## 2020-11-04 DIAGNOSIS — L578 Other skin changes due to chronic exposure to nonionizing radiation: Secondary | ICD-10-CM | POA: Diagnosis not present

## 2020-11-04 DIAGNOSIS — L821 Other seborrheic keratosis: Secondary | ICD-10-CM | POA: Diagnosis not present

## 2020-11-04 DIAGNOSIS — Z872 Personal history of diseases of the skin and subcutaneous tissue: Secondary | ICD-10-CM | POA: Diagnosis not present

## 2020-11-04 DIAGNOSIS — Z86018 Personal history of other benign neoplasm: Secondary | ICD-10-CM | POA: Diagnosis not present

## 2020-11-05 ENCOUNTER — Other Ambulatory Visit: Payer: Self-pay | Admitting: Family Medicine

## 2020-11-05 DIAGNOSIS — Z1231 Encounter for screening mammogram for malignant neoplasm of breast: Secondary | ICD-10-CM

## 2020-11-14 ENCOUNTER — Telehealth (INDEPENDENT_AMBULATORY_CARE_PROVIDER_SITE_OTHER): Payer: Medicare Other | Admitting: Family Medicine

## 2020-11-14 ENCOUNTER — Encounter: Payer: Self-pay | Admitting: Family Medicine

## 2020-11-14 VITALS — BP 130/90 | HR 84 | Temp 101.3°F | Wt 159.0 lb

## 2020-11-14 DIAGNOSIS — U071 COVID-19: Secondary | ICD-10-CM | POA: Diagnosis not present

## 2020-11-14 MED ORDER — BENZONATATE 100 MG PO CAPS
100.0000 mg | ORAL_CAPSULE | Freq: Three times a day (TID) | ORAL | 0 refills | Status: DC | PRN
Start: 2020-11-14 — End: 2021-03-26

## 2020-11-14 MED ORDER — NIRMATRELVIR/RITONAVIR (PAXLOVID)TABLET: 3.0000 | ORAL_TABLET | Freq: Two times a day (BID) | ORAL | 0 refills | Status: AC

## 2020-11-14 NOTE — Progress Notes (Signed)
I connected with Gina Harper on 11/14/20 at 10:40 AM EDT by video and verified that I am speaking with the correct person using two identifiers.   I discussed the limitations, risks, security and privacy concerns of performing an evaluation and management service by video and the availability of in person appointments. I also discussed with the patient that there may be a patient responsible charge related to this service. The patient expressed understanding and agreed to proceed.  Patient location: Home Provider Location: [facility] Participants: Lesleigh Noe and Mariann Laster A Limb[place]   Subjective:     Gina Harper is a 70 y.o. female presenting for Covid Positive (Tested this morning. Onset of symptoms 11/12/20), Cough, Nasal Congestion, and Fever     Cough Associated symptoms include chills, a fever and myalgias. Pertinent negatives include no chest pain or shortness of breath.  Fever  Associated symptoms include congestion, coughing and nausea. Pertinent negatives include no chest pain, diarrhea or vomiting.   #Covid positive - 11/12/20 - fever 101.3 - responding to tylenol - no loss of taste or smell     Review of Systems  Constitutional:  Positive for chills and fever.  HENT:  Positive for congestion.   Respiratory:  Positive for cough. Negative for shortness of breath.   Cardiovascular:  Negative for chest pain.  Gastrointestinal:  Positive for nausea. Negative for diarrhea and vomiting.  Musculoskeletal:  Positive for arthralgias and myalgias.    Social History   Tobacco Use  Smoking Status Former   Packs/day: 1.00   Years: 17.00   Pack years: 17.00   Types: Cigarettes   Quit date: 05/22/1988   Years since quitting: 32.5  Smokeless Tobacco Never  Tobacco Comments   Quit 22 years ago, 1990        Objective:   BP Readings from Last 3 Encounters:  11/14/20 130/90  09/25/20 135/80  09/24/20 140/85   Wt Readings from Last 3 Encounters:   11/14/20 159 lb (72.1 kg)  09/25/20 158 lb (71.7 kg)  09/24/20 158 lb (71.7 kg)    BP 130/90   Pulse 84   Temp (!) 101.3 F (38.5 C) (Tympanic)   Wt 159 lb (72.1 kg)   SpO2 96%   BMI 29.08 kg/m   Physical Exam Constitutional:      Appearance: Normal appearance. She is not ill-appearing.  HENT:     Head: Normocephalic and atraumatic.     Right Ear: External ear normal.     Left Ear: External ear normal.  Eyes:     Conjunctiva/sclera: Conjunctivae normal.  Pulmonary:     Effort: Pulmonary effort is normal. No respiratory distress.  Neurological:     Mental Status: She is alert. Mental status is at baseline.  Psychiatric:        Mood and Affect: Mood normal.        Behavior: Behavior normal.        Thought Content: Thought content normal.        Judgment: Judgment normal.           Assessment & Plan:   Problem List Items Addressed This Visit   None Visit Diagnoses     COVID-19 virus infection    -  Primary   Relevant Medications   nirmatrelvir/ritonavir EUA (PAXLOVID) TABS   benzonatate (TESSALON) 100 MG capsule      Patient is at increased risk for severe covid - due to HTN, CAD, asthma, and age.  Within 5 days of onset. Normal liver and kidney function.   Discussed benefit of paxlovid and prescribed  Hold atorvastatin for 10 days  ER and return precautions  Isolation guidelines discussed.     Return if symptoms worsen or fail to improve.  Lesleigh Noe, MD

## 2020-11-14 NOTE — Telephone Encounter (Signed)
Please call to triage

## 2020-11-14 NOTE — Telephone Encounter (Signed)
Pt started with symptoms on 11/12/20 This morning pt tested + for covid and has fever 101.3, chills, body aches and prod cough with yellow phlegm. Pts husband tested positive earlier this week for covid and was placed on Paxlovid. Pt wants to know what she can do. No H/A now but yesterday H/A pain level 5. No SOB, diarrhea or vomiting, no loss of taste or smell; pt does have scratchy S/T, runny nose and head congestion. Pt does not feel like any distress but wonders if should start paxlovid. Pt is self quarantining,drinking plenty of fluids, resting and will take  tylenol for fever.Pt scheduled virtual appt with Dr Einar Pheasant today at 10:40. Sending note to DR Einar Pheasant and Dr Diona Browner as Juluis Rainier.

## 2020-11-14 NOTE — Telephone Encounter (Signed)
See note from 11/14/20

## 2020-12-19 ENCOUNTER — Other Ambulatory Visit: Payer: Self-pay

## 2020-12-19 ENCOUNTER — Ambulatory Visit
Admission: RE | Admit: 2020-12-19 | Discharge: 2020-12-19 | Disposition: A | Discharge status: Home / Self Care | Payer: Medicare Other | Source: Ambulatory Visit | Attending: Family Medicine | Admitting: Family Medicine

## 2020-12-19 DIAGNOSIS — Z780 Asymptomatic menopausal state: Secondary | ICD-10-CM | POA: Diagnosis not present

## 2020-12-19 DIAGNOSIS — Z13820 Encounter for screening for osteoporosis: Secondary | ICD-10-CM | POA: Insufficient documentation

## 2020-12-19 DIAGNOSIS — Z1231 Encounter for screening mammogram for malignant neoplasm of breast: Secondary | ICD-10-CM

## 2020-12-19 DIAGNOSIS — M8589 Other specified disorders of bone density and structure, multiple sites: Secondary | ICD-10-CM | POA: Insufficient documentation

## 2020-12-19 DIAGNOSIS — M8580 Other specified disorders of bone density and structure, unspecified site: Secondary | ICD-10-CM

## 2021-03-13 ENCOUNTER — Other Ambulatory Visit: Payer: Self-pay | Admitting: Family Medicine

## 2021-03-26 ENCOUNTER — Other Ambulatory Visit: Payer: Self-pay

## 2021-03-26 ENCOUNTER — Ambulatory Visit (INDEPENDENT_AMBULATORY_CARE_PROVIDER_SITE_OTHER): Payer: Medicare Other | Admitting: Nurse Practitioner

## 2021-03-26 ENCOUNTER — Telehealth: Payer: Self-pay | Admitting: *Deleted

## 2021-03-26 VITALS — BP 140/72 | HR 79 | Temp 100.8°F | Resp 16 | Ht 62.0 in | Wt 165.2 lb

## 2021-03-26 DIAGNOSIS — Z760 Encounter for issue of repeat prescription: Secondary | ICD-10-CM | POA: Diagnosis not present

## 2021-03-26 DIAGNOSIS — R5081 Fever presenting with conditions classified elsewhere: Secondary | ICD-10-CM

## 2021-03-26 DIAGNOSIS — R509 Fever, unspecified: Secondary | ICD-10-CM | POA: Diagnosis not present

## 2021-03-26 DIAGNOSIS — R051 Acute cough: Secondary | ICD-10-CM

## 2021-03-26 DIAGNOSIS — R110 Nausea: Secondary | ICD-10-CM

## 2021-03-26 LAB — POCT INFLUENZA A/B
Influenza A, POC: NEGATIVE
Influenza B, POC: NEGATIVE

## 2021-03-26 MED ORDER — BENZONATATE 100 MG PO CAPS: 100.0000 mg | ORAL_CAPSULE | Freq: Three times a day (TID) | ORAL | 0 refills | Status: AC | PRN

## 2021-03-26 MED ORDER — ALBUTEROL SULFATE HFA 108 (90 BASE) MCG/ACT IN AERS: 2.0000 | INHALATION_SPRAY | Freq: Four times a day (QID) | RESPIRATORY_TRACT | 0 refills | Status: DC | PRN

## 2021-03-26 MED ORDER — PROMETHAZINE HCL 25 MG RE SUPP: 25.0000 mg | Freq: Four times a day (QID) | RECTAL | 1 refills | Status: AC | PRN

## 2021-03-26 NOTE — Assessment & Plan Note (Signed)
Continue over-the-counter treatments inclusive of Tylenol as mainstay and ibuprofen as needed for fever reduction.  Continue to monitor.  Did inform patient to rest and stay hydrated

## 2021-03-26 NOTE — Assessment & Plan Note (Signed)
Patient was out of her albuterol inhaler and promethazine suppositories.  Refilled both for patient

## 2021-03-26 NOTE — Assessment & Plan Note (Signed)
Patient is having acute cough we will send in some Tessalon Perles to help out with that.  She is using in the past with some benefit

## 2021-03-26 NOTE — Telephone Encounter (Signed)
Patient has an appointment today with Romilda Garret NP at [facility] today 03/26/21 at 3:20.

## 2021-03-26 NOTE — Telephone Encounter (Signed)
Thank you. Will evaluate her at office visit

## 2021-03-26 NOTE — Telephone Encounter (Signed)
PLEASE NOTE: All timestamps contained within this report are represented as [place] Standard Time. CONFIDENTIALTY NOTICE: This fax transmission is intended only for the addressee. It contains information that is legally privileged, confidential or otherwise protected from use or disclosure. If you are not the intended recipient, you are strictly prohibited from reviewing, disclosing, copying using or disseminating any of this information or taking any action in reliance on or regarding this information. If you have received this fax in error, please notify us immediately by telephone so that we can arrange for its return to [place]. Phone: [phone], Toll-Free: [phone], Fax: [phone] Page: 1 of 2 Call Id: [id number] [place] Night - Client TELEPHONE ADVICE RECORD [facility] Patient Name: Gina Harper Gender: Female DOB: April 15, 1951 Age: 70 Y 2 M 20 D Return Phone Number: [phone] (Primary), [phone] (Secondary) Address: City/ State/ Zip: [place] [address] Client [facility] Night - Client Client Site [place] Provider Eliezer Lofts - MD Contact Type Call Who Is Calling Patient / Member / Family / Caregiver Call Type Triage / Clinical Relationship To Patient Self Return Phone Number [phone] (Primary) Chief Complaint WHEEZING Reason for Call Symptomatic / Request for [place] states she wants a visit with her dr today. She has the flu and is wheezing. Translation No Nurse Assessment Nurse: Ronnald Ramp, RN, Miranda Date/Time (Eastern Time): 03/26/2021 7:33:46 AM Confirm and document reason for call. If symptomatic, describe symptoms. ---Caller states she has bodyaches and elevated body temp. She woke up at 1:30am feeling like she could not breath. She is wheezing. She has a cough, sore throat, and headache. Does the patient have any new or  worsening symptoms? ---Yes Will a triage be completed? ---Yes Related visit to physician within the last 2 weeks? ---No Does the PT have any chronic conditions? (i.e. diabetes, asthma, this includes High risk factors for pregnancy, etc.) ---Yes List chronic conditions. ---Asthma, HTN, High Cholesterol, Depression Is this a behavioral health or substance abuse call? ---No Guidelines Guideline Title Affirmed Question Affirmed Notes Nurse Date/Time (Eastern Time) Asthma Attack MILD asthma attack (e.g., no SOB at rest, mild SOB with walking, speaks normally in sentences, mild wheezing) Ronnald Ramp, RN, Miranda 03/26/2021 7:37:16 AM PLEASE NOTE: All timestamps contained within this report are represented as [place] Standard Time. CONFIDENTIALTY NOTICE: This fax transmission is intended only for the addressee. It contains information that is legally privileged, confidential or otherwise protected from use or disclosure. If you are not the intended recipient, you are strictly prohibited from reviewing, disclosing, copying using or disseminating any of this information or taking any action in reliance on or regarding this information. If you have received this fax in error, please notify us immediately by telephone so that we can arrange for its return to [place]. Phone: [phone], Toll-Free: [phone], Fax: [phone] Page: 2 of 2 Call Id: [id number] [place]. Time Eilene Ghazi Time) Disposition Final User 03/26/2021 7:31:18 AM Send to Urgent Joen Laura, Arianna 03/26/2021 7:46:11 AM See PCP within 24 Hours Yes Ronnald Ramp, RN, Miranda Disposition Overriden: Home Care Override Reason: Patient's symptoms need a higher level of care Caller Disagree/Comply Comply Caller Understands Yes PreDisposition Call Doctor Care Advice Given Per Guideline SEE PCP WITHIN 24 HOURS: * IF OFFICE WILL BE OPEN: You need to be examined within the next 24 hours. Call your doctor (or NP/PA) when the office opens and make an  appointment. DRINK PLENTY OF LIQUIDS AND USE A HUMIDIFIER: *  Drink plenty of liquids. It is important to stay well-hydrated. * A healthy adult should drink 8 cups (240 ml) or more of liquid each day. CARE ADVICE given per Asthma Attack (Adult) guideline. * You become worse CALL BACK IF: * Quick-relief asthma medicine (such as albuterol by inhaler or nebulizer) is needed more often than every 4 hours Referrals REFERRED TO PCP OFFICE

## 2021-03-26 NOTE — Patient Instructions (Signed)
Can use tylenol 650mg  Every 6 hours as needed If you need something in between you can use ibuprofen if needed but start with tylenol. Keep a check on your symptoms. Retest for coivd tomorrow and Friday. If positive let the office now

## 2021-03-26 NOTE — Telephone Encounter (Signed)
Spoke to patient by telephone and was advised that her covid test is still negative. Patient stated that she feels that she has the flu.

## 2021-03-26 NOTE — Telephone Encounter (Signed)
Spoke to patient by telephone and was advised that her oxygen level this morning has been 96% and 95%. Patient stated that she started with symptoms Monday with a bad sore throat. Patient stated that she did a home covid test and it was negative. Patient stated that she is sure that she has the flu because of her body aches. Patient stated that she has no idea what all she has been exposed to because she did have a party at her house Saturday and did not start feeling back until Monday. Patient is going to do another coivd test and I will call her back to get the results. Patient stated that her breathing is okay now, but at 1:30 am she couldn't take a deep breath.

## 2021-03-26 NOTE — Assessment & Plan Note (Signed)
Experiencing nausea.  States when she has nausea she does have vagal events in which additional use of suppository to help keep nausea at bay does have a prescription for that in the past we will refill today.

## 2021-03-26 NOTE — Telephone Encounter (Signed)
If patient can check her O2 at home to make sure her oxygen level is good that would be ideal. If she is having a hard time breathing or really short of breath sitting still she may benefit for UC visit. If she feels ok and O2 is fine she can be seen by me at 320

## 2021-03-26 NOTE — Progress Notes (Signed)
Acute Office Visit  Subjective:    Patient ID: Gina Harper, female    DOB: 29-Oct-1950, 70 y.o.   MRN: [id number]  Chief Complaint  Patient presents with   Fever    Sx 03/24/21-sore throat, fever, cough, head congestion, body aches, sinus pain/facial pain. Covid test negative on 12/6 and 12/7     Patient is in today for Fever  Symptoms stared on 03/24/2021 Covid tested today and yesterday at home and both negative Has had flu vaccine Pfizer x2 and one booster No sick contacts Has been using zyretec and tylenol. 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[place])    rectal spasm,syncope, nausea, ongoing for years   Allergic rhinitis, cause unspecified    Allergy    Alopecia, unspecified    Anemia    pernicious anemia   Arthritis    hands, knees, hips   Asthma    Cancer ([place])    melanoma   Chicken pox    Depressive disorder, not elsewhere classified    Disorder of bone and cartilage, unspecified    Esophageal reflux    GERD (gastroesophageal reflux disease)    Hepatitis    History of shingles    Insomnia, unspecified    Measles    red measles   Motion sickness    Mumps    Other abnormal glucose    Palpitations    Syncope, vasovagal    Tobacco use disorder    Unspecified asthma(493.90)    exercise induced. Never hospitalized   Unspecified essential hypertension    Unspecified hearing loss    Unspecified pruritic disorder     Past Surgical History:  Procedure Laterality Date   bladder tact  1995   BROW LIFT Bilateral 07/09/2015   Procedure: BLEPHAROPLASTY;  Surgeon: Karle Starch, MD;  Location: [place];  Service: Ophthalmology;  Laterality: Bilateral;   BUNIONECTOMY  [id number]   bilateral   colonoscopy  04/20/2004   no polyps; normal.  Repeat in 10 years.  [place].   COLONOSCOPY WITH PROPOFOL N/A 05/20/2015   Procedure: COLONOSCOPY WITH PROPOFOL;  Surgeon: Lollie Sails, MD;  [facility] ENDOSCOPY;  Service: Endoscopy;  Laterality: N/A;   COLONOSCOPY WITH PROPOFOL N/A 09/25/2020   Procedure: COLONOSCOPY WITH PROPOFOL;  Surgeon: Toledo, Benay Pike, MD;  Location: [facility] ENDOSCOPY;  Service: Gastroenterology;  Laterality: N/A;   COSMETIC SURGERY     rhinoplasty   ESOPHAGOGASTRODUODENOSCOPY  03/30/2005   PTOSIS REPAIR Bilateral 07/09/2015   Procedure: PTOSIS REPAIR;  Surgeon: Karle Starch, MD;  Location: [place];  Service: Ophthalmology;  Laterality: Bilateral;   rhinoplasty with septoplasty     TONSILLECTOMY AND ADENOIDECTOMY     TUBAL LIGATION     VAGINAL HYSTERECTOMY  1997   menorrhagia ovaries intact    Family History  Problem Relation Age of Onset   Cancer Mother        Lung, Bladder   Stroke Father 20       TIAs   Heart disease Father 58       AMI x 2.   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  Tobacco Use   Smoking status: Former    Packs/day: 1.00    Years: 17.00    Pack years: 17.00    Types: Cigarettes    Quit date: 05/22/1988    Years since quitting: 32.8   Smokeless tobacco: Never   Tobacco comments:    Quit 22 years ago, 1990  Vaping Use   Vaping Use: Never used  Substance and Sexual Activity   Alcohol use: Yes    Alcohol/week: 6.0 standard drinks    Types: 6 Standard drinks or equivalent per week    Comment: moderate white wine 5 glasses per week   Drug use: No   Sexual activity: Yes    Partners: Male    Birth control/protection: Post-menopausal, Surgical  Other Topics Concern   Not on file  Social History Narrative      Marital status:  Married x 40  years, happily married; no domestic abuse.      Children:  1 child; 3 grandchildren local.      Employment:  Retired in 09/2012; [employer] x 21 years; happy.  Pre-admission testing.      Tobacco:  Previous smoker. Quit 1990      Alcohol:  2 glasses of wine three nights per week.      Drugs; none      Exercise:  Treadmill, elliptical, Zumba.  [employer] 3x per week.      Seatbelt:  Always uses seat belts.       +Smoke alarm and carbon monoxide detector in the home.       Guns:  Guns stored in locked cabinet.       Caffeine use: Coffee, Tea, Carbonated beverages, 3 servings / day.                 Social Determinants of Health   Financial Resource Strain: Not on file  Food Insecurity: Not on file  Transportation Needs: Not on file  Physical Activity: Not on file  Stress: Not on file  Social Connections: Not on file  Intimate Partner Violence: Not on file    Outpatient Medications Prior to Visit  Medication Sig Dispense Refill   albuterol (PROAIR HFA) 108 (90 Base) MCG/ACT inhaler Inhale 2 puffs into the lungs every 6 (six) hours as needed. Dispense:  3 inhalers 18 g 1   atorvastatin (LIPITOR) 10 MG tablet TAKE 1 TABLET BY MOUTH  DAILY 90 tablet 0   Biotin 10000 MCG TABS Take 1 tablet by mouth daily.     Calcium Carb-Cholecalciferol (CALCIUM CARBONATE-VITAMIN D3 PO) Take by mouth.     cetirizine (ZYRTEC) 10 MG tablet Take 10 mg by mouth as needed.      Cholecalciferol (VITAMIN D3) 2000 UNITS TABS Take by mouth daily.      docusate sodium (COLACE) 100 MG capsule Take 100 mg by mouth daily.     estradiol (ESTRACE VAGINAL) 0.1 MG/GM vaginal cream Place 1 Applicatorful vaginally daily as needed. 42.5 g 11   FLUoxetine (PROZAC) 20 MG capsule TAKE 1 CAPSULE BY MOUTH  DAILY 90 capsule 1   hydrochlorothiazide (HYDRODIURIL) 25 MG tablet TAKE 1 TABLET BY MOUTH  DAILY 90 tablet 1   Multiple Vitamin (MULTI-VITAMINS) TABS Take by mouth daily.     Potassium Gluconate 550 MG TABS  Take by mouth daily.       promethazine (PHENERGAN) 25 MG suppository Place 1 suppository (25 mg total) rectally every 6 (six) hours as needed. 12 each 1   vitamin B-12 (CYANOCOBALAMIN) 1000 MCG tablet Take 1,000 mcg by mouth daily.     Alpha-Lipoic Acid 200 MG CAPS Take 200 mg by mouth daily at 12 noon.     benzonatate (TESSALON) 100 MG capsule Take 1 capsule (100 mg total) by mouth 3 (three) times daily as needed for cough. 21 capsule 0   famotidine (PEPCID) 40 MG tablet Take 40 mg by mouth daily.     Probiotic, Lactobacillus, CAPS Take by mouth.     rOPINIRole (REQUIP) 0.25 MG tablet Take 0.25 mg by mouth 3 (three) times daily.     No facility-administered medications prior to visit.    Allergies  Allergen Reactions   Cefdinir Hives   Lisinopril Cough and Other (See Comments)   Meloxicam Swelling   Omnipen [Ampicillin] Other (See Comments)   Augmentin [Amoxicillin-Pot Clavulanate] Rash   Clarithromycin Rash    Review of Systems  Constitutional:  Positive for chills, fatigue and fever.  HENT:  Positive for congestion, ear pain, rhinorrhea, sinus pressure, sinus pain and sore throat (is better than it was).   Respiratory:  Positive for cough (clear and yellow). Negative for shortness of breath.   Cardiovascular:  Negative for chest pain.  Gastrointestinal:  Positive for nausea. Negative for abdominal pain, diarrhea and vomiting.  Musculoskeletal:  Positive for arthralgias and myalgias.  Neurological:  Positive for headaches.      Objective:    Physical Exam Vitals and nursing note reviewed.  Constitutional:      Appearance: Normal appearance.  HENT:     Right Ear: Tympanic membrane, ear canal and external ear normal.     Left Ear: Tympanic membrane, ear canal and external ear normal.     Nose:     Right Sinus: No maxillary sinus tenderness or frontal sinus tenderness.     Left Sinus: Maxillary sinus tenderness present. No frontal sinus tenderness.     Mouth/Throat:     Mouth: Mucous membranes are  moist.     Pharynx: Oropharynx is clear.  Eyes:     Comments: Wears corrective lenses  Cardiovascular:     Rate and Rhythm: Normal rate and regular rhythm.  Pulmonary:     Effort: Pulmonary effort is normal.     Breath sounds: Normal breath sounds.  Abdominal:     General: Bowel sounds are normal.  Lymphadenopathy:     Cervical: No cervical adenopathy.  Neurological:     Mental Status: She is alert.  Psychiatric:        Mood and Affect: Mood normal.        Behavior: Behavior normal.        Thought Content: Thought content normal.        Judgment: Judgment normal.    BP 140/72   Pulse 79   Temp (!) 100.8 F (38.2 C)   Resp 16   Ht 5\' 2"  (1.575 m)   Wt 165 lb 4 oz (75 kg)   SpO2 96%   BMI 30.22 kg/m  Wt Readings from Last 3 Encounters:  03/26/21 165 lb 4 oz (75 kg)  11/14/20 159 lb (72.1 kg)  09/25/20 158 lb (71.7 kg)    Health Maintenance Due  Topic Date Due   Zoster Vaccines- Shingrix (1 of 2) Never done   COVID-19 Vaccine (3 -  Booster for [employer] series) 08/16/2019    There are no preventive care reminders to display for this patient.   Lab Results  Component Value Date   TSH 2.41 09/07/2014   Lab Results  Component Value Date   WBC 9.2 02/25/2019   HGB 12.9 02/25/2019   HCT 37.6 02/25/2019   MCV 91.9 02/25/2019   PLT 269 02/25/2019   Lab Results  Component Value Date   NA 142 07/19/2020   K 3.6 07/19/2020   CO2 32 07/19/2020   GLUCOSE 105 (H) 07/19/2020   BUN 21 07/19/2020   CREATININE 0.78 07/19/2020   BILITOT 0.4 07/19/2020   ALKPHOS 66 07/19/2020   AST 20 07/19/2020   ALT 21 07/19/2020   PROT 7.3 07/19/2020   ALBUMIN 4.5 07/19/2020   CALCIUM 9.4 07/19/2020   ANIONGAP 16 (H) 02/25/2019   GFR 76.89 07/19/2020   Lab Results  Component Value Date   CHOL 131 07/19/2020   Lab Results  Component Value Date   HDL 59.40 07/19/2020   Lab Results  Component Value Date   LDLCALC 57 07/19/2020   Lab Results  Component Value Date   TRIG  71.0 07/19/2020   Lab Results  Component Value Date   CHOLHDL 2 07/19/2020   Lab Results  Component Value Date   HGBA1C 5.7 03/29/2020       Assessment & Plan:   Problem List Items Addressed This Visit       Other   Fever - Primary    Continue over-the-counter treatments inclusive of Tylenol as mainstay and ibuprofen as needed for fever reduction.  Continue to monitor.  Did inform patient to rest and stay hydrated      Relevant Orders   POCT Influenza A/B (Completed)   Acute cough    Patient is having acute cough we will send in some Tessalon Perles to help out with that.  She is using in the past with some benefit      Relevant Medications   benzonatate (TESSALON) 100 MG capsule   Nausea    Experiencing nausea.  States when she has nausea she does have vagal events in which additional use of suppository to help keep nausea at bay does have a prescription for that in the past we will refill today.      Relevant Medications   promethazine (PHENERGAN) 25 MG suppository   Medication refill    Patient was out of her albuterol inhaler and promethazine suppositories.  Refilled both for patient      Relevant Medications   albuterol (PROAIR HFA) 108 (90 Base) MCG/ACT inhaler     No orders of the defined types were placed in this encounter.  This visit occurred during the SARS-CoV-2 public health emergency.  Safety protocols were in place, including screening questions prior to the visit, additional usage of staff PPE, and extensive cleaning of exam room while observing appropriate contact time as indicated for disinfecting solutions.   Romilda Garret, NP

## 2021-04-01 ENCOUNTER — Telehealth: Payer: Self-pay | Admitting: Family Medicine

## 2021-04-01 MED ORDER — AZITHROMYCIN 250 MG PO TABS: ORAL_TABLET | ORAL | 0 refills | Status: DC

## 2021-04-01 NOTE — Telephone Encounter (Signed)
Ms. Scoggin notified as instructed by telephone.

## 2021-04-01 NOTE — Telephone Encounter (Signed)
Spoke with patient, patient still has severe cough-and episodes linger for a few seconds, coughing up some dark green phlegm-thick and hard time getting it up every time. Patient has taking hycodan syrup that her husband had. Patient states when she is sitting still her cough is settled, headache, sinus pressure/sinus very tender under her eyes and top of her teeth, stuffy nose, throat is better. No fever-98.2. Patient used inhaler one time and felt like she couldn't breath/ hard to describe and did not use it since, Tessalon Perles help minimum. Has been taking Mucinex 12 hour strength, Zyrtec. Overall patient states she is feeling better overall but the main thing is the hacking/constant cough with thick dark green mucus. Please advise. Gina Harper is out of the office this week.

## 2021-04-01 NOTE — Telephone Encounter (Signed)
Sent in rx for antibioticn ( azithromycin) to cover for bacterial bronchitis.Marland Kitchen let pt know  it was sent to [facility] [place].

## 2021-04-01 NOTE — Telephone Encounter (Signed)
Pt called wanting an update on bedsole calling her

## 2021-04-01 NOTE — Addendum Note (Signed)
Addended byEliezer Lofts E on: 04/01/2021 05:01 PM   Modules accepted: Orders

## 2021-04-01 NOTE — Telephone Encounter (Signed)
Pt called stating that she had a OV with Matt on 03/26/21. Pt states that Catalina Antigua stated that if she didn't feel any better to call. Pt states that she doesn't feel any better. Pt states that she thinks she needs an antibiotic. Please advise.

## 2021-04-04 ENCOUNTER — Telehealth: Payer: Self-pay | Admitting: Family Medicine

## 2021-04-04 DIAGNOSIS — R7303 Prediabetes: Secondary | ICD-10-CM

## 2021-04-04 DIAGNOSIS — E7800 Pure hypercholesterolemia, unspecified: Secondary | ICD-10-CM

## 2021-04-04 DIAGNOSIS — E538 Deficiency of other specified B group vitamins: Secondary | ICD-10-CM

## 2021-04-04 NOTE — Telephone Encounter (Signed)
-----   Message from Ellamae Sia sent at 04/03/2021  8:17 AM EST ----- Regarding: Lab orders for Friday, 12.30.22 Patient is scheduled for CPX labs, please order future labs, Thanks , Karna Christmas

## 2021-04-08 ENCOUNTER — Encounter: Payer: Self-pay | Admitting: Family Medicine

## 2021-04-08 ENCOUNTER — Ambulatory Visit (INDEPENDENT_AMBULATORY_CARE_PROVIDER_SITE_OTHER): Payer: Medicare Other | Admitting: Family Medicine

## 2021-04-08 ENCOUNTER — Other Ambulatory Visit: Payer: Self-pay

## 2021-04-08 VITALS — BP 120/80 | HR 61 | Temp 98.1°F | Ht 62.0 in | Wt 167.4 lb

## 2021-04-08 DIAGNOSIS — R042 Hemoptysis: Secondary | ICD-10-CM | POA: Diagnosis not present

## 2021-04-08 DIAGNOSIS — J209 Acute bronchitis, unspecified: Secondary | ICD-10-CM

## 2021-04-08 NOTE — Assessment & Plan Note (Signed)
Resolving symptoms.Marland Kitchen likely cough is post infectious and bloody sputum is due to oropharyngeal irritation.  Will treat with nasal steroid, saline and mucinex and time.  Pt agreeable as she feels dramatically better today.

## 2021-04-08 NOTE — Assessment & Plan Note (Signed)
Hx of smoking, remotely.  Likely oropharyngeal  Irritation, but if persistent will eval with CXR.  Lung exam clear in office today.  On no blood thinners.

## 2021-04-08 NOTE — Patient Instructions (Addendum)
Call if bloody sputum not resolving and if you do not continue to improve with time... we will condier chest -ray if persistent.  Continue nasal steroid but increase to 2 sprays per nostril daily.  Continue nasal saline, mucinex.

## 2021-04-08 NOTE — Progress Notes (Signed)
Patient ID: Gina Harper, female    DOB: 1951/02/26, 70 y.o.   MRN: [id number]  This visit was conducted in person.  BP 120/80    Pulse 61    Temp 98.1 F (36.7 C) (Temporal)    Ht 5\' 2"  (1.575 m)    Wt 167 lb 6 oz (75.9 kg)    SpO2 97%    BMI 30.61 kg/m    CC: Chief Complaint  Patient presents with   Cough    Ongoing-Finished antibiotics.  Has noticed some bloody phlegm    Subjective:   HPI: Gina Harper is a 70 y.o. female presenting on 04/08/2021 for Cough (Ongoing-Finished antibiotics.  Has noticed some bloody phlegm)  Seen by Romilda Garret, NP on 12/7. Note reviewed in detail. Copied pertinent info below: Symptoms stared on 03/24/2021 Covid tested 12/6 and 12/7 both negative Has had flu vaccine Pfizer x2 and one booster   In office flu test was negative on 12/7 Treated with prednisone taper, albuterol, benzonatate and nighttime cough suppressant.   Hx of asthma.   Called with continued cough and change in color of mucus... treated with azithromycin.   Today she reports she has had continued cough, bloody sputum, occ clots. Mucus appears clearer. In last 24 hour she feels dramatically better.  She has facial pressure and nasal discharge, and post nasal drip... but it is much better this AM> No ear pain. No sore throat. Resolved wheeze and shortness of breath.  No further fever.  Albuterol made her feels more congested.  She is using nasal steroid spray.       Relevant past medical, surgical, family and social history reviewed and updated as indicated. Interim medical history since our last visit reviewed. Allergies and medications reviewed and updated. Outpatient Medications Prior to Visit  Medication Sig Dispense Refill   albuterol (PROAIR HFA) 108 (90 Base) MCG/ACT inhaler Inhale 2 puffs into the lungs every 6 (six) hours as needed. 8 g 0   atorvastatin (LIPITOR) 10 MG tablet TAKE 1 TABLET BY MOUTH  DAILY 90 tablet 0   Benzonatate (TESSALON PERLES PO)  Take 1 capsule by mouth every 8 (eight) hours as needed.     Biotin 10000 MCG TABS Take 1 tablet by mouth daily.     Calcium Carb-Cholecalciferol (CALCIUM CARBONATE-VITAMIN D3 PO) Take by mouth.     cetirizine (ZYRTEC) 10 MG tablet Take 10 mg by mouth as needed.      Cholecalciferol (VITAMIN D3) 2000 UNITS TABS Take by mouth daily.      docusate sodium (COLACE) 100 MG capsule Take 100 mg by mouth daily.     estradiol (ESTRACE VAGINAL) 0.1 MG/GM vaginal cream Place 1 Applicatorful vaginally daily as needed. 42.5 g 11   FLUoxetine (PROZAC) 20 MG capsule TAKE 1 CAPSULE BY MOUTH  DAILY 90 capsule 1   guaiFENesin (MUCINEX PO) Take 1 tablet by mouth in the morning and at bedtime.     hydrochlorothiazide (HYDRODIURIL) 25 MG tablet TAKE 1 TABLET BY MOUTH  DAILY 90 tablet 1   Multiple Vitamin (MULTI-VITAMINS) TABS Take by mouth daily.     Potassium Gluconate 550 MG TABS Take by mouth daily.      promethazine (PHENERGAN) 25 MG suppository Place 1 suppository (25 mg total) rectally every 6 (six) hours as needed. 12 each 1   vitamin B-12 (CYANOCOBALAMIN) 1000 MCG tablet Take 1,000 mcg by mouth daily.     azithromycin (ZITHROMAX) 250 MG tablet 2 tab po  x 1 day then 1 tab po daily 6 tablet 0   No facility-administered medications prior to visit.     Per HPI unless specifically indicated in ROS section below Review of Systems  Constitutional:  Negative for fatigue and fever.  HENT:  Negative for ear pain.   Eyes:  Negative for pain.  Respiratory:  Negative for chest tightness and shortness of breath.   Cardiovascular:  Negative for chest pain, palpitations and leg swelling.  Gastrointestinal:  Negative for abdominal pain.  Genitourinary:  Negative for dysuria.  Objective:  BP 120/80    Pulse 61    Temp 98.1 F (36.7 C) (Temporal)    Ht 5\' 2"  (1.575 m)    Wt 167 lb 6 oz (75.9 kg)    SpO2 97%    BMI 30.61 kg/m   Wt Readings from Last 3 Encounters:  04/08/21 167 lb 6 oz (75.9 kg)  03/26/21 165 lb 4  oz (75 kg)  11/14/20 159 lb (72.1 kg)      Physical Exam Constitutional:      General: She is not in acute distress.    Appearance: Normal appearance. She is well-developed. She is not ill-appearing or toxic-appearing.  HENT:     Head: Normocephalic.     Right Ear: Hearing, tympanic membrane, ear canal and external ear normal. Tympanic membrane is not erythematous, retracted or bulging.     Left Ear: Hearing, tympanic membrane, ear canal and external ear normal. Tympanic membrane is not erythematous, retracted or bulging.     Nose: No mucosal edema or rhinorrhea.     Right Sinus: No maxillary sinus tenderness or frontal sinus tenderness.     Left Sinus: No maxillary sinus tenderness or frontal sinus tenderness.     Mouth/Throat:     Pharynx: Uvula midline.  Eyes:     General: Lids are normal. Lids are everted, no foreign bodies appreciated.     Conjunctiva/sclera: Conjunctivae normal.     Pupils: Pupils are equal, round, and reactive to light.  Neck:     Thyroid: No thyroid mass or thyromegaly.     Vascular: No carotid bruit.     Trachea: Trachea normal.  Cardiovascular:     Rate and Rhythm: Normal rate and regular rhythm.     Pulses: Normal pulses.     Heart sounds: Normal heart sounds, S1 normal and S2 normal. No murmur heard.   No friction rub. No gallop.  Pulmonary:     Effort: Pulmonary effort is normal. No tachypnea or respiratory distress.     Breath sounds: Normal breath sounds. No decreased breath sounds, wheezing, rhonchi or rales.  Abdominal:     General: Bowel sounds are normal.     Palpations: Abdomen is soft.     Tenderness: There is no abdominal tenderness.  Musculoskeletal:     Cervical back: Normal range of motion and neck supple.  Skin:    General: Skin is warm and dry.     Findings: No rash.  Neurological:     Mental Status: She is alert.  Psychiatric:        Mood and Affect: Mood is not anxious or depressed.        Speech: Speech normal.         Behavior: Behavior normal. Behavior is cooperative.        Thought Content: Thought content normal.        Judgment: Judgment normal.      Results for orders placed or  performed in visit on 03/26/21  POCT Influenza A/B  Result Value Ref Range   Influenza A, POC Negative Negative   Influenza B, POC Negative Negative    This visit occurred during the SARS-CoV-2 public health emergency.  Safety protocols were in place, including screening questions prior to the visit, additional usage of staff PPE, and extensive cleaning of exam room while observing appropriate contact time as indicated for disinfecting solutions.   COVID 19 screen:  No recent travel or known exposure to COVID19 The patient denies respiratory symptoms of COVID 19 at this time. The importance of social distancing was discussed today.   Assessment and Plan Problem List Items Addressed This Visit     Acute bronchitis - Primary     Resolving symptoms.Marland Kitchen likely cough is post infectious and bloody sputum is due to oropharyngeal irritation.  Will treat with nasal steroid, saline and mucinex and time.  Pt agreeable as she feels dramatically better today.       Bloody sputum    Hx of smoking, remotely.  Likely oropharyngeal  Irritation, but if persistent will eval with CXR.  Lung exam clear in office today.  On no blood thinners.           Eliezer Lofts, MD

## 2021-04-16 NOTE — Progress Notes (Signed)
Subjective:   Gina Harper is a 70 y.o. female who presents for Medicare Annual (Subsequent) preventive examination.  I connected with Eleesha Purkey today by telephone and verified that I am speaking with the correct person using two identifiers. Location patient: home Location provider: work Persons participating in the virtual visit: patient, Marine scientist.    I discussed the limitations, risks, security and privacy concerns of performing an evaluation and management service by telephone and the availability of in person appointments. I also discussed with the patient that there may be a patient responsible charge related to this service. The patient expressed understanding and verbally consented to this telephonic visit.    Interactive audio and video telecommunications were attempted between this provider and patient, however failed, due to patient having technical difficulties OR patient did not have access to video capability.  We continued and completed visit with audio only.  Some vital signs may be absent or patient reported.   Time Spent with patient on telephone encounter: 20 minutes  Review of Systems     Cardiac Risk Factors include: advanced age (>33men, >28 women);hypertension     Objective:    Today's Vitals   04/18/21 0945  Weight: 162 lb (73.5 kg)  Height: 5\' 2"  (1.575 m)   Body mass index is 29.63 kg/m.  Advanced Directives 04/18/2021 09/25/2020 03/07/2019 02/25/2019 07/09/2015 05/20/2015  Does Patient Have a Medical Advance Directive? Yes Yes Yes Yes Yes Yes  Type of Paramedic of [place];Living will [place];Living will [place];Living will [place];Living will - -  Does patient want to make changes to medical advance directive? Yes (MAU/Ambulatory/Procedural Areas - Information given) - - - - -  Copy of Healthcare Power of Attorney in Chart? - - No - copy requested - No - copy  requested -    Current Medications (verified) Outpatient Encounter Medications as of 04/18/2021  Medication Sig   albuterol (PROAIR HFA) 108 (90 Base) MCG/ACT inhaler Inhale 2 puffs into the lungs every 6 (six) hours as needed.   atorvastatin (LIPITOR) 10 MG tablet TAKE 1 TABLET BY MOUTH  DAILY   Benzonatate (TESSALON PERLES PO) Take 1 capsule by mouth every 8 (eight) hours as needed.   Biotin 10000 MCG TABS Take 1 tablet by mouth daily.   Calcium Carb-Cholecalciferol (CALCIUM CARBONATE-VITAMIN D3 PO) Take by mouth.   cetirizine (ZYRTEC) 10 MG tablet Take 10 mg by mouth as needed.    Cholecalciferol (VITAMIN D3) 2000 UNITS TABS Take by mouth daily.    docusate sodium (COLACE) 100 MG capsule Take 100 mg by mouth daily.   estradiol (ESTRACE VAGINAL) 0.1 MG/GM vaginal cream Place 1 Applicatorful vaginally daily as needed.   FLUoxetine (PROZAC) 20 MG capsule TAKE 1 CAPSULE BY MOUTH  DAILY   guaiFENesin (MUCINEX PO) Take 1 tablet by mouth in the morning and at bedtime.   hydrochlorothiazide (HYDRODIURIL) 25 MG tablet TAKE 1 TABLET BY MOUTH  DAILY   Multiple Vitamin (MULTI-VITAMINS) TABS Take by mouth daily.   Potassium Gluconate 550 MG TABS Take by mouth daily.    promethazine (PHENERGAN) 25 MG suppository Place 1 suppository (25 mg total) rectally every 6 (six) hours as needed.   vitamin B-12 (CYANOCOBALAMIN) 1000 MCG tablet Take 1,000 mcg by mouth daily.   No facility-administered encounter medications on file as of 04/18/2021.    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[place])    rectal spasm,syncope, nausea, ongoing for years   Allergic rhinitis, cause unspecified    Allergy    Alopecia, unspecified    Anemia    pernicious anemia   Arthritis    hands, knees, hips   Asthma    Cancer ([place])    melanoma   Chicken pox     Depressive disorder, not elsewhere classified    Disorder of bone and cartilage, unspecified    Esophageal reflux    GERD (gastroesophageal reflux disease)    Hepatitis    History of shingles    Insomnia, unspecified    Measles    red measles   Motion sickness    Mumps    Other abnormal glucose    Palpitations    Syncope, vasovagal    Tobacco use disorder    Unspecified asthma(493.90)    exercise induced. Never hospitalized   Unspecified essential hypertension    Unspecified hearing loss    Unspecified pruritic disorder    Past Surgical History:  Procedure Laterality Date   bladder tact  1995   BROW LIFT Bilateral 07/09/2015   Procedure: BLEPHAROPLASTY;  Surgeon: Karle Starch, MD;  Location: [facility];  Service: Ophthalmology;  Laterality: Bilateral;   BUNIONECTOMY  [id number]   bilateral   colonoscopy  04/20/2004   no polyps; normal.  Repeat in 10 years.  [place].   COLONOSCOPY WITH PROPOFOL N/A 05/20/2015   Procedure: COLONOSCOPY WITH PROPOFOL;  Surgeon: Lollie Sails, MD;  [facility] ENDOSCOPY;  Service: Endoscopy;  Laterality: N/A;   COLONOSCOPY WITH PROPOFOL N/A 09/25/2020   Procedure: COLONOSCOPY WITH PROPOFOL;  Surgeon: Toledo, Benay Pike, MD;  Location: [facility] ENDOSCOPY;  Service: Gastroenterology;  Laterality: N/A;   COSMETIC SURGERY     rhinoplasty   ESOPHAGOGASTRODUODENOSCOPY  03/30/2005   PTOSIS REPAIR Bilateral 07/09/2015   Procedure: PTOSIS REPAIR;  Surgeon: Karle Starch, MD;  Location: [facility];  Service: Ophthalmology;  Laterality: Bilateral;   rhinoplasty with septoplasty     TONSILLECTOMY AND ADENOIDECTOMY     TUBAL LIGATION     VAGINAL HYSTERECTOMY  1997   menorrhagia ovaries intact   Family History  Problem Relation Age of Onset   Cancer Mother        Lung, Bladder   Stroke Father 66       TIAs   Heart disease Father 3       AMI x 2.   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  Tobacco Use   Smoking status: Former    Packs/day: 1.00    Years: 17.00    Pack years: 17.00    Types: Cigarettes    Quit date: 05/22/1988    Years since quitting: 32.9   Smokeless tobacco: Never   Tobacco comments:    Quit 22 years ago, 1990  Vaping Use   Vaping Use: Never used  Substance and Sexual Activity   Alcohol  use: Yes    Alcohol/week: 6.0 standard drinks    Types: 6 Standard drinks or equivalent per week    Comment: moderate white wine 5 glasses per week   Drug use: No   Sexual activity: Yes    Partners: Male    Birth control/protection: Post-menopausal, Surgical  Other Topics Concern   Not on file  Social History Narrative      Marital status:  Married x 40 years, happily married; no domestic abuse.      Children:  1 child; 3 grandchildren local.      Employment:  Retired in 09/2012; [place] x 21 years; happy.  Pre-admission testing.      Tobacco:  Previous smoker. Quit 1990      Alcohol:  2 glasses of wine three nights per week.      Drugs; none      Exercise:  Treadmill, elliptical, Zumba.  [employer] 3x per week.      Seatbelt:  Always uses seat belts.       +Smoke alarm and carbon monoxide detector in the home.       Guns:  Guns stored in locked cabinet.       Caffeine use: Coffee, Tea, Carbonated beverages, 3 servings / day.                 Social Determinants of Health   Financial Resource Strain: Low Risk    Difficulty of Paying Living Expenses: Not hard at all  Food Insecurity: No Food Insecurity   Worried About Charity fundraiser in the Last Year: Never true   [place] in the Last Year: Never true  Transportation  Needs: No Transportation Needs   Lack of Transportation (Medical): No   Lack of Transportation (Non-Medical): No  Physical Activity: Insufficiently Active   Days of Exercise per Week: 1 day   Minutes of Exercise per Session: 20 min  Stress: No Stress Concern Present   Feeling of Stress : Not at all  Social Connections: Moderately Integrated   Frequency of Communication with Friends and Family: More than three times a week   Frequency of Social Gatherings with Friends and Family: More than three times a week   Attends Religious Services: Never   Marine scientist or Organizations: Yes   Attends Music therapist: More than 4 times per year   Marital Status: Married    Tobacco Counseling Counseling given: Not Answered Tobacco comments: Quit 22 years ago, 1990   Clinical Intake:  Pre-visit preparation completed: Yes  Pain : No/denies pain     BMI - recorded: 29.63 Nutritional Status: BMI 25 -29 Overweight Nutritional Risks: None Diabetes: No  How often do you need to have someone help you when you read instructions, pamphlets, or other written materials from your doctor or pharmacy?: 1 - Never  Diabetic? No  Interpreter Needed?: No  Information entered by :: Orrin Brigham LPN   Activities of Daily Living In your present state of health, do you have any difficulty performing the following activities: 04/18/2021  Hearing? Y  Comment wears hearing aids  Vision? Y  Difficulty concentrating or making decisions? N  Walking or climbing stairs? N  Dressing or bathing? N  Doing errands, shopping? N  Preparing Food and eating ? N  Using the Toilet? N  In the past six months, have you accidently leaked urine? Y  Comment wears pad  Do you have problems with loss of bowel  control? N  Managing your Medications? N  Managing your Finances? N  Housekeeping or managing your Housekeeping? N  Some recent data might be hidden    Patient Care Team: Jinny Sanders, MD as PCP - General (Family Medicine) Defrancesco, Alanda Slim, MD (Obstetrics and Gynecology)  Indicate any recent Medical Services you may have received from other than [facility] providers in the past year (date may be approximate).     Assessment:   This is a routine wellness examination for [place].  Hearing/Vision screen Hearing Screening - Comments:: Wears hearing aids Vision Screening - Comments:: Last exam 06/2020, Dr. Wallace Going   Dietary issues and exercise activities discussed: Current Exercise Habits: Home exercise routine, Type of exercise: strength training/weights;stretching, Time (Minutes): 20, Frequency (Times/Week): 1, Weekly Exercise (Minutes/Week): 20, Intensity: Mild   Goals Addressed             This Visit's Progress    Patient Stated       Would like to lose 20 pounds and work out more.       Depression Screen PHQ 2/9 Scores 04/18/2021 03/07/2019 01/28/2018 01/26/2017 08/06/2016 01/14/2016 01/31/2014  PHQ - 2 Score 0 0 0 0 1 0 0  PHQ- 9 Score - 0 - - 6 - -    Fall Risk Fall Risk  04/18/2021 04/05/2020 03/07/2019 01/28/2018 01/26/2017  Falls in the past year? 1 0 0 Yes Yes  Number falls in past yr: 0 0 0 1 2 or more  Injury with Fall? 0 - 0 No No  Comment just bruised - - - -  Risk for fall due to : Other (Comment) - Medication side effect - -  Risk for fall due to: Comment lost balance from leaning too far - - - -  Follow up Falls prevention discussed Falls evaluation completed Falls evaluation completed;Falls prevention discussed - -    FALL RISK PREVENTION PERTAINING TO THE HOME:  Any stairs in or around the home? Yes  If so, are there any without handrails? Yes  Home free of loose throw rugs in walkways, pet beds, electrical cords, etc? Yes  Adequate lighting in your home to reduce risk of falls? Yes   ASSISTIVE DEVICES UTILIZED TO PREVENT FALLS:  Life alert? No  Use of a cane, walker or w/c? No  Grab bars in the bathroom? Yes  Shower chair  or bench in shower? Yes  Elevated toilet seat or a handicapped toilet? Yes   TIMED UP AND GO:  Was the test performed? No .    Cognitive Function: Normal cognitive status assessed by this Nurse Health Advisor. No abnormalities found.   MMSE - Mini Mental State Exam 03/07/2019  Orientation to time 5  Orientation to Place 5  Registration 3  Attention/ Calculation 5  Recall 3  Language- repeat 1     6CIT Screen 04/18/2021  What Year? 0 points  What month? 0 points  What time? 0 points  Count back from 20 0 points  Months in reverse 0 points  Repeat phrase 0 points  Total Score 0    Immunizations Immunization History  Administered Date(s) Administered   Fluad Quad(high Dose 65+) 01/31/2019   Influenza Split 01/19/2011   Influenza, High Dose Seasonal PF 02/10/2021   Influenza,inj,Quad PF,6+ Mos 02/17/2013, 01/14/2016, 01/26/2017, 01/28/2018   Influenza-Unspecified 02/14/2014, 02/01/2015, 02/02/2020   PFIZER(Purple Top)SARS-COV-2 Vaccination 05/26/2019, 06/21/2019   Pneumococcal Conjugate-13 07/02/2015   Pneumococcal Polysaccharide-23 01/26/2017   Tdap 04/20/2008   Zoster, Live 04/17/2013  TDAP status: Due, Education has been provided regarding the importance of this vaccine. Advised may receive this vaccine at local pharmacy or Health Dept. Aware to provide a copy of the vaccination record if obtained from local pharmacy or Health Dept. Verbalized acceptance and understanding.  Flu Vaccine status: Up to date  Pneumococcal vaccine status: Up to date  Covid-19 vaccine status: Information provided on how to obtain vaccines.   Qualifies for Shingles Vaccine? Yes   Zostavax completed Yes   Shingrix Completed?: No.    Education has been provided regarding the importance of this vaccine. Patient has been advised to call insurance company to determine out of pocket expense if they have not yet received this vaccine. Advised may also receive vaccine at local pharmacy or  Health Dept. Verbalized acceptance and understanding.  Screening Tests Health Maintenance  Topic Date Due   Zoster Vaccines- Shingrix (1 of 2) Never done   TETANUS/TDAP  04/20/2018   COVID-19 Vaccine (3 - Booster for Pfizer series) 08/16/2019   MAMMOGRAM  12/20/2022   COLONOSCOPY (Pts 45-24yrs Insurance coverage will need to be confirmed)  09/25/2025   Pneumonia Vaccine 54+ Years old  Completed   INFLUENZA VACCINE  Completed   DEXA SCAN  Completed   Hepatitis C Screening  Completed   HPV VACCINES  Aged Out    Health Maintenance  Health Maintenance Due  Topic Date Due   Zoster Vaccines- Shingrix (1 of 2) Never done   TETANUS/TDAP  04/20/2018   COVID-19 Vaccine (3 - Booster for Pfizer series) 08/16/2019    Colorectal cancer screening: Type of screening: Colonoscopy. Completed 09/25/20. Repeat every 5 years  Mammogram status: Completed 12/19/20. Repeat every year  Bone Density status: Completed 12/19/20. Results reflect: Bone density results: OSTEOPENIA. Repeat every 2 years.  Lung Cancer Screening: (Low Dose CT Chest recommended if Age 40-80 years, 30 pack-year currently smoking OR have quit w/in 15years.) does not qualify.    Additional Screening:  Hepatitis C Screening: does qualify; Completed 01/09/16  Vision Screening: Recommended annual ophthalmology exams for early detection of glaucoma and other disorders of the eye. Is the patient up to date with their annual eye exam?  Yes  Who is the provider or what is the name of the office in which the patient attends annual eye exams?  Dr. Wallace Going    Dental Screening: Recommended annual dental exams for proper oral hygiene  Community Resource Referral / Chronic Care Management: CRR required this visit?  No   CCM required this visit?  No      Plan:     I have personally reviewed and noted the following in the patients chart:   Medical and social history Use of alcohol, tobacco or illicit drugs  Current medications  and supplements including opioid prescriptions.  Functional ability and status Nutritional status Physical activity Advanced directives List of other physicians Hospitalizations, surgeries, and ER visits in previous 12 months Vitals Screenings to include cognitive, depression, and falls Referrals and appointments  In addition, I have reviewed and discussed with patient certain preventive protocols, quality metrics, and best practice recommendations. A written personalized care plan for preventive services as well as general preventive health recommendations were provided to patient.   Due to this being a telephonic visit, the after visit summary with patients personalized plan was offered to patient via mail or my-chart. Patient would like to access on my-chart.     Loma Messing, LPN   14/48/1856   Nurse Health Advisor  Nurse Notes: none

## 2021-04-18 ENCOUNTER — Other Ambulatory Visit: Payer: Medicare Other

## 2021-04-18 ENCOUNTER — Ambulatory Visit (INDEPENDENT_AMBULATORY_CARE_PROVIDER_SITE_OTHER): Payer: Medicare Other

## 2021-04-18 VITALS — Ht 62.0 in | Wt 162.0 lb

## 2021-04-18 DIAGNOSIS — Z0000 Encounter for general adult medical examination without abnormal findings: Secondary | ICD-10-CM

## 2021-04-18 NOTE — Patient Instructions (Addendum)
Gina Harper , Thank you for taking time to complete your Medicare Wellness Visit. I appreciate your ongoing commitment to your health goals. Please review the following plan we discussed and let me know if I can assist you in the future.   Screening recommendations/referrals: Colonoscopy: up to date , completed 09/25/20, due 09/25/25 Mammogram: up to date , 12/19/20, due 12/19/21 Bone Density: up to date, completed 12/19/20, due 12/20/22 Recommended yearly ophthalmology/optometry visit for glaucoma screening and checkup Recommended yearly dental visit for hygiene and checkup  Vaccinations: Influenza vaccine: up to date Pneumococcal vaccine: up to date Tdap vaccine: Due- last completed 04/20/08, May obtain vaccine at your local pharmacy. Shingles vaccine: Discuss with your local pharmacy   Covid-19:newest booster available at your local pharmacy  Advanced directives: Please bring a copy of Living Will and/or [place] for your chart.   Conditions/risks identified: see problem list  Next appointment: Follow up in one year for your annual wellness visit 04/22/22 @ 9:45am, this will be a telephone visit.    Preventive Care 35 Years and Older, Female Preventive care refers to lifestyle choices and visits with your health care provider that can promote health and wellness. What does preventive care include? A yearly physical exam. This is also called an annual well check. Dental exams once or twice a year. Routine eye exams. Ask your health care provider how often you should have your eyes checked. Personal lifestyle choices, including: Daily care of your teeth and gums. Regular physical activity. Eating a healthy diet. Avoiding tobacco and drug use. Limiting alcohol use. Practicing safe sex. Taking low-dose aspirin every day. Taking vitamin and mineral supplements as recommended by your health care provider. What happens during an annual well check? The services and screenings  done by your health care provider during your annual well check will depend on your age, overall health, lifestyle risk factors, and family history of disease. Counseling  Your health care provider may ask you questions about your: Alcohol use. Tobacco use. Drug use. Emotional well-being. Home and relationship well-being. Sexual activity. Eating habits. History of falls. Memory and ability to understand (cognition). Work and work Statistician. Reproductive health. Screening  You may have the following tests or measurements: Height, weight, and BMI. Blood pressure. Lipid and cholesterol levels. These may be checked every 5 years, or more frequently if you are over 21 years old. Skin check. Lung cancer screening. You may have this screening every year starting at age 77 if you have a 30-pack-year history of smoking and currently smoke or have quit within the past 15 years. Fecal occult blood test (FOBT) of the stool. You may have this test every year starting at age 27. Flexible sigmoidoscopy or colonoscopy. You may have a sigmoidoscopy every 5 years or a colonoscopy every 10 years starting at age 10. Hepatitis C blood test. Hepatitis B blood test. Sexually transmitted disease (STD) testing. Diabetes screening. This is done by checking your blood sugar (glucose) after you have not eaten for a while (fasting). You may have this done every 1-3 years. Bone density scan. This is done to screen for osteoporosis. You may have this done starting at age 53. Mammogram. This may be done every 1-2 years. Talk to your health care provider about how often you should have regular mammograms. Talk with your health care provider about your test results, treatment options, and if necessary, the need for more tests. Vaccines  Your health care provider may recommend certain vaccines, such as:  Influenza vaccine. This is recommended every year. Tetanus, diphtheria, and acellular pertussis (Tdap, Td)  vaccine. You may need a Td booster every 10 years. Zoster vaccine. You may need this after age 63. Pneumococcal 13-valent conjugate (PCV13) vaccine. One dose is recommended after age 14. Pneumococcal polysaccharide (PPSV23) vaccine. One dose is recommended after age 15. Talk to your health care provider about which screenings and vaccines you need and how often you need them. This information is not intended to replace advice given to you by your health care provider. Make sure you discuss any questions you have with your health care provider. Document Released: 05/03/2015 Document Revised: 12/25/2015 Document Reviewed: 02/05/2015 Elsevier Interactive Patient Education  2017 [place] Prevention in the Home Falls can cause injuries. They can happen to people of all ages. There are many things you can do to make your home safe and to help prevent falls. What can I do on the outside of my home? Regularly fix the edges of walkways and driveways and fix any cracks. Remove anything that might make you trip as you walk through a door, such as a raised step or threshold. Trim any bushes or trees on the path to your home. Use bright outdoor lighting. Clear any walking paths of anything that might make someone trip, such as rocks or tools. Regularly check to see if handrails are loose or broken. Make sure that both sides of any steps have handrails. Any raised decks and porches should have guardrails on the edges. Have any leaves, snow, or ice cleared regularly. Use sand or salt on walking paths during winter. Clean up any spills in your garage right away. This includes oil or grease spills. What can I do in the bathroom? Use night lights. Install grab bars by the toilet and in the tub and shower. Do not use towel bars as grab bars. Use non-skid mats or decals in the tub or shower. If you need to sit down in the shower, use a plastic, non-slip stool. Keep the floor dry. Clean up any  water that spills on the floor as soon as it happens. Remove soap buildup in the tub or shower regularly. Attach bath mats securely with double-sided non-slip rug tape. Do not have throw rugs and other things on the floor that can make you trip. What can I do in the bedroom? Use night lights. Make sure that you have a light by your bed that is easy to reach. Do not use any sheets or blankets that are too big for your bed. They should not hang down onto the floor. Have a firm chair that has side arms. You can use this for support while you get dressed. Do not have throw rugs and other things on the floor that can make you trip. What can I do in the kitchen? Clean up any spills right away. Avoid walking on wet floors. Keep items that you use a lot in easy-to-reach places. If you need to reach something above you, use a strong step stool that has a grab bar. Keep electrical cords out of the way. Do not use floor polish or wax that makes floors slippery. If you must use wax, use non-skid floor wax. Do not have throw rugs and other things on the floor that can make you trip. What can I do with my stairs? Do not leave any items on the stairs. Make sure that there are handrails on both sides of the stairs and use them.  Fix handrails that are broken or loose. Make sure that handrails are as long as the stairways. Check any carpeting to make sure that it is firmly attached to the stairs. Fix any carpet that is loose or worn. Avoid having throw rugs at the top or bottom of the stairs. If you do have throw rugs, attach them to the floor with carpet tape. Make sure that you have a light switch at the top of the stairs and the bottom of the stairs. If you do not have them, ask someone to add them for you. What else can I do to help prevent falls? Wear shoes that: Do not have high heels. Have rubber bottoms. Are comfortable and fit you well. Are closed at the toe. Do not wear sandals. If you use a  stepladder: Make sure that it is fully opened. Do not climb a closed stepladder. Make sure that both sides of the stepladder are locked into place. Ask someone to hold it for you, if possible. Clearly mark and make sure that you can see: Any grab bars or handrails. First and last steps. Where the edge of each step is. Use tools that help you move around (mobility aids) if they are needed. These include: Canes. Walkers. Scooters. Crutches. Turn on the lights when you go into a dark area. Replace any light bulbs as soon as they burn out. Set up your furniture so you have a clear path. Avoid moving your furniture around. If any of your floors are uneven, fix them. If there are any pets around you, be aware of where they are. Review your medicines with your doctor. Some medicines can make you feel dizzy. This can increase your chance of falling. Ask your doctor what other things that you can do to help prevent falls. This information is not intended to replace advice given to you by your health care provider. Make sure you discuss any questions you have with your health care provider. Document Released: 01/31/2009 Document Revised: 09/12/2015 Document Reviewed: 05/11/2014 Elsevier Interactive Patient Education  2017 [employer].

## 2021-04-24 ENCOUNTER — Encounter: Payer: Self-pay | Admitting: Family Medicine

## 2021-04-24 ENCOUNTER — Other Ambulatory Visit: Payer: Self-pay

## 2021-04-24 ENCOUNTER — Ambulatory Visit (INDEPENDENT_AMBULATORY_CARE_PROVIDER_SITE_OTHER): Payer: Medicare Other | Admitting: Family Medicine

## 2021-04-24 VITALS — BP 120/72 | HR 63 | Temp 97.8°F | Ht 62.25 in | Wt 165.0 lb

## 2021-04-24 DIAGNOSIS — I10 Essential (primary) hypertension: Secondary | ICD-10-CM | POA: Diagnosis not present

## 2021-04-24 DIAGNOSIS — R7303 Prediabetes: Secondary | ICD-10-CM | POA: Diagnosis not present

## 2021-04-24 DIAGNOSIS — I700 Atherosclerosis of aorta: Secondary | ICD-10-CM

## 2021-04-24 DIAGNOSIS — F324 Major depressive disorder, single episode, in partial remission: Secondary | ICD-10-CM

## 2021-04-24 DIAGNOSIS — Z0000 Encounter for general adult medical examination without abnormal findings: Secondary | ICD-10-CM | POA: Diagnosis not present

## 2021-04-24 DIAGNOSIS — E7800 Pure hypercholesterolemia, unspecified: Secondary | ICD-10-CM | POA: Diagnosis not present

## 2021-04-24 DIAGNOSIS — E538 Deficiency of other specified B group vitamins: Secondary | ICD-10-CM | POA: Diagnosis not present

## 2021-04-24 LAB — COMPREHENSIVE METABOLIC PANEL
ALT: 24 U/L (ref 0–35)
AST: 24 U/L (ref 0–37)
Albumin: 4.5 g/dL (ref 3.5–5.2)
Alkaline Phosphatase: 72 U/L (ref 39–117)
BUN: 14 mg/dL (ref 6–23)
CO2: 31 mEq/L (ref 19–32)
Calcium: 9.3 mg/dL (ref 8.4–10.5)
Chloride: 104 mEq/L (ref 96–112)
Creatinine, Ser: 0.69 mg/dL (ref 0.40–1.20)
GFR: 87.39 mL/min (ref >60.00)
Glucose, Bld: 110 mg/dL — ABNORMAL HIGH (ref 70–99)
Potassium: 3.4 mEq/L — ABNORMAL LOW (ref 3.5–5.1)
Sodium: 141 mEq/L (ref 135–145)
Total Bilirubin: 0.5 mg/dL (ref 0.2–1.2)
Total Protein: 7.4 g/dL (ref 6.0–8.3)

## 2021-04-24 LAB — LIPID PANEL
Cholesterol: 129 mg/dL (ref 0–200)
HDL: 57 mg/dL (ref >39.00)
LDL Cholesterol: 54 mg/dL (ref 0–99)
NonHDL: 71.87
Total CHOL/HDL Ratio: 2
Triglycerides: 89 mg/dL (ref 0.0–149.0)
VLDL: 17.8 mg/dL (ref 0.0–40.0)

## 2021-04-24 LAB — VITAMIN B12: Vitamin B-12: 812 pg/mL (ref 211–911)

## 2021-04-24 LAB — HEMOGLOBIN A1C: Hgb A1c MFr Bld: 5.9 % (ref 4.6–6.5)

## 2021-04-24 MED ORDER — FLUOXETINE HCL 10 MG PO CAPS: 10.0000 mg | ORAL_CAPSULE | Freq: Every day | ORAL | 3 refills | Status: DC

## 2021-04-24 NOTE — Progress Notes (Signed)
Patient ID: Gina Harper, female    DOB: [birth year], 71 y.o.   MRN: [id number]  This visit was conducted in person.  BP 120/72    Pulse 63    Temp 97.8 F (36.6 C) (Temporal)    Ht 5' 2.25" (1.581 m)    Wt 165 lb (74.8 kg)    SpO2 96%    BMI 29.94 kg/m    CC:  Chief Complaint  Patient presents with   Annual Exam    CPX part 2    Subjective:   HPI: Gina Harper is a 71 y.o. female presenting on 04/24/2021 for Annual Exam (CPX part 2)  The patient presents for complete physical and review of chronic health problems. He/She also has the following acute concerns today:  The patient saw a LPN or RN for medicare wellness visit.  Prevention and wellness was reviewed in detail. Note reviewed and important notes copied below.   Screening recommendations/referrals: Colonoscopy: up to date , completed 09/25/20 Mammogram: up to date , 12/19/20, due 12/19/21 Bone Density: up to date, completed 12/19/20, due 12/20/22 Recommended yearly ophthalmology/optometry visit for glaucoma screening and checkup Recommended yearly dental visit for hygiene and checkup   Vaccinations: Influenza vaccine: up to date Pneumococcal vaccine: up to date Tdap vaccine: Due- last completed 04/20/08, May obtain vaccine at your local pharmacy. Shingles vaccine: Discuss with your local pharmacy   Covid-19:newest booster available at your local pharmacy    04/24/21  MDD:  [place] from 04/18/2021 in [place] at [facility] Total Score 0      Stable control on fluoxetine 20 mg daily  Elevated Cholesterol:  Due for re-eval. Using medications without problems: Muscle aches:  Diet compliance: moderate Exercise:  2 times a week. Other complaints:  Hypertension:    At goal on HCTZ 25 mg daily BP Readings from Last 3 Encounters:  04/24/21 120/72  04/08/21 120/80  03/26/21 140/72  Using medication without problems or lightheadedness:  none Chest pain with  exertion:none Edema: none Short of breath: none Average home BPs: Other issues:   Prediabetes  Due for re-eval      Relevant past medical, surgical, family and social history reviewed and updated as indicated. Interim medical history since our last visit reviewed. Allergies and medications reviewed and updated. Outpatient Medications Prior to Visit  Medication Sig Dispense Refill   albuterol (PROAIR HFA) 108 (90 Base) MCG/ACT inhaler Inhale 2 puffs into the lungs every 6 (six) hours as needed. 8 g 0   atorvastatin (LIPITOR) 10 MG tablet TAKE 1 TABLET BY MOUTH  DAILY 90 tablet 0   Biotin 10000 MCG TABS Take 1 tablet by mouth daily.     Calcium Carb-Cholecalciferol (CALCIUM CARBONATE-VITAMIN D3 PO) Take by mouth.     cetirizine (ZYRTEC) 10 MG tablet Take 10 mg by mouth as needed.      Cholecalciferol (VITAMIN D3) 2000 UNITS TABS Take by mouth daily.      docusate sodium (COLACE) 100 MG capsule Take 100 mg by mouth daily.     FLUoxetine (PROZAC) 20 MG capsule TAKE 1 CAPSULE BY MOUTH  DAILY 90 capsule 1   hydrochlorothiazide (HYDRODIURIL) 25 MG tablet TAKE 1 TABLET BY MOUTH  DAILY 90 tablet 1   Multiple Vitamin (MULTI-VITAMINS) TABS Take by mouth daily.     Potassium Gluconate 550 MG TABS Take by mouth daily.      promethazine (PHENERGAN) 25 MG suppository Place 1 suppository (25  mg total) rectally every 6 (six) hours as needed. 12 each 1   vitamin B-12 (CYANOCOBALAMIN) 1000 MCG tablet Take 1,000 mcg by mouth daily.     Benzonatate (TESSALON PERLES PO) Take 1 capsule by mouth every 8 (eight) hours as needed.     estradiol (ESTRACE VAGINAL) 0.1 MG/GM vaginal cream Place 1 Applicatorful vaginally daily as needed. 42.5 g 11   guaiFENesin (MUCINEX PO) Take 1 tablet by mouth in the morning and at bedtime.     No facility-administered medications prior to visit.     Per HPI unless specifically indicated in ROS section below Review of Systems Objective:  BP 120/72    Pulse 63    Temp 97.8  F (36.6 C) (Temporal)    Ht 5' 2.25" (1.581 m)    Wt 165 lb (74.8 kg)    SpO2 96%    BMI 29.94 kg/m   Wt Readings from Last 3 Encounters:  04/24/21 165 lb (74.8 kg)  04/18/21 162 lb (73.5 kg)  04/08/21 167 lb 6 oz (75.9 kg)      Physical Exam    Results for orders placed or performed in visit on 03/26/21  POCT Influenza A/B  Result Value Ref Range   Influenza A, POC Negative Negative   Influenza B, POC Negative Negative    This visit occurred during the SARS-CoV-2 public health emergency.  Safety protocols were in place, including screening questions prior to the visit, additional usage of staff PPE, and extensive cleaning of exam room while observing appropriate contact time as indicated for disinfecting solutions.   COVID 19 screen:  No recent travel or known exposure to COVID19 The patient denies respiratory symptoms of COVID 19 at this time. The importance of social distancing was discussed today.   Assessment and Plan The patient's preventative maintenance and recommended screening tests for an annual wellness exam were reviewed in full today. Brought up to date unless services declined.  Counselled on the importance of diet, exercise, and its role in overall health and mortality. The patient's FH and SH was reviewed, including their home life, tobacco status, and drug and alcohol status.   PAP not indicated given partial hysterectomy, no family history of ovarian cancer. Mammogram 12/2020 nml  Colonoscopy 09/2020 Dr. Alice Reichert,  no further indicated.  Flu vaccine, pneumonia uptodate, COVID vaccine x 3,  rx given for shingrix vaccine in 2021  Hep c neg  DEXA: 2017  Osteopenia,  improved  osteopenia on 12/19/2020   Problem List Items Addressed This Visit     Aortic atherosclerosis (HCC) (Chronic)    LDL goal < 70 on atorvastatin      B12 deficiency (Chronic)    Due for re-eval.      Essential hypertension, benign (Chronic)    Stable, chronic.  Continue current  medication.   HCTZ 25 mg daily      Major depressive disorder, single episode (Chronic)    Stable, chronic.   Can try to wean off of fluoxetine... sent in 10 mg capsules.   Fluoxetine 10 mg daily      Relevant Medications   FLUoxetine (PROZAC) 10 MG capsule   Prediabetes (Chronic)    Due for re-eval      Pure hypercholesterolemia (Chronic)    Due for re-eval.      Routine general medical examination at a health care facility - Primary    Eliezer Lofts, MD

## 2021-04-24 NOTE — Assessment & Plan Note (Signed)
LDL goal < 70 on atorvastatin  

## 2021-04-24 NOTE — Patient Instructions (Addendum)
Please stop at the lab to have labs drawn.  Can try to wean off of fluoxetine... sent in 10 mg capsules.

## 2021-04-24 NOTE — Assessment & Plan Note (Signed)
Due for re-eval. 

## 2021-04-24 NOTE — Assessment & Plan Note (Signed)
Stable, chronic.  Continue current medication.  HCTZ 25 mg daily 

## 2021-04-24 NOTE — Assessment & Plan Note (Addendum)
Stable, chronic.   Can try to wean off of fluoxetine... sent in 10 mg capsules.   Fluoxetine 10 mg daily

## 2021-04-24 NOTE — Progress Notes (Signed)
No critical labs need to be addressed urgently. We will discuss labs in detail at upcoming office visit.   

## 2021-05-18 ENCOUNTER — Other Ambulatory Visit: Payer: Self-pay | Admitting: Family Medicine

## 2021-06-03 ENCOUNTER — Other Ambulatory Visit: Payer: Self-pay | Admitting: Family Medicine

## 2021-07-15 DIAGNOSIS — H2513 Age-related nuclear cataract, bilateral: Secondary | ICD-10-CM | POA: Diagnosis not present

## 2021-07-24 ENCOUNTER — Encounter: Payer: Self-pay | Admitting: Family Medicine

## 2021-07-28 ENCOUNTER — Ambulatory Visit (INDEPENDENT_AMBULATORY_CARE_PROVIDER_SITE_OTHER): Payer: Medicare Other | Admitting: Family

## 2021-07-28 ENCOUNTER — Encounter: Payer: Self-pay | Admitting: Family

## 2021-07-28 VITALS — BP 118/72 | HR 62 | Temp 99.1°F | Ht 62.25 in | Wt 168.3 lb

## 2021-07-28 DIAGNOSIS — N3000 Acute cystitis without hematuria: Secondary | ICD-10-CM | POA: Insufficient documentation

## 2021-07-28 DIAGNOSIS — R300 Dysuria: Secondary | ICD-10-CM | POA: Diagnosis not present

## 2021-07-28 LAB — POC URINALSYSI DIPSTICK (AUTOMATED)
Bilirubin, UA: NEGATIVE
Blood, UA: NEGATIVE
Glucose, UA: NEGATIVE
Ketones, UA: NEGATIVE
Nitrite, UA: NEGATIVE
Protein, UA: POSITIVE — AB
Spec Grav, UA: 1.015 (ref 1.010–1.025)
Urobilinogen, UA: NEGATIVE E.U./dL — AB
pH, UA: 6.5 (ref 5.0–8.0)

## 2021-07-28 MED ORDER — CIPROFLOXACIN HCL 500 MG PO TABS: 500.0000 mg | ORAL_TABLET | Freq: Two times a day (BID) | ORAL | 0 refills | Status: AC

## 2021-07-28 NOTE — Assessment & Plan Note (Signed)
rx cipro 500 mg  ?antbx sent to pharmacy, pt to take as directed. Encouraged increased water intake throughout the day. Urine culture/reflex pending results. Choosing to treat due to being symptomatic. If no improvement in the next 2 days pt advised to let me know. ? ?

## 2021-07-28 NOTE — Assessment & Plan Note (Signed)
poct urine dipstick ordered pending results ?Urine culture ordered and pending  ?

## 2021-07-28 NOTE — Progress Notes (Signed)
? ?Established Patient Office Visit ? ?Subjective:  ?Patient ID: Gina Harper, female    DOB: 19-May-1950  Age: 71 y.o. MRN: [id number] ? ?CC:  ?Chief Complaint  ?Patient presents with  ? Urinary Frequency  ?  X 10 days ago  ? ? ?HPI ?Gina Harper is here today with concerns.  ? ?7-10 days with urinary urgency, frequency and when she sits down to pee she feels a sensation down and up her legs. She also feels stinging down her arms when she stings, almost like a chill from dysuria.  ? ?Denies fever or chills.  ?No flank pain.  ?Has not noticed blood in urine.  ?Did take azo x 2 days with mild relief however back not that she d/c. ? ?Past Medical History:  ?Diagnosis Date  ? 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 rectal spasm,syncope, nausea, ongoing for years  ? Allergic rhinitis, cause unspecified   ? Allergy   ? Alopecia, unspecified   ? Anemia   ? pernicious anemia  ? Arthritis   ? hands, knees, hips  ? Asthma   ? Cancer [facility])   ? melanoma  ? Chicken pox   ? Depressive disorder, not elsewhere classified   ? Disorder of bone and cartilage, unspecified   ? Esophageal reflux   ? GERD (gastroesophageal reflux disease)   ? Hepatitis   ? History of shingles   ? Insomnia, unspecified   ? Measles   ? red measles  ? Motion sickness   ? Mumps   ? Other abnormal glucose   ? Palpitations   ? Syncope, vasovagal   ? Tobacco use disorder   ? Unspecified asthma(493.90)   ? exercise induced. Never hospitalized  ? Unspecified essential hypertension   ? Unspecified hearing loss   ? Unspecified pruritic disorder   ? ? ?Past Surgical History:  ?Procedure Laterality Date  ? bladder tact  1995  ? BROW LIFT Bilateral 07/09/2015  ? Procedure: BLEPHAROPLASTY;  Surgeon: Karle Starch, MD;  Location: [place];  Service: Ophthalmology;  Laterality: Bilateral;  ? BUNIONECTOMY  [id number]  ? bilateral  ? colonoscopy  04/20/2004  ? no polyps; normal.  Repeat in 10 years.  [place].  ? COLONOSCOPY WITH PROPOFOL N/A 05/20/2015  ? Procedure:  COLONOSCOPY WITH PROPOFOL;  Surgeon: Lollie Sails, MD;  Location: [facility] ENDOSCOPY;  Service: Endoscopy;  Laterality: N/A;  ? COLONOSCOPY WITH PROPOFOL N/A 09/25/2020  ? Procedure: COLONOSCOPY WITH PROPOFOL;  Surgeon: Toledo, Benay Pike, MD;  Location: [facility] ENDOSCOPY;  Service: Gastroenterology;  Laterality: N/A;  ? COSMETIC SURGERY    ? rhinoplasty  ? ESOPHAGOGASTRODUODENOSCOPY  03/30/2005  ? PTOSIS REPAIR Bilateral 07/09/2015  ? Procedure: PTOSIS REPAIR;  Surgeon: Karle Starch, MD;  Location: [place];  Service: Ophthalmology;  Laterality: Bilateral;  ? rhinoplasty with septoplasty    ? TONSILLECTOMY AND ADENOIDECTOMY    ? TUBAL LIGATION    ? VAGINAL HYSTERECTOMY  1997  ? menorrhagia ovaries intact  ? ? ?Family History  ?Problem Relation Age of Onset  ? Cancer Mother   ?     Lung, Bladder  ? Stroke Father 61  ?     TIAs  ? Heart disease Father 104  ?     AMI x 2.  ? Colon polyps Father   ? Thyroid disease Sister   ? Cancer Brother   ?     lung  ? Breast cancer Maternal Aunt 60  ? Breast cancer Maternal Aunt   ? ? ?  Social History  ? ?Socioeconomic History  ? Marital status: Married  ?  Spouse name: Not on file  ? Number of children: 1  ? Years of education: college  ? Highest education level: Not on file  ?Occupational History  ? Occupation: Nurse  ?  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  ?Tobacco Use  ? Smoking status: Former  ?  Packs/day: 1.00  ?  Years: 17.00  ?  Pack years: 17.00  ?  Types: Cigarettes  ?  Quit date: 05/22/1988  ?  Years since quitting: 33.2  ? Smokeless tobacco: Never  ? Tobacco comments:  ?  Quit 22 years ago, 1990  ?Vaping Use  ? Vaping Use: Never used  ?Substance and Sexual Activity  ? Alcohol use: Yes  ?  Alcohol/week: 6.0 standard drinks  ?  Types: 6 Standard drinks or equivalent per week  ?  Comment: moderate white wine 5 glasses per week  ? Drug use: No  ? Sexual activity: Yes  ?  Partners: Male  ?  Birth control/protection: Post-menopausal, Surgical  ?Other Topics Concern  ? Not  on file  ?Social History Narrative  ?    Marital status:  Married x 40 years, happily married; no domestic abuse.  ?    Children:  1 child; 3 grandchildren local.  ?    Employment:  Retired in 09/2012; [place] x 21 years; happy.  Pre-admission testing.  ?    Tobacco:  Previous smoker. Quit 1990  ?    Alcohol:  2 glasses of wine three nights per week.  ?    Drugs; none  ?    Exercise:  Treadmill, elliptical, Zumba.  [employer] 3x per week.  ?    Seatbelt:  Always uses seat belts.   ?    +Smoke alarm and carbon monoxide detector in the home.   ?    Guns:  Guns stored in locked cabinet.   ?    Caffeine use: Coffee, Tea, Carbonated beverages, 3 servings / day.   ?       ?   ?   ? ?Social Determinants of Health  ? ?Financial Resource Strain: Low Risk   ? Difficulty of Paying Living Expenses: Not hard at all  ?Food Insecurity: No Food Insecurity  ? Worried About Charity fundraiser in the Last Year: Never true  ? Ran Out of Food in the Last Year: Never true  ?Transportation Needs: No Transportation Needs  ? Lack of Transportation (Medical): No  ? Lack of Transportation (Non-Medical): No  ?Physical Activity: Insufficiently Active  ? Days of Exercise per Week: 1 day  ? Minutes of Exercise per Session: 20 min  ?Stress: No Stress Concern Present  ? Feeling of Stress : Not at all  ?Social Connections: Moderately Integrated  ? Frequency of Communication with Friends and Family: More than three times a week  ? Frequency of Social Gatherings with Friends and Family: More than three times a week  ? Attends Religious Services: Never  ? Active Member of Clubs or Organizations: Yes  ? Attends Archivist Meetings: More than 4 times per year  ? Marital Status: Married  ?Intimate Partner Violence: Not At Risk  ? Fear of Current or Ex-Partner: No  ? Emotionally Abused: No  ? Physically Abused: No  ? Sexually Abused: No  ? ? ?Outpatient Medications Prior to Visit  ?Medication Sig Dispense Refill  ? albuterol (PROAIR HFA) 108 (90 Base)  MCG/ACT inhaler  Inhale 2 puffs into the lungs every 6 (six) hours as needed. 8 g 0  ? atorvastatin (LIPITOR) 10 MG tablet TAKE 1 TABLET BY MOUTH DAILY 90 tablet 3  ? Biotin 10000 MCG TABS Take 1 tablet by mouth daily.    ? Calcium Carb-Cholecalciferol (CALCIUM CARBONATE-VITAMIN D3 PO) Take by mouth.    ? cetirizine (ZYRTEC) 10 MG tablet Take 10 mg by mouth as needed.     ? Cholecalciferol (VITAMIN D3) 2000 UNITS TABS Take by mouth daily.     ? docusate sodium (COLACE) 100 MG capsule Take 100 mg by mouth daily.    ? FLUoxetine (PROZAC) 10 MG capsule Take 1 capsule (10 mg total) by mouth daily. 90 capsule 3  ? FLUoxetine (PROZAC) 20 MG capsule     ? FLUZONE HIGH-DOSE QUADRIVALENT 0.7 ML SUSY     ? hydrochlorothiazide (HYDRODIURIL) 25 MG tablet TAKE 1 TABLET BY MOUTH  DAILY 90 tablet 3  ? Multiple Vitamin (MULTI-VITAMINS) TABS Take by mouth daily.    ? Potassium Gluconate 550 MG TABS Take by mouth daily.     ? promethazine (PHENERGAN) 25 MG suppository Place 1 suppository (25 mg total) rectally every 6 (six) hours as needed. 12 each 1  ? vitamin B-12 (CYANOCOBALAMIN) 1000 MCG tablet Take 1,000 mcg by mouth daily.    ? ?No facility-administered medications prior to visit.  ? ? ?Allergies  ?Allergen Reactions  ? Cefdinir Hives  ? Lisinopril Cough and Other (See Comments)  ? Meloxicam Swelling  ? Omnipen [Ampicillin] Other (See Comments)  ? Augmentin [Amoxicillin-Pot Clavulanate] Rash  ? Clarithromycin Rash  ? ? ?ROS ?Review of Systems  ?Constitutional:  Negative for chills and fever.  ?Gastrointestinal:  Negative for abdominal pain.  ?Genitourinary:  Positive for dysuria (with chills/pains down arms and legs when peeing), frequency and urgency. Negative for difficulty urinating, flank pain, hematuria, pelvic pain and vaginal discharge.  ?Musculoskeletal:  Negative for arthralgias.  ? ?  ?Objective:  ?  ?Physical Exam ?Constitutional:   ?   General: She is not in acute distress. ?   Appearance: Normal appearance. She is  normal weight. She is not ill-appearing, toxic-appearing or diaphoretic.  ?Pulmonary:  ?   Effort: Pulmonary effort is normal.  ?Abdominal:  ?   General: Abdomen is flat.  ?   Tenderness: There is no abdominal te

## 2021-07-31 ENCOUNTER — Encounter: Payer: Self-pay | Admitting: Family

## 2021-07-31 LAB — URINE CULTURE
MICRO NUMBER:: 13242200
SPECIMEN QUALITY:: ADEQUATE

## 2021-08-05 DIAGNOSIS — L814 Other melanin hyperpigmentation: Secondary | ICD-10-CM | POA: Diagnosis not present

## 2021-08-05 DIAGNOSIS — L570 Actinic keratosis: Secondary | ICD-10-CM | POA: Diagnosis not present

## 2021-08-05 DIAGNOSIS — L821 Other seborrheic keratosis: Secondary | ICD-10-CM | POA: Diagnosis not present

## 2021-08-05 DIAGNOSIS — L720 Epidermal cyst: Secondary | ICD-10-CM | POA: Diagnosis not present

## 2021-09-12 DIAGNOSIS — M5416 Radiculopathy, lumbar region: Secondary | ICD-10-CM | POA: Diagnosis not present

## 2021-09-19 DIAGNOSIS — M5459 Other low back pain: Secondary | ICD-10-CM | POA: Diagnosis not present

## 2021-09-19 DIAGNOSIS — M25551 Pain in right hip: Secondary | ICD-10-CM | POA: Diagnosis not present

## 2021-09-19 DIAGNOSIS — M25552 Pain in left hip: Secondary | ICD-10-CM | POA: Diagnosis not present

## 2021-09-26 DIAGNOSIS — M25552 Pain in left hip: Secondary | ICD-10-CM | POA: Diagnosis not present

## 2021-09-26 DIAGNOSIS — M25551 Pain in right hip: Secondary | ICD-10-CM | POA: Diagnosis not present

## 2021-09-26 DIAGNOSIS — M5459 Other low back pain: Secondary | ICD-10-CM | POA: Diagnosis not present

## 2021-09-30 DIAGNOSIS — M5459 Other low back pain: Secondary | ICD-10-CM | POA: Diagnosis not present

## 2021-09-30 DIAGNOSIS — M25551 Pain in right hip: Secondary | ICD-10-CM | POA: Diagnosis not present

## 2021-09-30 DIAGNOSIS — M25552 Pain in left hip: Secondary | ICD-10-CM | POA: Diagnosis not present

## 2021-10-22 DIAGNOSIS — D1801 Hemangioma of skin and subcutaneous tissue: Secondary | ICD-10-CM | POA: Diagnosis not present

## 2021-10-22 DIAGNOSIS — Z85820 Personal history of malignant melanoma of skin: Secondary | ICD-10-CM | POA: Diagnosis not present

## 2021-10-22 DIAGNOSIS — L738 Other specified follicular disorders: Secondary | ICD-10-CM | POA: Diagnosis not present

## 2021-10-22 DIAGNOSIS — L680 Hirsutism: Secondary | ICD-10-CM | POA: Diagnosis not present

## 2021-10-22 DIAGNOSIS — Z872 Personal history of diseases of the skin and subcutaneous tissue: Secondary | ICD-10-CM | POA: Diagnosis not present

## 2021-10-22 DIAGNOSIS — L851 Acquired keratosis [keratoderma] palmaris et plantaris: Secondary | ICD-10-CM | POA: Diagnosis not present

## 2021-10-22 DIAGNOSIS — Z86018 Personal history of other benign neoplasm: Secondary | ICD-10-CM | POA: Diagnosis not present

## 2021-10-22 DIAGNOSIS — L578 Other skin changes due to chronic exposure to nonionizing radiation: Secondary | ICD-10-CM | POA: Diagnosis not present

## 2021-10-22 DIAGNOSIS — L298 Other pruritus: Secondary | ICD-10-CM | POA: Diagnosis not present

## 2021-10-28 DIAGNOSIS — M5416 Radiculopathy, lumbar region: Secondary | ICD-10-CM | POA: Diagnosis not present

## 2021-10-31 DIAGNOSIS — M5416 Radiculopathy, lumbar region: Secondary | ICD-10-CM | POA: Diagnosis not present

## 2021-11-20 DIAGNOSIS — M791 Myalgia, unspecified site: Secondary | ICD-10-CM | POA: Diagnosis not present

## 2021-11-20 DIAGNOSIS — M7918 Myalgia, other site: Secondary | ICD-10-CM | POA: Insufficient documentation

## 2021-11-20 DIAGNOSIS — M5136 Other intervertebral disc degeneration, lumbar region: Secondary | ICD-10-CM | POA: Insufficient documentation

## 2021-11-21 ENCOUNTER — Other Ambulatory Visit: Payer: Self-pay | Admitting: Family Medicine

## 2021-11-21 DIAGNOSIS — Z1231 Encounter for screening mammogram for malignant neoplasm of breast: Secondary | ICD-10-CM

## 2021-12-23 ENCOUNTER — Ambulatory Visit
Admission: RE | Admit: 2021-12-23 | Discharge: 2021-12-23 | Disposition: A | Discharge status: Home / Self Care | Payer: Medicare Other | Source: Ambulatory Visit | Attending: Family Medicine | Admitting: Family Medicine

## 2021-12-23 DIAGNOSIS — Z1231 Encounter for screening mammogram for malignant neoplasm of breast: Secondary | ICD-10-CM | POA: Insufficient documentation

## 2022-02-25 ENCOUNTER — Other Ambulatory Visit: Payer: Self-pay | Admitting: Family Medicine

## 2022-03-24 ENCOUNTER — Ambulatory Visit: Payer: Medicare Other | Admitting: Family Medicine

## 2022-04-15 ENCOUNTER — Other Ambulatory Visit: Payer: Self-pay | Admitting: Family Medicine

## 2022-04-21 ENCOUNTER — Telehealth: Payer: Self-pay | Admitting: Family Medicine

## 2022-04-21 DIAGNOSIS — E7800 Pure hypercholesterolemia, unspecified: Secondary | ICD-10-CM

## 2022-04-21 DIAGNOSIS — E538 Deficiency of other specified B group vitamins: Secondary | ICD-10-CM

## 2022-04-21 DIAGNOSIS — R7303 Prediabetes: Secondary | ICD-10-CM

## 2022-04-21 NOTE — Telephone Encounter (Signed)
-----   Message from Velna Hatchet, RT sent at 04/06/2022 11:47 AM EST ----- Regarding: Thu 1/4 lab Patient is scheduled for cpx, please order future labs.  Thanks, Anda Kraft

## 2022-04-22 ENCOUNTER — Ambulatory Visit: Payer: Medicare Other | Admitting: Podiatry

## 2022-04-22 ENCOUNTER — Encounter: Payer: Self-pay | Admitting: Podiatry

## 2022-04-22 ENCOUNTER — Ambulatory Visit (INDEPENDENT_AMBULATORY_CARE_PROVIDER_SITE_OTHER): Payer: Medicare Other

## 2022-04-22 VITALS — BP 171/79 | HR 73

## 2022-04-22 DIAGNOSIS — M7741 Metatarsalgia, right foot: Secondary | ICD-10-CM

## 2022-04-22 DIAGNOSIS — M2042 Other hammer toe(s) (acquired), left foot: Secondary | ICD-10-CM | POA: Diagnosis not present

## 2022-04-22 DIAGNOSIS — M778 Other enthesopathies, not elsewhere classified: Secondary | ICD-10-CM

## 2022-04-22 DIAGNOSIS — M7742 Metatarsalgia, left foot: Secondary | ICD-10-CM

## 2022-04-22 DIAGNOSIS — M2041 Other hammer toe(s) (acquired), right foot: Secondary | ICD-10-CM | POA: Diagnosis not present

## 2022-04-22 DIAGNOSIS — L853 Xerosis cutis: Secondary | ICD-10-CM | POA: Diagnosis not present

## 2022-04-22 NOTE — Progress Notes (Signed)
Subjective:  Patient ID: Gina Harper, female    DOB: 12-15-1950,  MRN: [id number] HPI Chief Complaint  Patient presents with   Foot Pain    Plantar forefoot and toes bilateral - Hammertoe deformity x several years, pain forefoot about 1-2 months, noticing pressure when walking, aching a lot   New Patient (Initial Visit)    72 y.o. female presents with the above complaint.   ROS: Denies fever chills nausea vomit muscle aches pains calf pain back pain chest pain shortness of breath.  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[place])    rectal spasm,syncope, nausea, ongoing for years   Allergic rhinitis, cause unspecified    Allergy    Alopecia, unspecified    Anemia    pernicious anemia   Arthritis    hands, knees, hips   Asthma    Cancer ([place])    melanoma   Chicken pox    Depressive disorder, not elsewhere classified    Disorder of bone and cartilage, unspecified    Esophageal reflux    GERD (gastroesophageal reflux disease)    Hepatitis    History of shingles    Insomnia, unspecified    Measles    red measles   Motion sickness    Mumps    Other abnormal glucose    Palpitations    Syncope, vasovagal    Tobacco use disorder    Unspecified asthma(493.90)    exercise induced. Never hospitalized   Unspecified essential hypertension    Unspecified hearing loss    Unspecified pruritic disorder    Past Surgical History:  Procedure Laterality Date   bladder tact  1995   BROW LIFT Bilateral 07/09/2015   Procedure: BLEPHAROPLASTY;  Surgeon: Karle Starch, MD;  Location: [place];  Service: Ophthalmology;  Laterality: Bilateral;   BUNIONECTOMY  [id number]   bilateral   colonoscopy  04/20/2004   no polyps; normal.  Repeat in 10 years.  [place].   COLONOSCOPY WITH PROPOFOL N/A 05/20/2015   Procedure: COLONOSCOPY WITH PROPOFOL;  Surgeon: Lollie Sails, MD;  [facility] ENDOSCOPY;  Service: Endoscopy;  Laterality: N/A;   COLONOSCOPY WITH  PROPOFOL N/A 09/25/2020   Procedure: COLONOSCOPY WITH PROPOFOL;  Surgeon: Toledo, Benay Pike, MD;  Location: [facility] ENDOSCOPY;  Service: Gastroenterology;  Laterality: N/A;   COSMETIC SURGERY     rhinoplasty   ESOPHAGOGASTRODUODENOSCOPY  03/30/2005   PTOSIS REPAIR Bilateral 07/09/2015   Procedure: PTOSIS REPAIR;  Surgeon: Karle Starch, MD;  Location: [place];  Service: Ophthalmology;  Laterality: Bilateral;   rhinoplasty with septoplasty     TONSILLECTOMY AND ADENOIDECTOMY     TUBAL LIGATION     VAGINAL HYSTERECTOMY  1997   menorrhagia ovaries intact    Current Outpatient Medications:    albuterol (PROAIR HFA) 108 (90 Base) MCG/ACT inhaler, Inhale 2 puffs into the lungs every 6 (six) hours as needed., Disp: 8 g, Rfl: 0   atorvastatin (LIPITOR) 10 MG tablet, TAKE 1 TABLET BY MOUTH DAILY, Disp: 90 tablet, Rfl: 0   Biotin 10000 MCG TABS, Take 1 tablet by mouth daily., Disp: , Rfl:    cetirizine (ZYRTEC) 10 MG tablet, Take 10 mg by mouth as needed. , Disp: , Rfl:    Cholecalciferol (VITAMIN D3) 2000 UNITS TABS, Take by mouth daily. , Disp: , Rfl:    docusate sodium (COLACE) 100 MG capsule, Take 100 mg by mouth daily., Disp: , Rfl:    FLUoxetine (PROZAC) 10 MG capsule,  TAKE 1 CAPSULE BY MOUTH DAILY, Disp: 90 capsule, Rfl: 0   FLUZONE HIGH-DOSE QUADRIVALENT 0.7 ML SUSY, , Disp: , Rfl:    hydrochlorothiazide (HYDRODIURIL) 25 MG tablet, TAKE 1 TABLET BY MOUTH DAILY, Disp: 90 tablet, Rfl: 0   Multiple Vitamin (MULTI-VITAMINS) TABS, Take by mouth daily., Disp: , Rfl:    Potassium Gluconate 550 MG TABS, Take by mouth daily. , Disp: , Rfl:    promethazine (PHENERGAN) 25 MG suppository, Place 1 suppository (25 mg total) rectally every 6 (six) hours as needed., Disp: 12 each, Rfl: 1   vitamin B-12 (CYANOCOBALAMIN) 1000 MCG tablet, Take 1,000 mcg by mouth daily., Disp: , Rfl:   Allergies  Allergen Reactions   Cefdinir Hives   Lisinopril Cough and Other (See Comments)   Meloxicam Swelling    Omnipen [Ampicillin] Other (See Comments)   Augmentin [Amoxicillin-Pot Clavulanate] Rash   Clarithromycin Rash   Review of Systems Objective:   Vitals:   04/22/22 1038  BP: (!) 171/79  Pulse: 73    General: Well developed, nourished, in no acute distress, alert and oriented x3   Dermatological: Skin is warm, dry and supple bilateral. Nails x 10 are well maintained; remaining integument appears unremarkable at this time. There are no open sores, no preulcerative lesions, no rash or signs of infection present.  Dry xerotic skin particular around the heels and the digits.  Vascular: Dorsalis Pedis artery and Posterior Tibial artery pedal pulses are 2/4 bilateral with immedate capillary fill time. Pedal hair growth present. No varicosities and no lower extremity edema present bilateral.   Neruologic: Grossly intact via light touch bilateral. Vibratory intact via tuning fork bilateral. Protective threshold with Semmes Wienstein monofilament intact to all pedal sites bilateral. Patellar and Achilles deep tendon reflexes 2+ bilateral. No Babinski or clonus noted bilateral.   Musculoskeletal: No gross boney pedal deformities bilateral. No pain, crepitus, or limitation noted with foot and ankle range of motion bilateral. Muscular strength 5/5 in all groups tested bilateral.  Flexible hammertoe deformities early osteoarthritic changes particularly the fourth bilateral left greater than right and then second digit DIPJ right.  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  Uncomplicated nontender  Gait: Unassisted, Nonantalgic.    Radiographs:  Radiographs demonstrate osseously mature individual with generalized diffuse demineralization.  Bunion repairs to the first metatarsal with some osteoarthritic changes to the PIPJ's and DIPJ's of each digit.  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  Mallet toe deformity second digit bilateral foot.  No  foreign bodies no significant acute deformity.  Assessment & Plan:   Assessment: Flexible mallet toe and hammertoe deformities second and fourth toes right and left respectively.  Metatarsalgia bilateral.  Mild reactive hyperkeratosis plantar aspect forefoot bilateral.  Plantar fibroma right greater than left.  Pes planovalgus right greater than left.  Plan: Unguinal follow-up with her in the next few weeks for flexor tenotomy's to the second digit of the DIPJ right in the PIPJ of the fourth left.  We discussed appropriate insoles to help take pressure off the midfoot and forefoot we discussed possible orthotics.  She states that she has had orthotics in the past and has not been wearing them.  I recommended that she wear them.  Discussed the plantar fibromas and that they are nonsymptomatic at this point and that we would defer treatment.     Shahil Speegle T. [place], [place]

## 2022-04-23 ENCOUNTER — Other Ambulatory Visit (INDEPENDENT_AMBULATORY_CARE_PROVIDER_SITE_OTHER): Payer: Medicare Other

## 2022-04-23 DIAGNOSIS — E538 Deficiency of other specified B group vitamins: Secondary | ICD-10-CM

## 2022-04-23 DIAGNOSIS — R7303 Prediabetes: Secondary | ICD-10-CM

## 2022-04-23 DIAGNOSIS — E7800 Pure hypercholesterolemia, unspecified: Secondary | ICD-10-CM | POA: Diagnosis not present

## 2022-04-23 LAB — LIPID PANEL
Cholesterol: 125 mg/dL (ref 0–200)
HDL: 51.6 mg/dL (ref >39.00)
LDL Cholesterol: 58 mg/dL (ref 0–99)
NonHDL: 73.49
Total CHOL/HDL Ratio: 2
Triglycerides: 76 mg/dL (ref 0.0–149.0)
VLDL: 15.2 mg/dL (ref 0.0–40.0)

## 2022-04-23 LAB — COMPREHENSIVE METABOLIC PANEL
ALT: 40 U/L — ABNORMAL HIGH (ref 0–35)
AST: 28 U/L (ref 0–37)
Albumin: 4.4 g/dL (ref 3.5–5.2)
Alkaline Phosphatase: 72 U/L (ref 39–117)
BUN: 15 mg/dL (ref 6–23)
CO2: 31 mEq/L (ref 19–32)
Calcium: 9.5 mg/dL (ref 8.4–10.5)
Chloride: 104 mEq/L (ref 96–112)
Creatinine, Ser: 0.66 mg/dL (ref 0.40–1.20)
GFR: 87.71 mL/min (ref >60.00)
Glucose, Bld: 113 mg/dL — ABNORMAL HIGH (ref 70–99)
Potassium: 3.7 mEq/L (ref 3.5–5.1)
Sodium: 142 mEq/L (ref 135–145)
Total Bilirubin: 0.5 mg/dL (ref 0.2–1.2)
Total Protein: 6.6 g/dL (ref 6.0–8.3)

## 2022-04-23 LAB — VITAMIN B12: Vitamin B-12: 667 pg/mL (ref 211–911)

## 2022-04-23 LAB — HEMOGLOBIN A1C: Hgb A1c MFr Bld: 6 % (ref 4.6–6.5)

## 2022-04-24 ENCOUNTER — Ambulatory Visit (INDEPENDENT_AMBULATORY_CARE_PROVIDER_SITE_OTHER): Payer: Medicare Other

## 2022-04-24 VITALS — Ht 62.25 in | Wt 168.0 lb

## 2022-04-24 DIAGNOSIS — Z0000 Encounter for general adult medical examination without abnormal findings: Secondary | ICD-10-CM

## 2022-04-24 NOTE — Progress Notes (Signed)
No critical labs need to be addressed urgently. We will discuss labs in detail at upcoming office visit.   

## 2022-04-24 NOTE — Progress Notes (Signed)
Virtual Visit via Telephone Note  I connected with  Gina Harper on 04/24/22 at  3:00 PM EST by telephone and verified that I am speaking with the correct person using two identifiers.  Location: Patient: Home Provider: [facility] Persons participating in the virtual visit: [place]   I discussed the limitations, risks, security and privacy concerns of performing an evaluation and management service by telephone and the availability of in person appointments. The patient expressed understanding and agreed to proceed.  Interactive audio and video telecommunications were attempted between this nurse and patient, however failed, due to patient having technical difficulties OR patient did not have access to video capability.  We continued and completed visit with audio only.  Some vital signs may be absent or patient reported.   Sheral Flow, LPN  Subjective:   Gina Harper is a 72 y.o. female who presents for Medicare Annual (Subsequent) preventive examination.  Review of Systems     Cardiac Risk Factors include: advanced age (>58mn, >>71women);dyslipidemia;family history of premature cardiovascular disease;hypertension;obesity (BMI >30kg/m2)     Objective:    Today's Vitals   04/24/22 1503  Weight: 168 lb (76.2 kg)  Height: 5' 2.25" (1.581 m)  PainSc: 0-No pain   Body mass index is 30.48 kg/m.     04/24/2022    3:05 PM 04/18/2021    9:47 AM 09/25/2020    8:48 AM 03/07/2019    9:57 AM 02/25/2019    8:47 AM 07/09/2015    6:42 AM 05/20/2015    7:08 AM  Advanced Directives  Does Patient Have a Medical Advance Directive? Yes Yes Yes Yes Yes Yes Yes  Type of AParamedicof AHeberLiving will HMountain CityLiving will HDesert HillsLiving will HSeagravesLiving will HSabana GrandeLiving will    Does patient want to make changes to medical advance directive?   Yes (MAU/Ambulatory/Procedural Areas - Information given)       Copy of H[place]boroin Chart? No - copy requested   No - copy requested  No - copy requested     Current Medications (verified) Outpatient Encounter Medications as of 04/24/2022  Medication Sig   albuterol (PROAIR HFA) 108 (90 Base) MCG/ACT inhaler Inhale 2 puffs into the lungs every 6 (six) hours as needed.   atorvastatin (LIPITOR) 10 MG tablet TAKE 1 TABLET BY MOUTH DAILY   Biotin 10000 MCG TABS Take 1 tablet by mouth daily.   cetirizine (ZYRTEC) 10 MG tablet Take 10 mg by mouth as needed.    Cholecalciferol (VITAMIN D3) 2000 UNITS TABS Take by mouth daily.    docusate sodium (COLACE) 100 MG capsule Take 100 mg by mouth daily.   FLUoxetine (PROZAC) 10 MG capsule TAKE 1 CAPSULE BY MOUTH DAILY   FLUZONE HIGH-DOSE QUADRIVALENT 0.7 ML SUSY    hydrochlorothiazide (HYDRODIURIL) 25 MG tablet TAKE 1 TABLET BY MOUTH DAILY   Multiple Vitamin (MULTI-VITAMINS) TABS Take by mouth daily.   Potassium Gluconate 550 MG TABS Take by mouth daily.    promethazine (PHENERGAN) 25 MG suppository Place 1 suppository (25 mg total) rectally every 6 (six) hours as needed.   vitamin B-12 (CYANOCOBALAMIN) 1000 MCG tablet Take 1,000 mcg by mouth daily.   No facility-administered encounter medications on file as of 04/24/2022.    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[place])    rectal spasm,syncope, nausea, ongoing for years   Allergic rhinitis, cause unspecified    Allergy    Alopecia, unspecified    Anemia    pernicious anemia   Arthritis    hands, knees, hips   Asthma    Cancer ([place])    melanoma   Chicken pox    Depressive disorder, not elsewhere classified    Disorder of bone and cartilage, unspecified    Esophageal reflux    GERD (gastroesophageal reflux  disease)    Hepatitis    History of shingles    Insomnia, unspecified    Measles    red measles   Motion sickness    Mumps    Other abnormal glucose    Palpitations    Syncope, vasovagal    Tobacco use disorder    Unspecified asthma(493.90)    exercise induced. Never hospitalized   Unspecified essential hypertension    Unspecified hearing loss    Unspecified pruritic disorder    Past Surgical History:  Procedure Laterality Date   bladder tact  1995   BROW LIFT Bilateral 07/09/2015   Procedure: BLEPHAROPLASTY;  Surgeon: Karle Starch, MD;  Location: [facility];  Service: Ophthalmology;  Laterality: Bilateral;   BUNIONECTOMY  [id number]   bilateral   colonoscopy  04/20/2004   no polyps; normal.  Repeat in 10 years.  [place].   COLONOSCOPY WITH PROPOFOL N/A 05/20/2015   Procedure: COLONOSCOPY WITH PROPOFOL;  Surgeon: Lollie Sails, MD;  Location[facility] ENDOSCOPY;  Service: Endoscopy;  Laterality: N/A;   COLONOSCOPY WITH PROPOFOL N/A 09/25/2020   Procedure: COLONOSCOPY WITH PROPOFOL;  Surgeon: Toledo, Benay Pike, MD;  Location: [facility] ENDOSCOPY;  Service: Gastroenterology;  Laterality: N/A;   COSMETIC SURGERY     rhinoplasty   ESOPHAGOGASTRODUODENOSCOPY  03/30/2005   PTOSIS REPAIR Bilateral 07/09/2015   Procedure: PTOSIS REPAIR;  Surgeon: Karle Starch, MD;  Location: [place];  Service: Ophthalmology;  Laterality: Bilateral;   rhinoplasty with septoplasty     TONSILLECTOMY AND ADENOIDECTOMY     TUBAL LIGATION     VAGINAL HYSTERECTOMY  1997   menorrhagia ovaries intact   Family History  Problem Relation Age of Onset   Cancer Mother        Lung, Bladder   Stroke Father 62       TIAs   Heart disease Father 63       AMI x 2.   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  Tobacco Use   Smoking status: Former    Packs/day: 1.00    Years: 17.00    Total pack years: 17.00    Types: Cigarettes    Quit date: 05/22/1988    Years since quitting: 33.9   Smokeless tobacco: Never   Tobacco comments:    Quit 22 years ago, 1990  Vaping Use   Vaping Use: Never used  Substance and Sexual Activity   Alcohol use: Yes  Alcohol/week: 6.0 standard drinks of alcohol    Types: 6 Standard drinks or equivalent per week    Comment: moderate white wine 5 glasses per week   Drug use: No   Sexual activity: Yes    Partners: Male    Birth control/protection: Post-menopausal, Surgical  Other Topics Concern   Not on file  Social History Narrative      Marital status:  Married x 40 years, happily married; no domestic abuse.      Children:  1 child; 3 grandchildren local.      Employment:  Retired in 09/2012; [employer] x 21 years; happy.  Pre-admission testing.      Tobacco:  Previous smoker. Quit 1990      Alcohol:  2 glasses of wine three nights per week.      Drugs; none      Exercise:  Treadmill, elliptical, Zumba.  [employer] 3x per week.      Seatbelt:  Always uses seat belts.       +Smoke alarm and carbon monoxide detector in the home.       Guns:  Guns stored in locked cabinet.       Caffeine use: Coffee, Tea, Carbonated beverages, 3 servings / day.                 Social Determinants of Health   Financial Resource Strain: Low Risk  (04/24/2022)   Overall Financial Resource Strain (CARDIA)    Difficulty of Paying Living Expenses: Not hard at all  Food Insecurity: No Food Insecurity (04/24/2022)   Hunger Vital Sign    Worried About Running Out of Food in the Last Year: Never true    Ran Out of Food in the Last Year: Never true  Transportation Needs: No Transportation Needs (04/24/2022)   PRAPARE  - Hydrologist (Medical): No    Lack of Transportation (Non-Medical): No  Physical Activity: Sufficiently Active (04/24/2022)   Exercise Vital Sign    Days of Exercise per Week: 5 days    Minutes of Exercise per Session: 30 min  Stress: No Stress Concern Present (04/24/2022)   [place]    Feeling of Stress : Not at all  Social Connections: Moderately Integrated (04/24/2022)   Social Connection and Isolation Panel [NHANES]    Frequency of Communication with Friends and Family: More than three times a week    Frequency of Social Gatherings with Friends and Family: More than three times a week    Attends Religious Services: Never    Marine scientist or Organizations: Yes    Attends Music therapist: More than 4 times per year    Marital Status: Married    Tobacco Counseling Counseling given: Not Answered Tobacco comments: Quit 22 years ago, 1990   Clinical Intake:  Pre-visit preparation completed: Yes  Pain : No/denies pain Pain Score: 0-No pain     BMI - recorded: 30.48 Nutritional Status: BMI > 30  Obese Nutritional Risks: None Diabetes: No  How often do you need to have someone help you when you read instructions, pamphlets, or other written materials from your doctor or pharmacy?: 1 - Never 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 no  Interpreter Needed?: No  Information entered by :: [employer], LPN.   Activities of Daily Living    04/24/2022    3:10 PM  In your  present state of health, do you have any difficulty performing the following activities:  Hearing? 1  Vision? 0  Difficulty concentrating or making decisions? 0  Walking or climbing stairs? 0  Dressing or bathing? 0  Doing errands, shopping? 0  Preparing Food and eating ? N  Using the Toilet? N  In the past six months, have you accidently leaked  urine? Y  Comment wear protection  Do you have problems with loss of bowel control? Y  Comment wear protection  Managing your Medications? N  Managing your Finances? N  Housekeeping or managing your Housekeeping? N    Patient Care Team: Jinny Sanders, MD as PCP - General (Family Medicine) Defrancesco, Alanda Slim, MD (Obstetrics and Gynecology) Leandrew Koyanagi, MD as Referring Physician (Ophthalmology)  Indicate any recent Medical Services you may have received from other than [facility] providers in the past year (date may be approximate).     Assessment:   This is a routine wellness examination for [place].  Hearing/Vision screen Hearing Screening - Comments:: Patient has hearing aids. Vision Screening - Comments:: Wears rx glasses - up to date with routine eye exams with [facility]/Dr. Mali Brasington   Dietary issues and exercise activities discussed: Current Exercise Habits: Home exercise routine, Type of exercise: walking;treadmill;stretching;strength training/weights;exercise ball;calisthenics, Time (Minutes): 30, Frequency (Times/Week): 5, Weekly Exercise (Minutes/Week): 150, Intensity: Moderate   Goals Addressed             This Visit's Progress    My goal for 2024 is to lose some weight.  My weight goal is to be 132 pounds.        Depression Screen    04/24/2022    3:10 PM 04/24/2021    9:24 AM 04/18/2021    9:52 AM 03/07/2019    9:58 AM 01/28/2018    8:41 AM 01/26/2017    8:18 AM 08/06/2016   10:08 AM  PHQ 2/9 Scores  PHQ - 2 Score 0 0 0 0 0 0 1  PHQ- 9 Score  0  0   6    Fall Risk    04/24/2022    3:06 PM 04/18/2021    9:49 AM 04/05/2020    2:43 PM 03/07/2019    9:57 AM 01/28/2018    8:41 AM  Fall Risk   Falls in the past year? 1 1 0 0 Yes  Number falls in past yr: 1 0 0 0 1  Injury with Fall? 0 0  0 No  Comment  just bruised     Risk for fall due to :  Other (Comment)  Medication side effect   Risk for fall due to: Comment  lost balance from  leaning too far     Follow up Falls prevention discussed Falls prevention discussed Falls evaluation completed Falls evaluation completed;Falls prevention discussed     FALL RISK PREVENTION PERTAINING TO THE HOME:  Any stairs in or around the home? Yes  If so, are there any without handrails? No  Home free of loose throw rugs in walkways, pet beds, electrical cords, etc? Yes  Adequate lighting in your home to reduce risk of falls? Yes   ASSISTIVE DEVICES UTILIZED TO PREVENT FALLS:  Life alert? No  Use of a cane, walker or w/c? No  Grab bars in the bathroom? Yes  Shower chair or bench in shower? Yes  Elevated toilet seat or a handicapped toilet? Yes   TIMED UP AND GO:  Was the test performed? No .  Phone Visit  Cognitive Function:    03/07/2019   10:00 AM  MMSE - Mini Mental State Exam  Orientation to time 5  Orientation to Place 5  Registration 3  Attention/ Calculation 5  Recall 3  Language- repeat 1        04/24/2022    3:17 PM 04/18/2021    9:56 AM  6CIT Screen  What Year? 0 points 0 points  What month? 0 points 0 points  What time? 0 points 0 points  Count back from 20 0 points 0 points  Months in reverse 0 points 0 points  Repeat phrase 0 points 0 points  Total Score 0 points 0 points    Immunizations Immunization History  Administered Date(s) Administered   Fluad Quad(high Dose 65+) 01/31/2019   Influenza Split 01/19/2011   Influenza, High Dose Seasonal PF 02/10/2021   Influenza,inj,Quad PF,6+ Mos 02/17/2013, 01/14/2016, 01/26/2017, 01/28/2018   Influenza-Unspecified 02/14/2014, 02/01/2015, 02/02/2020, 02/22/2022   PFIZER(Purple Top)SARS-COV-2 Vaccination 05/26/2019, 06/21/2019, 04/29/2020   Pneumococcal Conjugate-13 07/02/2015   Pneumococcal Polysaccharide-23 01/26/2017   Tdap 04/20/2008   Zoster, Live 04/17/2013    TDAP status: Due, Education has been provided regarding the importance of this vaccine. Advised may receive this vaccine at local  pharmacy or Health Dept. Aware to provide a copy of the vaccination record if obtained from local pharmacy or Health Dept. Verbalized acceptance and understanding.  Flu Vaccine status: Up to date  Pneumococcal vaccine status: Up to date  Covid-19 vaccine status: Completed vaccines  Qualifies for Shingles Vaccine? Yes   Zostavax completed Yes   Shingrix Completed?: No.    Education has been provided regarding the importance of this vaccine. Patient has been advised to call insurance company to determine out of pocket expense if they have not yet received this vaccine. Advised may also receive vaccine at local pharmacy or Health Dept. Verbalized acceptance and understanding.  Screening Tests Health Maintenance  Topic Date Due   Zoster Vaccines- Shingrix (1 of 2) Never done   DTaP/Tdap/Td (2 - Td or Tdap) 04/20/2018   COVID-19 Vaccine (4 - 2023-24 season) 12/19/2021   MAMMOGRAM  12/24/2023   Pneumonia Vaccine 81+ Years old  Completed   INFLUENZA VACCINE  Completed   DEXA SCAN  Completed   Hepatitis C Screening  Completed   HPV VACCINES  Aged Out   COLONOSCOPY (Pts 45-72yr Insurance coverage will need to be confirmed)  DGresham ParkMaintenance Due  Topic Date Due   Zoster Vaccines- Shingrix (1 of 2) Never done   DTaP/Tdap/Td (2 - Td or Tdap) 04/20/2018   COVID-19 Vaccine (4 - 2023-24 season) 12/19/2021    Colorectal cancer screening: No longer required.   Mammogram status: Completed 12/23/2021. Repeat every year  Bone Density status: Completed 12/19/2020. Results reflect: Bone density results: OSTEOPENIA. Repeat every 2 years.  Lung Cancer Screening: (Low Dose CT Chest recommended if Age 73-80years, 30 pack-year currently smoking OR have quit w/in 15years.) does not qualify.   Lung Cancer Screening Referral: NO  Additional Screening:  Hepatitis C Screening: does qualify; Completed 01/26/2017  Vision Screening: Recommended annual ophthalmology  exams for early detection of glaucoma and other disorders of the eye. Is the patient up to date with their annual eye exam?  Yes  Who is the provider or what is the name of the office in which the patient attends annual eye exams? DR. CLeandrew KoyanagiIf pt is not established with a provider, would  they like to be referred to a provider to establish care? No .   Dental Screening: Recommended annual dental exams for proper oral hygiene  Community Resource Referral / Chronic Care Management: CRR required this visit?  No   CCM required this visit?  No      Plan:     I have personally reviewed and noted the following in the patient's chart:   Medical and social history Use of alcohol, tobacco or illicit drugs  Current medications and supplements including opioid prescriptions. Patient is not currently taking opioid prescriptions. Functional ability and status Nutritional status Physical activity Advanced directives List of other physicians Hospitalizations, surgeries, and ER visits in previous 12 months Vitals Screenings to include cognitive, depression, and falls Referrals and appointments  In addition, I have reviewed and discussed with patient certain preventive protocols, quality metrics, and best practice recommendations. A written personalized care plan for preventive services as well as general preventive health recommendations were provided to patient.     Sheral Flow, LPN   06/25/3336   Nurse Notes:  Patient is cogitatively intact. There were no vitals filed for this visit. Patient stated that she has no issues with gait or balance; does not use any assistive devices.

## 2022-04-24 NOTE — Patient Instructions (Signed)
Gina Harper , Thank you for taking time to come for your Medicare Wellness Visit. I appreciate your ongoing commitment to your health goals. Please review the following plan we discussed and let me know if I can assist you in the future.   These are the goals we discussed:  Goals      My goal for 2024 is to lose some weight.  My weight goal is to be 132 pounds.        This is a list of the screening recommended for you and due dates:  Health Maintenance  Topic Date Due   Zoster (Shingles) Vaccine (1 of 2) Never done   DTaP/Tdap/Td vaccine (2 - Td or Tdap) 04/20/2018   COVID-19 Vaccine (4 - 2023-24 season) 12/19/2021   Mammogram  12/24/2023   Pneumonia Vaccine  Completed   Flu Shot  Completed   DEXA scan (bone density measurement)  Completed   Hepatitis C Screening: USPSTF Recommendation to screen - Ages 8-79 yo.  Completed   HPV Vaccine  Aged Out   Colon Cancer Screening  Discontinued    Advanced directives: Yes  Conditions/risks identified: Yes  Next appointment: Follow up in one year for your annual wellness visit.   Preventive Care 55 Years and Older, Female Preventive care refers to lifestyle choices and visits with your health care provider that can promote health and wellness. What does preventive care include? A yearly physical exam. This is also called an annual well check. Dental exams once or twice a year. Routine eye exams. Ask your health care provider how often you should have your eyes checked. Personal lifestyle choices, including: Daily care of your teeth and gums. Regular physical activity. Eating a healthy diet. Avoiding tobacco and drug use. Limiting alcohol use. Practicing safe sex. Taking low-dose aspirin every day. Taking vitamin and mineral supplements as recommended by your health care provider. What happens during an annual well check? The services and screenings done by your health care provider during your annual well check will depend on  your age, overall health, lifestyle risk factors, and family history of disease. Counseling  Your health care provider may ask you questions about your: Alcohol use. Tobacco use. Drug use. Emotional well-being. Home and relationship well-being. Sexual activity. Eating habits. History of falls. Memory and ability to understand (cognition). Work and work Statistician. Reproductive health. Screening  You may have the following tests or measurements: Height, weight, and BMI. Blood pressure. Lipid and cholesterol levels. These may be checked every 5 years, or more frequently if you are over 69 years old. Skin check. Lung cancer screening. You may have this screening every year starting at age 64 if you have a 30-pack-year history of smoking and currently smoke or have quit within the past 15 years. Fecal occult blood test (FOBT) of the stool. You may have this test every year starting at age 34. Flexible sigmoidoscopy or colonoscopy. You may have a sigmoidoscopy every 5 years or a colonoscopy every 10 years starting at age 65. Hepatitis C blood test. Hepatitis B blood test. Sexually transmitted disease (STD) testing. Diabetes screening. This is done by checking your blood sugar (glucose) after you have not eaten for a while (fasting). You may have this done every 1-3 years. Bone density scan. This is done to screen for osteoporosis. You may have this done starting at age 60. Mammogram. This may be done every 1-2 years. Talk to your health care provider about how often you should have regular mammograms.  Talk with your health care provider about your test results, treatment options, and if necessary, the need for more tests. Vaccines  Your health care provider may recommend certain vaccines, such as: Influenza vaccine. This is recommended every year. Tetanus, diphtheria, and acellular pertussis (Tdap, Td) vaccine. You may need a Td booster every 10 years. Zoster vaccine. You may need this  after age 74. Pneumococcal 13-valent conjugate (PCV13) vaccine. One dose is recommended after age 72. Pneumococcal polysaccharide (PPSV23) vaccine. One dose is recommended after age 76. Talk to your health care provider about which screenings and vaccines you need and how often you need them. This information is not intended to replace advice given to you by your health care provider. Make sure you discuss any questions you have with your health care provider. Document Released: 05/03/2015 Document Revised: 12/25/2015 Document Reviewed: 02/05/2015 Elsevier Interactive Patient Education  2017 [place] Prevention in the Home Falls can cause injuries. They can happen to people of all ages. There are many things you can do to make your home safe and to help prevent falls. What can I do on the outside of my home? Regularly fix the edges of walkways and driveways and fix any cracks. Remove anything that might make you trip as you walk through a door, such as a raised step or threshold. Trim any bushes or trees on the path to your home. Use bright outdoor lighting. Clear any walking paths of anything that might make someone trip, such as rocks or tools. Regularly check to see if handrails are loose or broken. Make sure that both sides of any steps have handrails. Any raised decks and porches should have guardrails on the edges. Have any leaves, snow, or ice cleared regularly. Use sand or salt on walking paths during winter. Clean up any spills in your garage right away. This includes oil or grease spills. What can I do in the bathroom? Use night lights. Install grab bars by the toilet and in the tub and shower. Do not use towel bars as grab bars. Use non-skid mats or decals in the tub or shower. If you need to sit down in the shower, use a plastic, non-slip stool. Keep the floor dry. Clean up any water that spills on the floor as soon as it happens. Remove soap buildup in the tub or  shower regularly. Attach bath mats securely with double-sided non-slip rug tape. Do not have throw rugs and other things on the floor that can make you trip. What can I do in the bedroom? Use night lights. Make sure that you have a light by your bed that is easy to reach. Do not use any sheets or blankets that are too big for your bed. They should not hang down onto the floor. Have a firm chair that has side arms. You can use this for support while you get dressed. Do not have throw rugs and other things on the floor that can make you trip. What can I do in the kitchen? Clean up any spills right away. Avoid walking on wet floors. Keep items that you use a lot in easy-to-reach places. If you need to reach something above you, use a strong step stool that has a grab bar. Keep electrical cords out of the way. Do not use floor polish or wax that makes floors slippery. If you must use wax, use non-skid floor wax. Do not have throw rugs and other things on the floor that can make  you trip. What can I do with my stairs? Do not leave any items on the stairs. Make sure that there are handrails on both sides of the stairs and use them. Fix handrails that are broken or loose. Make sure that handrails are as long as the stairways. Check any carpeting to make sure that it is firmly attached to the stairs. Fix any carpet that is loose or worn. Avoid having throw rugs at the top or bottom of the stairs. If you do have throw rugs, attach them to the floor with carpet tape. Make sure that you have a light switch at the top of the stairs and the bottom of the stairs. If you do not have them, ask someone to add them for you. What else can I do to help prevent falls? Wear shoes that: Do not have high heels. Have rubber bottoms. Are comfortable and fit you well. Are closed at the toe. Do not wear sandals. If you use a stepladder: Make sure that it is fully opened. Do not climb a closed stepladder. Make  sure that both sides of the stepladder are locked into place. Ask someone to hold it for you, if possible. Clearly mark and make sure that you can see: Any grab bars or handrails. First and last steps. Where the edge of each step is. Use tools that help you move around (mobility aids) if they are needed. These include: Canes. Walkers. Scooters. Crutches. Turn on the lights when you go into a dark area. Replace any light bulbs as soon as they burn out. Set up your furniture so you have a clear path. Avoid moving your furniture around. If any of your floors are uneven, fix them. If there are any pets around you, be aware of where they are. Review your medicines with your doctor. Some medicines can make you feel dizzy. This can increase your chance of falling. Ask your doctor what other things that you can do to help prevent falls. This information is not intended to replace advice given to you by your health care provider. Make sure you discuss any questions you have with your health care provider. Document Released: 01/31/2009 Document Revised: 09/12/2015 Document Reviewed: 05/11/2014 Elsevier Interactive Patient Education  2017 [employer].

## 2022-04-28 ENCOUNTER — Encounter: Payer: Self-pay | Admitting: Family Medicine

## 2022-04-28 ENCOUNTER — Ambulatory Visit (INDEPENDENT_AMBULATORY_CARE_PROVIDER_SITE_OTHER): Payer: Medicare Other | Admitting: Family Medicine

## 2022-04-28 ENCOUNTER — Encounter: Payer: Self-pay | Admitting: *Deleted

## 2022-04-28 VITALS — BP 136/80 | HR 67 | Temp 98.1°F | Ht 62.5 in | Wt 170.2 lb

## 2022-04-28 DIAGNOSIS — I10 Essential (primary) hypertension: Secondary | ICD-10-CM

## 2022-04-28 DIAGNOSIS — I700 Atherosclerosis of aorta: Secondary | ICD-10-CM | POA: Diagnosis not present

## 2022-04-28 DIAGNOSIS — R7303 Prediabetes: Secondary | ICD-10-CM | POA: Diagnosis not present

## 2022-04-28 DIAGNOSIS — E538 Deficiency of other specified B group vitamins: Secondary | ICD-10-CM | POA: Diagnosis not present

## 2022-04-28 DIAGNOSIS — E041 Nontoxic single thyroid nodule: Secondary | ICD-10-CM | POA: Diagnosis not present

## 2022-04-28 DIAGNOSIS — Z0000 Encounter for general adult medical examination without abnormal findings: Secondary | ICD-10-CM

## 2022-04-28 DIAGNOSIS — R911 Solitary pulmonary nodule: Secondary | ICD-10-CM | POA: Diagnosis not present

## 2022-04-28 DIAGNOSIS — F321 Major depressive disorder, single episode, moderate: Secondary | ICD-10-CM

## 2022-04-28 DIAGNOSIS — N644 Mastodynia: Secondary | ICD-10-CM | POA: Insufficient documentation

## 2022-04-28 DIAGNOSIS — R0683 Snoring: Secondary | ICD-10-CM | POA: Insufficient documentation

## 2022-04-28 NOTE — Assessment & Plan Note (Signed)
Cut back on caffeine and alcohol.  Refer for imaging.  No abnormality noted.

## 2022-04-28 NOTE — Assessment & Plan Note (Signed)
B12 in the normal range on supplementation

## 2022-04-28 NOTE — Assessment & Plan Note (Signed)
Stable, chronic.  Continue current medication.  HCTZ 25 mg daily 

## 2022-04-28 NOTE — Assessment & Plan Note (Signed)
LDL goal < 70 on atorvastatin

## 2022-04-28 NOTE — Patient Instructions (Addendum)
We will work on referral for sleep apnea evaluation.  Decrease wine to 1 glass  or less a day.  Work on heart healthy diet and regular exercise.  Start with daily fiber supplement.. metamucil for stool leakage.. if not improving in 2-3 months make appt to discuss in detail and have rectal exam.  We will move forward with referral for evaluation of left breast pain.

## 2022-04-28 NOTE — Assessment & Plan Note (Signed)
Chest CT 2022: Stable right upper and right middle lobe sub 3 mm pulmonary nodules. Given greater than 12 month stability, no specific imaging follow-up is required.

## 2022-04-28 NOTE — Assessment & Plan Note (Signed)
Seen incidentally on CT Thyroid US 2022: Mildly heterogeneous thyroid gland with multiple bilateral thyroid nodules, none of which meet criteria for follow-up or fine-needle aspiration.

## 2022-04-28 NOTE — Assessment & Plan Note (Signed)
Very typical history for possible OSA. Refer for home sleep study and likely CPAP.

## 2022-04-28 NOTE — Assessment & Plan Note (Signed)
Chronic, stable control with diet

## 2022-04-28 NOTE — Progress Notes (Signed)
Patient ID: Gina Harper, female    DOB: 06-26-1950, 72 y.o.   MRN: [id number]  This visit was conducted in person.  BP 136/80   Pulse 67   Temp 98.1 F (36.7 C) (Oral)   Ht 5' 2.5" (1.588 m)   Wt 170 lb 4 oz (77.2 kg)   SpO2 96%   BMI 30.64 kg/m    CC:  Chief Complaint  Patient presents with   Annual Exam    Part 2    Subjective:   HPI: Gina Harper is a 72 y.o. female presenting on 04/28/2022 for Annual Exam (Part 2)  The patient presents for  complete physical and review of chronic health problems. He/She also has the following acute concerns today:   She is interested in sleep study.. reports she snores, does not rest well at night, frequently wake. Wake up not feeling rested. Occ AM headaches.    Pain in left breast.. nml mammogram in 12/2021.  No lumps noted.  The patient saw a LPN or RN for medicare wellness visit.  Prevention and wellness was reviewed in detail. Note reviewed and important notes copied below as needed.   Hypertension:  Well-controlled usually on HCTZ 25 mg daily ( was high given anxiety at podiatry on 04/22/2022  BP Readings from Last 3 Encounters:  04/28/22 136/80  04/22/22 (!) 171/79  07/28/21 118/72  Using medication without problems or lightheadedness: none Chest pain with exertion:none Edema:none Short of breath:none Average home BPs: not checking Other issues:  Prediabetes :  Lab Results  Component Value Date   HGBA1C 6.0 04/23/2022    Elevated Cholesterol:  well controlled  on atorvastatin  10 mg daily Lab Results  Component Value Date   CHOL 125 04/23/2022   HDL 51.60 04/23/2022   LDLCALC 58 04/23/2022   TRIG 76.0 04/23/2022   CHOLHDL 2 04/23/2022  Using medications without problems:none Muscle aches: none Diet compliance: weight watchers Exercise: 2 times a week. Other complaints:  MDD Prozac 10 mg p.o. daily Flowsheet Row Clinical Support from 04/24/2022 in [place] [facility] Total  Score 0      B12 def:   B12 in the normal range on supplementation  Relevant past medical, surgical, family and social history reviewed and updated as indicated. Interim medical history since our last visit reviewed. Allergies and medications reviewed and updated. Outpatient Medications Prior to Visit  Medication Sig Dispense Refill   albuterol (PROAIR HFA) 108 (90 Base) MCG/ACT inhaler Inhale 2 puffs into the lungs every 6 (six) hours as needed. 8 g 0   atorvastatin (LIPITOR) 10 MG tablet TAKE 1 TABLET BY MOUTH DAILY 90 tablet 0   Biotin 10000 MCG TABS Take 1 tablet by mouth daily.     cetirizine (ZYRTEC) 10 MG tablet Take 10 mg by mouth as needed.      Cholecalciferol (VITAMIN D3) 2000 UNITS TABS Take by mouth daily.      docusate sodium (COLACE) 100 MG capsule Take 100 mg by mouth daily.     FLUoxetine (PROZAC) 10 MG capsule TAKE 1 CAPSULE BY MOUTH DAILY 90 capsule 0   hydrochlorothiazide (HYDRODIURIL) 25 MG tablet TAKE 1 TABLET BY MOUTH DAILY 90 tablet 0   Multiple Vitamin (MULTI-VITAMINS) TABS Take by mouth daily.     Potassium Gluconate 550 MG TABS Take by mouth daily.      promethazine (PHENERGAN) 25 MG suppository Place 1 suppository (25 mg total) rectally every 6 (six) hours  as needed. 12 each 1   vitamin B-12 (CYANOCOBALAMIN) 1000 MCG tablet Take 1,000 mcg by mouth daily.     FLUZONE HIGH-DOSE QUADRIVALENT 0.7 ML SUSY      No facility-administered medications prior to visit.     Per HPI unless specifically indicated in ROS section below Review of Systems  Constitutional:  Negative for fatigue and fever.  HENT:  Negative for congestion.   Eyes:  Negative for pain.  Respiratory:  Negative for cough and shortness of breath.        Snoring  Cardiovascular:  Negative for chest pain, palpitations and leg swelling.  Gastrointestinal:  Negative for abdominal pain.  Genitourinary:  Negative for dysuria and vaginal bleeding.       Stool leakage  Musculoskeletal:  Negative for  back pain.  Skin:  Negative for rash.  Neurological:  Negative for syncope, light-headedness and headaches.  Psychiatric/Behavioral:  Negative for dysphoric mood.    Objective:  BP 136/80   Pulse 67   Temp 98.1 F (36.7 C) (Oral)   Ht 5' 2.5" (1.588 m)   Wt 170 lb 4 oz (77.2 kg)   SpO2 96%   BMI 30.64 kg/m   Wt Readings from Last 3 Encounters:  04/28/22 170 lb 4 oz (77.2 kg)  04/24/22 168 lb (76.2 kg)  07/28/21 168 lb 5 oz (76.3 kg)      Physical Exam Vitals and nursing note reviewed.  Constitutional:      General: She is not in acute distress.    Appearance: Normal appearance. She is well-developed. She is not ill-appearing or toxic-appearing.  HENT:     Head: Normocephalic.     Right Ear: Hearing, tympanic membrane, ear canal and external ear normal.     Left Ear: Hearing, tympanic membrane, ear canal and external ear normal.     Nose: Nose normal.  Eyes:     General: Lids are normal. Lids are everted, no foreign bodies appreciated.     Conjunctiva/sclera: Conjunctivae normal.     Pupils: Pupils are equal, round, and reactive to light.  Neck:     Thyroid: No thyroid mass or thyromegaly.     Vascular: No carotid bruit.     Trachea: Trachea normal.  Cardiovascular:     Rate and Rhythm: Normal rate and regular rhythm.     Heart sounds: Normal heart sounds, S1 normal and S2 normal. No murmur heard.    No gallop.  Pulmonary:     Effort: Pulmonary effort is normal. No respiratory distress.     Breath sounds: Normal breath sounds. No wheezing, rhonchi or rales.  Chest:  Breasts:    Breasts are symmetrical.     Right: Normal. No inverted nipple, mass or nipple discharge.     Left: Normal. No inverted nipple, mass or nipple discharge.     Comments: No current tenderness on exam. Abdominal:     General: Bowel sounds are normal. There is no distension or abdominal bruit.     Palpations: Abdomen is soft. There is no fluid wave or mass.     Tenderness: There is no  abdominal tenderness. There is no guarding or rebound.     Hernia: No hernia is present.  Musculoskeletal:     Cervical back: Normal range of motion and neck supple.  Lymphadenopathy:     Cervical: No cervical adenopathy.  Skin:    General: Skin is warm and dry.     Findings: No rash.  Neurological:  Mental Status: She is alert.     Cranial Nerves: No cranial nerve deficit.     Sensory: No sensory deficit.  Psychiatric:        Mood and Affect: Mood is not anxious or depressed.        Speech: Speech normal.        Behavior: Behavior normal. Behavior is cooperative.        Judgment: Judgment normal.       Results for orders placed or performed in visit on 04/23/22  Hemoglobin A1c  Result Value Ref Range   Hgb A1c MFr Bld 6.0 4.6 - 6.5 %  Vitamin B12  Result Value Ref Range   Vitamin B-12 667 211 - 911 pg/mL  Comprehensive metabolic panel  Result Value Ref Range   Sodium 142 135 - 145 mEq/L   Potassium 3.7 3.5 - 5.1 mEq/L   Chloride 104 96 - 112 mEq/L   CO2 31 19 - 32 mEq/L   Glucose, Bld 113 (H) 70 - 99 mg/dL   BUN 15 6 - 23 mg/dL   Creatinine, Ser 0.66 0.40 - 1.20 mg/dL   Total Bilirubin 0.5 0.2 - 1.2 mg/dL   Alkaline Phosphatase 72 39 - 117 U/L   AST 28 0 - 37 U/L   ALT 40 (H) 0 - 35 U/L   Total Protein 6.6 6.0 - 8.3 g/dL   Albumin 4.4 3.5 - 5.2 g/dL   GFR 87.71 >60.00 mL/min   Calcium 9.5 8.4 - 10.5 mg/dL  Lipid panel  Result Value Ref Range   Cholesterol 125 0 - 200 mg/dL   Triglycerides 76.0 0.0 - 149.0 mg/dL   HDL 51.60 >39.00 mg/dL   VLDL 15.2 0.0 - 40.0 mg/dL   LDL Cholesterol 58 0 - 99 mg/dL   Total CHOL/HDL Ratio 2    NonHDL 73.49      COVID 19 screen:  No recent travel or known exposure to COVID19 The patient denies respiratory symptoms of COVID 19 at this time. The importance of social distancing was discussed today.   Assessment and Plan The patient's preventative maintenance and recommended screening tests for an annual wellness exam were  reviewed in full today. Brought up to date unless services declined.  Counselled on the importance of diet, exercise, and its role in overall health and mortality. The patient's FH and SH was reviewed, including their home life, tobacco status, and drug and alcohol status.    PAP not indicated given partial hysterectomy, no family history of ovarian cancer. Mammogram 12/2021 nml  Colonoscopy 09/2020 Dr. Alice Reichert,  no further indicated.  Flu vaccine, pneumonia uptodate, COVID vaccine x 3,  rx given for shingrix vaccine in 2021, due for Td  Hep c neg  DEXA: 2017  Osteopenia,  improved  osteopenia on 12/19/2020 .Marland Kitchen  Repeat in 2027  Problem List Items Addressed This Visit     Aortic atherosclerosis (HCC) (Chronic)    LDL goal < 70 on atorvastatin       B12 deficiency (Chronic)    B12 in the normal range on supplementation      Essential hypertension, benign (Chronic)    Stable, chronic.  Continue current medication.     HCTZ 25 mg daily      Lung nodule (Chronic)    Chest CT 2022: Stable right upper and right middle lobe sub 3 mm pulmonary nodules. Given greater than 12 month stability, no specific imaging follow-up is required.      Prediabetes (Chronic)  Chronic, stable control with diet      Breast pain, left    Cut back on caffeine and alcohol.  Refer for imaging.  No abnormality noted.      Relevant Orders   MM DIAG BREAST TOMO BILATERAL   US BREAST LTD UNI LEFT INC AXILLA   US BREAST LTD UNI RIGHT INC AXILLA   MDD (major depressive disorder), single episode, moderate (HCC)    Chronic, stable control on Prozac 10 mg p.o. daily.      Snoring    Very typical history for possible OSA. Refer for home sleep study and likely CPAP.      Relevant Orders   Ambulatory referral to Pulmonology   Thyroid nodule    Seen incidentally on CT Thyroid US 2022: Mildly heterogeneous thyroid gland with multiple bilateral thyroid nodules, none of which meet criteria for follow-up  or fine-needle aspiration.      Other Visit Diagnoses     Routine general medical examination at a health care facility    -  Primary        Eliezer Lofts, MD

## 2022-04-28 NOTE — Assessment & Plan Note (Signed)
Chronic, stable control on Prozac 10 mg p.o. daily.

## 2022-04-29 ENCOUNTER — Other Ambulatory Visit: Payer: Self-pay | Admitting: Family Medicine

## 2022-04-29 DIAGNOSIS — N644 Mastodynia: Secondary | ICD-10-CM

## 2022-04-30 ENCOUNTER — Other Ambulatory Visit: Payer: Self-pay | Admitting: Family Medicine

## 2022-05-05 ENCOUNTER — Ambulatory Visit: Payer: Medicare Other | Admitting: Podiatry

## 2022-05-05 ENCOUNTER — Encounter: Payer: Self-pay | Admitting: Podiatry

## 2022-05-05 DIAGNOSIS — M24576 Contracture, unspecified foot: Secondary | ICD-10-CM | POA: Diagnosis not present

## 2022-05-05 DIAGNOSIS — M2042 Other hammer toe(s) (acquired), left foot: Secondary | ICD-10-CM | POA: Diagnosis not present

## 2022-05-05 DIAGNOSIS — M2041 Other hammer toe(s) (acquired), right foot: Secondary | ICD-10-CM

## 2022-05-05 NOTE — Progress Notes (Signed)
She presents today for flexor tenotomy's to toe #2 on the right foot and #4 on the left foot.  She states that they really bother her by the position the way they are sitting causes significant pain with shoe gear.  She denies any changes in her past medical history medications allergies surgery social history.  Objective: Pulses are palpable.  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  Plan: Discussed etiology pathology conservative surgical therapies at this point local anesthetic was administered after anesthetic the toe was prepped.  A small stab incision was made at the level of the DIPJ plantarly on the second digit of the right foot and a small transverse motion was made which allow the 18-gauge needle to transect the long flexor tendon at the level of the DIPJ.  Similar method was used at the level of the PIPJ for the fourth digit left foot.  There is minimal bleeding and the area was copiously normal sterile saline Betadine was applied and a dry sterile compressive dressing which she will leave on until this time next week.  I will follow-up with her in 1 week she was placed in a [place] shoe should she have questions or concerns she will notify us immediately.

## 2022-05-05 NOTE — Patient Instructions (Signed)
Leave bandage in place and dry for 4 days, then remove. You may wash foot normally after removal of bandage. DO NOT SOAK FOOT! Dry completely afterwards and may use a bandaid over incision if needed. We will follow up with you in 1 weeks for recheck.  

## 2022-05-12 ENCOUNTER — Ambulatory Visit (INDEPENDENT_AMBULATORY_CARE_PROVIDER_SITE_OTHER): Payer: Medicare Other | Admitting: Podiatry

## 2022-05-12 ENCOUNTER — Encounter: Payer: Self-pay | Admitting: Podiatry

## 2022-05-12 VITALS — BP 156/83 | HR 63

## 2022-05-12 DIAGNOSIS — M2042 Other hammer toe(s) (acquired), left foot: Secondary | ICD-10-CM

## 2022-05-12 DIAGNOSIS — Z9889 Other specified postprocedural states: Secondary | ICD-10-CM

## 2022-05-12 DIAGNOSIS — M24576 Contracture, unspecified foot: Secondary | ICD-10-CM

## 2022-05-12 DIAGNOSIS — M2041 Other hammer toe(s) (acquired), right foot: Secondary | ICD-10-CM

## 2022-05-12 NOTE — Progress Notes (Signed)
She presents today for follow-up of her tenotomy's to the second digit of the right foot and the fourth toe of the left foot states that she is doing very well with that has no problems whatsoever and is very excited on how they look and how they feel.  Objective: Vital signs stable alert oriented x 3 there is no erythema edema cellulitis drainage or odor no signs of infection.  Assessment: I will allow her to get back to her regular routine she will follow-up with me on an as-needed basis for more toes.

## 2022-05-13 ENCOUNTER — Ambulatory Visit
Admission: RE | Admit: 2022-05-13 | Discharge: 2022-05-13 | Disposition: A | Discharge status: Home / Self Care | Payer: Medicare Other | Source: Ambulatory Visit | Attending: Family Medicine | Admitting: Family Medicine

## 2022-05-13 DIAGNOSIS — N644 Mastodynia: Secondary | ICD-10-CM | POA: Diagnosis not present

## 2022-05-26 ENCOUNTER — Encounter: Payer: Self-pay | Admitting: Nurse Practitioner

## 2022-05-26 ENCOUNTER — Ambulatory Visit (INDEPENDENT_AMBULATORY_CARE_PROVIDER_SITE_OTHER): Payer: Medicare Other | Admitting: Nurse Practitioner

## 2022-05-26 VITALS — BP 122/70 | HR 65 | Temp 97.9°F | Ht 62.0 in | Wt 168.6 lb

## 2022-05-26 DIAGNOSIS — E66811 Obesity, class 1: Secondary | ICD-10-CM | POA: Insufficient documentation

## 2022-05-26 DIAGNOSIS — G4719 Other hypersomnia: Secondary | ICD-10-CM | POA: Diagnosis not present

## 2022-05-26 DIAGNOSIS — R0683 Snoring: Secondary | ICD-10-CM | POA: Diagnosis not present

## 2022-05-26 DIAGNOSIS — E669 Obesity, unspecified: Secondary | ICD-10-CM | POA: Insufficient documentation

## 2022-05-26 NOTE — Patient Instructions (Addendum)
Given your symptoms, I am concerned you may have sleep disordered breathing with sleep apnea. You will need a sleep study for further evaluation. Someone will contact you to schedule this.  We discussed how untreated sleep apnea puts an individual at risk for cardiac arrhthymias, pulm HTN, DM, stroke and increases their risk for daytime accidents. We also briefly reviewed treatment options including weight loss, side sleeping position, oral appliance, CPAP therapy or referral to ENT for possible surgical options  Use caution when driving and pull over if you become sleepy  Follow up in 6-8 weeks with Gina Marilene Vath,NP via video visit, or sooner, if needed

## 2022-05-26 NOTE — Assessment & Plan Note (Signed)
BMI 30.8. Reviewed correlation between obesity and OSA. She is working on healthy weight loss measures with [employer].

## 2022-05-26 NOTE — Progress Notes (Signed)
$'@Patient'K$  ID: Gina Harper, female    DOB: 25-Jun-1950, 72 y.o.   MRN: [id number]  Chief Complaint  Patient presents with   sleep consult    No prior sleep study- loud snoring, restless sleep and daytime sleepiness.     Referring provider: Jinny Sanders, MD  HPI: 72 year old female, former smoker referred for sleep consult. Past medical history significant for HTN, exercise induced asthma, lung nodule (considered benign), GERD, MDD, HLD, prediabetes, insomnia.   TEST/EVENTS:   05/26/2022: Today - sleep consult  Patient presents today for sleep consult, referred by Dr. Diona Browner. She has had trouble with her sleep and feeling tired for many years now. She has trouble with falling asleep and wakes frequently throughout the night. When she gets up in the morning, she's tired and feelings fatigued throughout the day. She has been told she snores and has even woken herself up snoring. There are a lot of mornings she wakes up with morning headaches and/or dry mouth. She has had some drowsy driving in the past but not recently. Some sleep talking; never has had any sleep walking. No witnessed apneas. No history of narcolepsy and no symptoms of cataplexy. She goes to bed between 10-11 pm. Takes 1-2 hours to fall asleep. Has tried melatonin in the past with little success. She wakes 3-4 times a night. Gets up between 830-9 am. She is retired. Weight is down 10 lb in the past 2 years. Never had a previous sleep study. She has a history of HTN, exercise induced asthma which is well-controlled on PRN SABA with rare use, allergies. She has a history of melanoma as well, cured. She is a former smoker; quit 1990 with 34 pack year history. She drinks 1-2 glasses of wine a week. She lives at home with her husband. Family history of emphysema, allergies, heart disease and cancer.   Epworth 22  Allergies  Allergen Reactions   Cefdinir Hives   Lisinopril Cough and Other (See Comments)   Meloxicam Swelling    Omnipen [Ampicillin] Other (See Comments)   Augmentin [Amoxicillin-Pot Clavulanate] Rash   Clarithromycin Rash    Immunization History  Administered Date(s) Administered   Fluad Quad(high Dose 65+) 01/31/2019   Influenza Split 01/19/2011   Influenza, High Dose Seasonal PF 02/10/2021   Influenza,inj,Quad PF,6+ Mos 02/17/2013, 01/14/2016, 01/26/2017, 01/28/2018   Influenza-Unspecified 02/14/2014, 02/01/2015, 02/02/2020, 02/22/2022   PFIZER(Purple Top)SARS-COV-2 Vaccination 05/26/2019, 06/21/2019, 04/29/2020   Pneumococcal Conjugate-13 07/02/2015   Pneumococcal Polysaccharide-23 01/26/2017   Tdap 04/20/2008   Zoster, Live 04/17/2013    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[place])    rectal spasm,syncope, nausea, ongoing for years   Allergic rhinitis, cause unspecified    Allergy    Alopecia, unspecified    Anemia    pernicious anemia   Arthritis    hands, knees, hips   Asthma    Cancer ([place])    melanoma   Chicken pox    Depressive disorder, not elsewhere classified    Disorder of bone and cartilage, unspecified    Esophageal reflux    GERD (gastroesophageal reflux disease)    Hepatitis    History of shingles    Insomnia, unspecified    Measles    red measles   Motion sickness    Mumps    Other abnormal glucose    Palpitations    Syncope, vasovagal    Tobacco use disorder    Unspecified asthma(493.90)    exercise  induced. Never hospitalized   Unspecified essential hypertension    Unspecified hearing loss    Unspecified pruritic disorder     Tobacco History: Social History   Tobacco Use  Smoking Status Former   Packs/day: 2.00   Years: 17.00   Total pack years: 34.00   Types: Cigarettes   Quit date: 05/22/1988   Years since quitting: 34.0  Smokeless Tobacco Never  Tobacco Comments   Quit 22 years ago, 1990   Counseling given: Not Answered Tobacco comments: Quit 22 years ago, 1990   Outpatient Medications Prior to Visit   Medication Sig Dispense Refill   albuterol (PROAIR HFA) 108 (90 Base) MCG/ACT inhaler Inhale 2 puffs into the lungs every 6 (six) hours as needed. 8 g 0   atorvastatin (LIPITOR) 10 MG tablet TAKE 1 TABLET BY MOUTH DAILY 90 tablet 0   Biotin 10000 MCG TABS Take 1 tablet by mouth daily.     cetirizine (ZYRTEC) 10 MG tablet Take 10 mg by mouth as needed.      Cholecalciferol (VITAMIN D3) 2000 UNITS TABS Take by mouth daily.      docusate sodium (COLACE) 100 MG capsule Take 100 mg by mouth daily.     FLUAD QUADRIVALENT 0.5 ML injection      FLUoxetine (PROZAC) 10 MG capsule TAKE 1 CAPSULE BY MOUTH DAILY 100 capsule 1   hydrochlorothiazide (HYDRODIURIL) 25 MG tablet TAKE 1 TABLET BY MOUTH DAILY 90 tablet 0   Multiple Vitamin (MULTI-VITAMINS) TABS Take by mouth daily.     Potassium Gluconate 550 MG TABS Take by mouth daily.      promethazine (PHENERGAN) 25 MG suppository Place 1 suppository (25 mg total) rectally every 6 (six) hours as needed. 12 each 1   vitamin B-12 (CYANOCOBALAMIN) 1000 MCG tablet Take 1,000 mcg by mouth daily.     No facility-administered medications prior to visit.     Review of Systems:   Constitutional: No weight loss or gain, night sweats, fevers, chills, or lassitude. +excessive daytime fatigue  HEENT: No difficulty swallowing, tooth/dental problems, or sore throat. No sneezing, itching, ear ache, nasal congestion, or post nasal drip. +headaches, dry mouth CV:  No chest pain, orthopnea, PND, swelling in lower extremities, anasarca, dizziness, palpitations, syncope Resp: +snoring; shortness of breath with exertion; occasional dry cough, chronic. No excess mucus or change in color of mucus. No hemoptysis. No wheezing.  No chest wall deformity GI:  +occasional heartburn, indigestion. No abdominal pain, nausea, vomiting, diarrhea, change in bowel habits, loss of appetite GU: No dysuria, change in color of urine, urgency or frequency.  Skin: No rash, lesions,  ulcerations MSK:  No joint pain or swelling.   Neuro: No dizziness or lightheadedness.  Psych: No depression or anxiety. Mood stable. +sleep disturbance    Physical Exam:  BP 122/70 (BP Location: Left Arm, Cuff Size: Normal)   Pulse 65   Temp 97.9 F (36.6 C) (Temporal)   Ht '5\' 2"'$  (1.575 m)   Wt 168 lb 9.6 oz (76.5 kg)   SpO2 96%   BMI 30.84 kg/m   GEN: Pleasant, interactive, well-appearing; obese; in no acute distress. HEENT:  Normocephalic and atraumatic. PERRLA. Sclera white. Nasal turbinates pink, moist and patent bilaterally. No rhinorrhea present. Oropharynx pink and moist, without exudate or edema. No lesions, ulcerations, or postnasal drip. Mallampati II/III NECK:  Supple w/ fair ROM. No JVD present. Normal carotid impulses w/o bruits. Thyroid symmetrical with no goiter or nodules palpated. No lymphadenopathy.   CV: RRR,  no m/r/g, no peripheral edema. Pulses intact, +2 bilaterally. No cyanosis, pallor or clubbing. PULMONARY:  Unlabored, regular breathing. Clear bilaterally A&P w/o wheezes/rales/rhonchi. No accessory muscle use.  GI: BS present and normoactive. Soft, non-tender to palpation. No organomegaly or masses detected.  MSK: No erythema, warmth or tenderness. Cap refil <2 sec all extrem. No deformities or joint swelling noted.  Neuro: A/Ox3. No focal deficits noted.   Skin: Warm, no lesions or rashe Psych: Normal affect and behavior. Judgement and thought content appropriate.     Lab Results:  CBC    Component Value Date/Time   WBC 9.2 02/25/2019 0840   RBC 4.09 02/25/2019 0840   HGB 12.9 02/25/2019 0840   HGB 13.5 09/30/2011 0805   HCT 37.6 02/25/2019 0840   HCT 39.9 09/30/2011 0805   PLT 269 02/25/2019 0840   PLT 241 09/30/2011 0805   MCV 91.9 02/25/2019 0840   MCV 94 09/30/2011 0805   MCH 31.5 02/25/2019 0840   MCHC 34.3 02/25/2019 0840   RDW 12.8 02/25/2019 0840   RDW 12.7 09/30/2011 0805   LYMPHSABS 2.4 09/07/2014 0902   LYMPHSABS 2.6 09/30/2011  0805   MONOABS 0.5 09/07/2014 0902   MONOABS 0.6 09/30/2011 0805   EOSABS 0.1 09/07/2014 0902   EOSABS 0.1 09/30/2011 0805   BASOSABS 0.0 09/07/2014 0902   BASOSABS 0.1 09/30/2011 0805    BMET    Component Value Date/Time   NA 142 04/23/2022 0844   NA 138 09/30/2011 0805   K 3.7 04/23/2022 0844   K 3.5 09/30/2011 0805   CL 104 04/23/2022 0844   CL 103 09/30/2011 0805   CO2 31 04/23/2022 0844   CO2 28 09/30/2011 0805   GLUCOSE 113 (H) 04/23/2022 0844   GLUCOSE 101 (H) 09/30/2011 0805   BUN 15 04/23/2022 0844   BUN 16 09/30/2011 0805   CREATININE 0.66 04/23/2022 0844   CREATININE 0.71 01/31/2014 1026   CALCIUM 9.5 04/23/2022 0844   CALCIUM 9.1 09/30/2011 0805   GFRNONAA >60 02/25/2019 0840   GFRNONAA >89 01/31/2014 1026   GFRAA >60 02/25/2019 0840   GFRAA >89 01/31/2014 1026    BNP No results found for: "BNP"   Imaging:  MM DIAG BREAST TOMO UNI LEFT  Result Date: 05/13/2022 CLINICAL DATA:  Patient presents with focal left breast pain. No reported lumps. EXAM: DIGITAL DIAGNOSTIC UNILATERAL LEFT MAMMOGRAM WITH TOMOSYNTHESIS; ULTRASOUND LEFT BREAST LIMITED TECHNIQUE: Left digital diagnostic mammography and breast tomosynthesis was performed.; Targeted ultrasound examination of the left breast was performed. COMPARISON:  Previous exam(s). ACR Breast Density Category b: There are scattered areas of fibroglandular density. FINDINGS: There are no masses, areas of architectural distortion areas of significant asymmetry or suspicious calcifications. No mammographic change. On physical exam, no mass is palpated along the lateral left breast. Targeted ultrasound is performed, showing normal tissue throughout the lateral left breast in the area of focal pain. No mass or suspicious lesion. IMPRESSION: No evidence of breast malignancy. RECOMMENDATION: 1. Annual screening mammography. Last screening study performed 12/23/2021. I have discussed the findings and recommendations with the  patient. If applicable, a reminder letter will be sent to the patient regarding the next appointment. BI-RADS CATEGORY  1: Negative. Electronically Signed   By: Lajean Manes M.D.   On: 05/13/2022 09:49  US BREAST LTD UNI LEFT INC AXILLA  Result Date: 05/13/2022 CLINICAL DATA:  Patient presents with focal left breast pain. No reported lumps. EXAM: DIGITAL DIAGNOSTIC UNILATERAL LEFT MAMMOGRAM WITH TOMOSYNTHESIS; ULTRASOUND LEFT BREAST  LIMITED TECHNIQUE: Left digital diagnostic mammography and breast tomosynthesis was performed.; Targeted ultrasound examination of the left breast was performed. COMPARISON:  Previous exam(s). ACR Breast Density Category b: There are scattered areas of fibroglandular density. FINDINGS: There are no masses, areas of architectural distortion areas of significant asymmetry or suspicious calcifications. No mammographic change. On physical exam, no mass is palpated along the lateral left breast. Targeted ultrasound is performed, showing normal tissue throughout the lateral left breast in the area of focal pain. No mass or suspicious lesion. IMPRESSION: No evidence of breast malignancy. RECOMMENDATION: 1. Annual screening mammography. Last screening study performed 12/23/2021. I have discussed the findings and recommendations with the patient. If applicable, a reminder letter will be sent to the patient regarding the next appointment. BI-RADS CATEGORY  1: Negative. Electronically Signed   By: Lajean Manes M.D.   On: 05/13/2022 09:49         No data to display          No results found for: "NITRICOXIDE"      Assessment & Plan:   Excessive daytime sleepiness She has snoring, excessive daytime sleepiness, morning headaches, restless sleep. BMI 30.8. Epworth 22. Given this,  I am concerned she could have sleep disordered breathing with obstructive sleep apnea. She will need sleep study for further evaluation.    - discussed how weight can impact sleep and risk for  sleep disordered breathing - discussed options to assist with weight loss: combination of diet modification, cardiovascular and strength training exercises   - had an extensive discussion regarding the adverse health consequences related to untreated sleep disordered breathing - specifically discussed the risks for hypertension, coronary artery disease, cardiac dysrhythmias, cerebrovascular disease, and diabetes - lifestyle modification discussed   - discussed how sleep disruption can increase risk of accidents, particularly when driving - safe driving practices were discussed  Patient Instructions  Given your symptoms, I am concerned you may have sleep disordered breathing with sleep apnea. You will need a sleep study for further evaluation. Someone will contact you to schedule this.  We discussed how untreated sleep apnea puts an individual at risk for cardiac arrhthymias, pulm HTN, DM, stroke and increases their risk for daytime accidents. We also briefly reviewed treatment options including weight loss, side sleeping position, oral appliance, CPAP therapy or referral to ENT for possible surgical options  Use caution when driving and pull over if you become sleepy  Follow up in 6-8 weeks with Katie Jenner Rosier,NP via video visit, or sooner, if needed   Obesity (BMI 30.0-34.9) BMI 30.8. Reviewed correlation between obesity and OSA. She is working on healthy weight loss measures with [employer].    I spent 35 minutes of dedicated to the care of this patient on the date of this encounter to include pre-visit review of records, face-to-face time with the patient discussing conditions above, post visit ordering of testing, clinical documentation with the electronic health record, making appropriate referrals as documented, and communicating necessary findings to members of the patients care team.  Clayton Bibles, NP 05/26/2022  Pt aware and understands NP's role.

## 2022-05-26 NOTE — Assessment & Plan Note (Signed)
She has snoring, excessive daytime sleepiness, morning headaches, restless sleep. BMI 30.8. Epworth 22. Given this,  I am concerned she could have sleep disordered breathing with obstructive sleep apnea. She will need sleep study for further evaluation.    - discussed how weight can impact sleep and risk for sleep disordered breathing - discussed options to assist with weight loss: combination of diet modification, cardiovascular and strength training exercises   - had an extensive discussion regarding the adverse health consequences related to untreated sleep disordered breathing - specifically discussed the risks for hypertension, coronary artery disease, cardiac dysrhythmias, cerebrovascular disease, and diabetes - lifestyle modification discussed   - discussed how sleep disruption can increase risk of accidents, particularly when driving - safe driving practices were discussed  Patient Instructions  Given your symptoms, I am concerned you may have sleep disordered breathing with sleep apnea. You will need a sleep study for further evaluation. Someone will contact you to schedule this.  We discussed how untreated sleep apnea puts an individual at risk for cardiac arrhthymias, pulm HTN, DM, stroke and increases their risk for daytime accidents. We also briefly reviewed treatment options including weight loss, side sleeping position, oral appliance, CPAP therapy or referral to ENT for possible surgical options  Use caution when driving and pull over if you become sleepy  Follow up in 6-8 weeks with Gina Shanika Levings,NP via video visit, or sooner, if needed

## 2022-05-28 NOTE — Progress Notes (Signed)
Reviewed and agree with assessment/plan.   Chesley Mires, MD [facility] Pulmonary/Critical Care 05/28/2022, 8:21 AM Pager:  [phone]

## 2022-06-01 DIAGNOSIS — L918 Other hypertrophic disorders of the skin: Secondary | ICD-10-CM | POA: Diagnosis not present

## 2022-06-01 DIAGNOSIS — L570 Actinic keratosis: Secondary | ICD-10-CM | POA: Diagnosis not present

## 2022-06-01 DIAGNOSIS — L298 Other pruritus: Secondary | ICD-10-CM | POA: Diagnosis not present

## 2022-06-01 DIAGNOSIS — L821 Other seborrheic keratosis: Secondary | ICD-10-CM | POA: Diagnosis not present

## 2022-06-17 ENCOUNTER — Other Ambulatory Visit: Payer: Self-pay | Admitting: Family Medicine

## 2022-07-17 ENCOUNTER — Ambulatory Visit: Payer: Medicare Other | Admitting: Nurse Practitioner

## 2022-07-17 DIAGNOSIS — G4733 Obstructive sleep apnea (adult) (pediatric): Secondary | ICD-10-CM | POA: Diagnosis not present

## 2022-07-17 DIAGNOSIS — R0683 Snoring: Secondary | ICD-10-CM

## 2022-07-17 DIAGNOSIS — G4719 Other hypersomnia: Secondary | ICD-10-CM

## 2022-07-20 DIAGNOSIS — H40053 Ocular hypertension, bilateral: Secondary | ICD-10-CM | POA: Diagnosis not present

## 2022-07-20 DIAGNOSIS — H179 Unspecified corneal scar and opacity: Secondary | ICD-10-CM | POA: Diagnosis not present

## 2022-07-20 DIAGNOSIS — H2513 Age-related nuclear cataract, bilateral: Secondary | ICD-10-CM | POA: Diagnosis not present

## 2022-07-28 DIAGNOSIS — H2512 Age-related nuclear cataract, left eye: Secondary | ICD-10-CM | POA: Diagnosis not present

## 2022-07-30 ENCOUNTER — Encounter: Payer: Self-pay | Admitting: Ophthalmology

## 2022-07-30 NOTE — Anesthesia Preprocedure Evaluation (Addendum)
Anesthesia Evaluation  Patient identified by MRN, date of birth, ID band Patient awake    Reviewed: Allergy & Precautions, H&P , NPO status , Patient's Chart, lab work & pertinent test results  Airway Mallampati: III  TM Distance: >3 FB Neck ROM: Full    Dental no notable dental hx.    Pulmonary neg pulmonary ROS, asthma , former smoker Had sleep study, likely sleep apnea, but is awaiting results of sleep study   Pulmonary exam normal breath sounds clear to auscultation       Cardiovascular hypertension, Pt. on medications negative cardio ROS Normal cardiovascular exam Rhythm:Regular Rate:Normal  Hx vasovagal syncope   Neuro/Psych Seizures -,  PSYCHIATRIC DISORDERS  Depression    Hearing aids both ears  negative neurological ROS  negative psych ROS   GI/Hepatic negative GI ROS, Neg liver ROS,GERD  ,,Epic says 'hepatitis," but I can't find what kind of hepatitis   Endo/Other  negative endocrine ROS    Renal/GU negative Renal ROS  negative genitourinary   Musculoskeletal negative musculoskeletal ROS (+)    Abdominal   Peds negative pediatric ROS (+)  Hematology negative hematology ROS (+)   Anesthesia Other Findings Unspecified pruritic disorder  Other abnormal glucose Insomnia, unspecified Esophageal reflux Alopecia, unspecified  Depressive disorder, not elsewhere classified Disorder of bone and cartilage, unspecified Quit smoking 1 1/2 PPD in 1990s Unspecified essential hypertension Palpitations Unspecified hearing loss  Allergic rhinitis, cause unspecified Chicken pox  Mumps Measles  Syncope, vasovagal History of shingles  GERD (gastroesophageal reflux disease) Hepatitis  Allergy Abdominal  epilepsy  Anemia Unspecified asthma(493.90)  Arthritis Motion sickness  Cancer Asthma  Wears hearing aid in both ears    Reproductive/Obstetrics negative OB ROS                              Anesthesia Physical Anesthesia Plan  ASA: 2  Anesthesia Plan: MAC   Post-op Pain Management:    Induction: Intravenous  PONV Risk Score and Plan:   Airway Management Planned: Natural Airway and Nasal Cannula  Additional Equipment:   Intra-op Plan:   Post-operative Plan:   Informed Consent: I have reviewed the patients History and Physical, chart, labs and discussed the procedure including the risks, benefits and alternatives for the proposed anesthesia with the patient or authorized representative who has indicated his/her understanding and acceptance.     Dental Advisory Given  Plan Discussed with: Anesthesiologist, CRNA and Surgeon  Anesthesia Plan Comments: (Patient consented for risks of anesthesia including but not limited to:  - adverse reactions to medications - damage to eyes, teeth, lips or other oral mucosa - nerve damage due to positioning  - sore throat or hoarseness - Damage to heart, brain, nerves, lungs, other parts of body or loss of life  Patient voiced understanding.)       Anesthesia Quick Evaluation

## 2022-07-31 DIAGNOSIS — G4733 Obstructive sleep apnea (adult) (pediatric): Secondary | ICD-10-CM | POA: Diagnosis not present

## 2022-08-03 NOTE — Discharge Instructions (Signed)

## 2022-08-03 NOTE — Progress Notes (Signed)
Please notify patient HST showed severe OSA with aHI 52.9/h and SpO2 low 79%. Needs next available follow up to discuss treatment options. Ok to do video visit if needed. Thanks.

## 2022-08-04 NOTE — Progress Notes (Signed)
Called the pt and there was no answer- LMTCB    

## 2022-08-05 ENCOUNTER — Other Ambulatory Visit: Payer: Self-pay

## 2022-08-05 ENCOUNTER — Ambulatory Visit: Payer: Medicare Other | Admitting: Anesthesiology

## 2022-08-05 ENCOUNTER — Telehealth: Payer: Self-pay | Admitting: Nurse Practitioner

## 2022-08-05 ENCOUNTER — Encounter: Payer: Self-pay | Admitting: Ophthalmology

## 2022-08-05 ENCOUNTER — Encounter
Admission: RE | Disposition: A | Discharge status: Home / Self Care | Payer: Self-pay | Source: Home / Self Care | Attending: Ophthalmology

## 2022-08-05 ENCOUNTER — Ambulatory Visit
Admission: RE | Admit: 2022-08-05 | Discharge: 2022-08-05 | Disposition: A | Discharge status: Home / Self Care | Payer: Medicare Other | Attending: Ophthalmology | Admitting: Ophthalmology

## 2022-08-05 DIAGNOSIS — K219 Gastro-esophageal reflux disease without esophagitis: Secondary | ICD-10-CM | POA: Insufficient documentation

## 2022-08-05 DIAGNOSIS — J45909 Unspecified asthma, uncomplicated: Secondary | ICD-10-CM | POA: Diagnosis not present

## 2022-08-05 DIAGNOSIS — F32A Depression, unspecified: Secondary | ICD-10-CM | POA: Diagnosis not present

## 2022-08-05 DIAGNOSIS — H2512 Age-related nuclear cataract, left eye: Secondary | ICD-10-CM | POA: Insufficient documentation

## 2022-08-05 DIAGNOSIS — M1990 Unspecified osteoarthritis, unspecified site: Secondary | ICD-10-CM | POA: Diagnosis not present

## 2022-08-05 DIAGNOSIS — G4700 Insomnia, unspecified: Secondary | ICD-10-CM | POA: Insufficient documentation

## 2022-08-05 DIAGNOSIS — R569 Unspecified convulsions: Secondary | ICD-10-CM | POA: Insufficient documentation

## 2022-08-05 DIAGNOSIS — L299 Pruritus, unspecified: Secondary | ICD-10-CM | POA: Diagnosis not present

## 2022-08-05 DIAGNOSIS — Z87891 Personal history of nicotine dependence: Secondary | ICD-10-CM | POA: Diagnosis not present

## 2022-08-05 DIAGNOSIS — R55 Syncope and collapse: Secondary | ICD-10-CM | POA: Diagnosis not present

## 2022-08-05 DIAGNOSIS — R002 Palpitations: Secondary | ICD-10-CM | POA: Diagnosis not present

## 2022-08-05 DIAGNOSIS — I10 Essential (primary) hypertension: Secondary | ICD-10-CM | POA: Insufficient documentation

## 2022-08-05 HISTORY — DX: Presence of external hearing-aid: Z97.4

## 2022-08-05 HISTORY — PX: CATARACT EXTRACTION W/PHACO: SHX586

## 2022-08-05 SURGERY — PHACOEMULSIFICATION, CATARACT, WITH IOL INSERTION
Anesthesia: Monitor Anesthesia Care | Site: Eye | Laterality: Left

## 2022-08-05 MED ORDER — ARMC OPHTHALMIC DILATING DROPS
1.0000 | OPHTHALMIC | Status: DC | PRN
  Administered 2022-08-05 (×3): 1 via OPHTHALMIC

## 2022-08-05 MED ORDER — SIGHTPATH DOSE#1 BSS IO SOLN
INTRAOCULAR | Status: DC | PRN
  Administered 2022-08-05: 15 mL

## 2022-08-05 MED ORDER — FENTANYL CITRATE (PF) 100 MCG/2ML IJ SOLN
INTRAMUSCULAR | Status: DC | PRN
  Administered 2022-08-05: 50 ug via INTRAVENOUS

## 2022-08-05 MED ORDER — LACTATED RINGERS IV SOLN: INTRAVENOUS | Status: DC

## 2022-08-05 MED ORDER — SIGHTPATH DOSE#1 BSS IO SOLN
INTRAOCULAR | Status: DC | PRN
  Administered 2022-08-05: 67 mL via OPHTHALMIC

## 2022-08-05 MED ORDER — SIGHTPATH DOSE#1 BSS IO SOLN
INTRAOCULAR | Status: DC | PRN
  Administered 2022-08-05: 1 mL via INTRAMUSCULAR

## 2022-08-05 MED ORDER — SIGHTPATH DOSE#1 NA HYALUR & NA CHOND-NA HYALUR IO KIT
PACK | INTRAOCULAR | Status: DC | PRN
  Administered 2022-08-05: 1 via OPHTHALMIC

## 2022-08-05 MED ORDER — MIDAZOLAM HCL 2 MG/2ML IJ SOLN
INTRAMUSCULAR | Status: DC | PRN
  Administered 2022-08-05: 1 mg via INTRAVENOUS

## 2022-08-05 MED ORDER — BRIMONIDINE TARTRATE-TIMOLOL 0.2-0.5 % OP SOLN
OPHTHALMIC | Status: DC | PRN
  Administered 2022-08-05: 1 [drp] via OPHTHALMIC

## 2022-08-05 MED ORDER — MOXIFLOXACIN HCL 0.5 % OP SOLN
OPHTHALMIC | Status: DC | PRN
  Administered 2022-08-05: .2 mL via OPHTHALMIC

## 2022-08-05 MED ORDER — TETRACAINE HCL 0.5 % OP SOLN
1.0000 [drp] | OPHTHALMIC | Status: DC | PRN
  Administered 2022-08-05 (×3): 1 [drp] via OPHTHALMIC

## 2022-08-05 SURGICAL SUPPLY — 11 items
CATARACT SUITE SIGHTPATH (MISCELLANEOUS) ×1 IMPLANT
FEE CATARACT SUITE SIGHTPATH (MISCELLANEOUS) ×1 IMPLANT
GLOVE SRG 8 PF TXTR STRL LF DI (GLOVE) ×1 IMPLANT
GLOVE SURG ENC TEXT LTX SZ7.5 (GLOVE) ×1 IMPLANT
GLOVE SURG UNDER POLY LF SZ8 (GLOVE) ×1
LENS CLAREON 20.5 (Intraocular Lens) ×1 IMPLANT
LENS IOL CLRN 20.5 (Intraocular Lens) IMPLANT
NDL FILTER BLUNT 18X1 1/2 (NEEDLE) ×1 IMPLANT
NEEDLE FILTER BLUNT 18X1 1/2 (NEEDLE) ×1 IMPLANT
SLEEVE PROTECTION STRL DISP (MISCELLANEOUS) IMPLANT
SYR 3ML LL SCALE MARK (SYRINGE) ×1 IMPLANT

## 2022-08-05 NOTE — Telephone Encounter (Signed)
ATC X1 LVM for patient to call the office back. Please schedule f/u with Katie to discuss treatment option for OSA

## 2022-08-05 NOTE — Transfer of Care (Signed)
Immediate Anesthesia Transfer of Care Note  Patient: Gina Harper  Procedure(s) Performed: CATARACT EXTRACTION PHACO AND INTRAOCULAR LENS PLACEMENT (IOC) LEFT  8.47  01:06.2 (Left: Eye)  Patient Location: PACU  Anesthesia Type: MAC  Level of Consciousness: awake, alert  and patient cooperative  Airway and Oxygen Therapy: Patient Spontanous Breathing and Patient connected to supplemental oxygen  Post-op Assessment: Post-op Vital signs reviewed, Patient's Cardiovascular Status Stable, Respiratory Function Stable, Patent Airway and No signs of Nausea or vomiting  Post-op Vital Signs: Reviewed and stable  Complications: No notable events documented.

## 2022-08-05 NOTE — Anesthesia Postprocedure Evaluation (Signed)
Anesthesia Post Note  Patient: Gina Harper  Procedure(s) Performed: CATARACT EXTRACTION PHACO AND INTRAOCULAR LENS PLACEMENT (IOC) LEFT  8.47  01:06.2 (Left: Eye)  Patient location during evaluation: PACU Anesthesia Type: MAC Level of consciousness: awake and alert Pain management: pain level controlled Vital Signs Assessment: post-procedure vital signs reviewed and stable Respiratory status: spontaneous breathing, nonlabored ventilation, respiratory function stable and patient connected to nasal cannula oxygen Cardiovascular status: stable and blood pressure returned to baseline Postop Assessment: no apparent nausea or vomiting Anesthetic complications: no   No notable events documented.   Last Vitals:  Vitals:   08/05/22 0844 08/05/22 0849  BP: 122/73 (!) 144/76  Pulse: 66 66  Resp: 14 11  Temp: (!) 36.2 C (!) 36.2 C  SpO2: 96% 94%    Last Pain:  Vitals:   08/05/22 0849  TempSrc:   PainSc: 0-No pain                 Marisue Humble

## 2022-08-05 NOTE — Telephone Encounter (Signed)
PT states she got a call from [place] or Ms. Cobb about results but I see no open encounters. Please call PT back to advise @ [phone]

## 2022-08-05 NOTE — Telephone Encounter (Signed)
Sleep study scanned into Epic but I didn't call the patient

## 2022-08-05 NOTE — H&P (Signed)
[facility]   Primary Care Physician:  Excell Seltzer, MD Ophthalmologist: Dr. Lockie Mola  Pre-Procedure History & Physical: HPI:  Gina Harper is a 72 y.o. female here for ophthalmic surgery.   Past Medical History:  Diagnosis Date   Abdominal epilepsy    rectal spasm,syncope, nausea, ongoing for years   Allergic rhinitis, cause unspecified    Allergy    Alopecia, unspecified    Anemia    pernicious anemia   Arthritis    hands, knees, hips   Asthma    Cancer    melanoma   Chicken pox    Depressive disorder, not elsewhere classified    Disorder of bone and cartilage, unspecified    Esophageal reflux    GERD (gastroesophageal reflux disease)    Hepatitis    History of shingles    Insomnia, unspecified    Measles    red measles   Motion sickness    Mumps    Other abnormal glucose    Palpitations    Syncope, vasovagal    Tobacco use disorder    Unspecified asthma(493.90)    exercise induced. Never hospitalized   Unspecified essential hypertension    Unspecified hearing loss    Unspecified pruritic disorder    Wears hearing aid in both ears     Past Surgical History:  Procedure Laterality Date   bladder tact  1995   BROW LIFT Bilateral 07/09/2015   Procedure: BLEPHAROPLASTY;  Surgeon: Imagene Riches, MD;  Location: [facility];  Service: Ophthalmology;  Laterality: Bilateral;   BUNIONECTOMY  [id number]   bilateral   colonoscopy  04/20/2004   no polyps; normal.  Repeat in 10 years.  [place].   COLONOSCOPY WITH PROPOFOL N/A 05/20/2015   Procedure: COLONOSCOPY WITH PROPOFOL;  Surgeon: Christena Deem, MD;  Location: [facility];  Service: Endoscopy;  Laterality: N/A;   COLONOSCOPY WITH PROPOFOL N/A 09/25/2020   Procedure: COLONOSCOPY WITH PROPOFOL;  Surgeon: Toledo, Boykin Nearing, MD;  Location: [facility] END[facility];  Service: Gastroenterology;  Laterality: N/A;   COSMETIC SURGERY     rhinoplasty   ESOPHAGOGASTRODUODENOSCOPY  03/30/2005   PTOSIS  REPAIR Bilateral 07/09/2015   Procedure: PTOSIS REPAIR;  Surgeon: Imagene Riches, MD;  Location: [facility];  Service: Ophthalmology;  Laterality: Bilateral;   rhinoplasty with septoplasty     TONSILLECTOMY AND ADENOIDECTOMY     TUBAL LIGATION     VAGINAL HYSTERECTOMY  1997   menorrhagia ovaries intact    Prior to Admission medications   Medication Sig Start Date End Date Taking? Authorizing Provider  albuterol (PROAIR HFA) 108 (90 Base) MCG/ACT inhaler Inhale 2 puffs into the lungs every 6 (six) hours as needed. 03/26/21  Yes Eden Emms, NP  atorvastatin (LIPITOR) 10 MG tablet TAKE 1 TABLET BY MOUTH DAILY 06/17/22  Yes Bedsole, Amy E, MD  Biotin 16109 MCG TABS Take 1 tablet by mouth daily.   Yes [provider]  cetirizine (ZYRTEC) 10 MG tablet Take 10 mg by mouth as needed.    Yes [provider]  Cholecalciferol (VITAMIN D3) 2000 UNITS TABS Take by mouth daily.    Yes [provider]  docusate sodium (COLACE) 100 MG capsule Take 100 mg by mouth daily.   Yes [provider]  FLUAD QUADRIVALENT 0.5 ML injection  02/22/22  Yes [provider]  FLUoxetine (PROZAC) 10 MG capsule TAKE 1 CAPSULE BY MOUTH DAILY 04/30/22  Yes Bedsole, Amy E, MD  hydrochlorothiazide (HYDRODIURIL)  25 MG tablet TAKE 1 TABLET BY MOUTH DAILY 06/17/22  Yes Bedsole, Amy E, MD  Magnesium 250 MG TABS Take 250 mg by mouth daily.   Yes [provider]  Multiple Vitamin (MULTI-VITAMINS) TABS Take by mouth daily.   Yes [provider]  Potassium Gluconate 550 MG TABS Take by mouth daily.    Yes [provider]  promethazine (PHENERGAN) 25 MG suppository Place 1 suppository (25 mg total) rectally every 6 (six) hours as needed. 03/26/21  Yes Eden Emms, NP  vitamin B-12 (CYANOCOBALAMIN) 1000 MCG tablet Take 1,000 mcg by mouth daily.   Yes [provider]    Allergies as of 07/22/2022 - Review Complete 05/26/2022  Allergen Reaction Noted    Cefdinir Hives 07/04/2014   Lisinopril Cough and Other (See Comments) 01/31/2014   Meloxicam Swelling 08/15/2012   Omnipen [ampicillin] Other (See Comments) 08/15/2012   Augmentin [amoxicillin-pot clavulanate] Rash 02/08/2012   Clarithromycin Rash 07/04/2014    Family History  Problem Relation Age of Onset   Cancer Mother        Lung, Bladder   Stroke Father 23       TIAs   Heart disease Father 27       AMI x 2.   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  Tobacco Use   Smoking status: Former    Packs/day: 2.00    Years: 17.00    Additional pack years: 0.00    Total pack years: 34.00    Types: Cigarettes    Quit date: 05/22/1988    Years since quitting: 34.2   Smokeless tobacco: Never   Tobacco comments:    Quit 22 years ago, 1990  Vaping Use   Vaping Use: Never used  Substance and Sexual Activity   Alcohol use: Yes    Alcohol/week: 6.0 standard drinks of alcohol    Types: 6 Standard drinks or equivalent per week    Comment: moderate white wine 5 glasses per week   Drug use: No   Sexual activity: Yes    Partners: Male    Birth control/protection: Post-menopausal, Surgical  Other Topics Concern   Not on file  Social History Narrative      Marital status:  Married x 40 years, happily married; no domestic abuse.      Children:  1 child; 3 grandchildren local.      Employment:  Retired in 09/2012; [employer] x 21 years; happy.  Pre-admission testing.      Tobacco:  Previous smoker. Quit 1990      Alcohol:  2 glasses of wine three nights per week.      Drugs; none      Exercise:  Treadmill,  elliptical, Zumba.  [employer] 3x per week.      Seatbelt:  Always uses seat belts.       +Smoke alarm and carbon monoxide detector in the home.       Guns:  Guns stored in locked cabinet.  Caffeine use: Coffee, Tea, Carbonated beverages, 3 servings / day.                 Social Determinants of Health   Financial Resource Strain: Low Risk  (04/24/2022)   Overall Financial Resource Strain (CARDIA)    Difficulty of Paying Living Expenses: Not hard at all  Food Insecurity: No Food Insecurity (04/24/2022)   Hunger Vital Sign    Worried About Running Out of Food in the Last Year: Never true    Ran Out of Food in the Last Year: Never true  Transportation Needs: No Transportation Needs (04/24/2022)   PRAPARE - Administrator, Civil Service (Medical): No    Lack of Transportation (Non-Medical): No  Physical Activity: Sufficiently Active (04/24/2022)   Exercise Vital Sign    Days of Exercise per Week: 5 days    Minutes of Exercise per Session: 30 min  Stress: No Stress Concern Present (04/24/2022)   [employer] of Occupational Health - Occupational Stress Questionnaire    Feeling of Stress : Not at all  Social Connections: Moderately Integrated (04/24/2022)   Social Connection and Isolation Panel [NHANES]    Frequency of Communication with Friends and Family: More than three times a week    Frequency of Social Gatherings with Friends and Family: More than three times a week    Attends Religious Services: Never    Database administrator or Organizations: Yes    Attends Engineer, structural: More than 4 times per year    Marital Status: Married  Catering manager Violence: Not At Risk (04/24/2022)   Humiliation, Afraid, Rape, and Kick questionnaire    Fear of Current or Ex-Partner: No    Emotionally Abused: No    Physically Abused: No    Sexually Abused: No    Review of Systems: See HPI, otherwise negative ROS  Physical Exam: BP 139/79   Pulse 67   Temp (!) 97.3 F  (36.3 C) (Temporal)   Resp 15   Ht 5\' 2"  (1.575 m)   Wt 75.7 kg   SpO2 100%   BMI 30.51 kg/m  General:   Alert,  pleasant and cooperative in NAD Head:  Normocephalic and atraumatic. Lungs:  Clear to auscultation.    Heart:  Regular rate and rhythm.   Impression/Plan: Gina Harper is here for ophthalmic surgery.  Risks, benefits, limitations, and alternatives regarding ophthalmic surgery have been reviewed with the patient.  Questions have been answered.  All parties agreeable.   Lockie Mola, MD  08/05/2022, 8:00 AM

## 2022-08-05 NOTE — Telephone Encounter (Signed)
Lm for patient.  

## 2022-08-05 NOTE — Op Note (Signed)
  OPERATIVE NOTE  NECOLE MINASSIAN [id number] 08/05/2022   PREOPERATIVE DIAGNOSIS:  Nuclear sclerotic cataract left eye. H25.12   POSTOPERATIVE DIAGNOSIS:    Nuclear sclerotic cataract left eye.     PROCEDURE:  Phacoemusification with posterior chamber intraocular lens placement of the left eye  Ultrasound time: Procedure(s): CATARACT EXTRACTION PHACO AND INTRAOCULAR LENS PLACEMENT (IOC) LEFT  8.47  01:06.2 (Left)  LENS:   Implant Name Type Inv. Item Serial No. Manufacturer Lot No. LRB No. Used Action  LENS CLAREON 20.5 - [id number] Intraocular Lens LENS CLAREON 20.5 [id number] SIGHTPATH  Left 1 Implanted    [id number]  SURGEON:  Deirdre Evener, MD   ANESTHESIA:  Topical with tetracaine drops and 2% Xylocaine jelly, augmented with 1% preservative-free intracameral lidocaine.    COMPLICATIONS:  None.   DESCRIPTION OF PROCEDURE:  The patient was identified in the holding room and transported to the operating room and placed in the supine position under the operating microscope.  The left eye was identified as the operative eye and it was prepped and draped in the usual sterile ophthalmic fashion.   A 1 millimeter clear-corneal paracentesis was made at the 1:30 position.  0.5 ml of preservative-free 1% lidocaine was injected into the anterior chamber.  The anterior chamber was filled with Viscoat viscoelastic.  A 2.4 millimeter keratome was used to make a near-clear corneal incision at the 10:30 position.  .  A curvilinear capsulorrhexis was made with a cystotome and capsulorrhexis forceps.  Balanced salt solution was used to hydrodissect and hydrodelineate the nucleus.   Phacoemulsification was then used in stop and chop fashion to remove the lens nucleus and epinucleus.  The remaining cortex was then removed using the irrigation and aspiration handpiece. Provisc was then placed into the capsular bag to distend it for lens placement.  A lens was then injected into the capsular bag.   The remaining viscoelastic was aspirated.   Wounds were hydrated with balanced salt solution.  The anterior chamber was inflated to a physiologic pressure with balanced salt solution.  No wound leaks were noted. Vigamox 0.2 ml of a  per ml solution was injected into the anterior chamber for a dose of 0.2 mg of intracameral antibiotic at the completion of the case.   Timolol and Brimonidine drops were applied to the eye.  The patient was taken to the recovery room in stable condition without complications of anesthesia or surgery.  Cayden Granholm 08/05/2022, 8:44 AM

## 2022-08-06 ENCOUNTER — Encounter: Payer: Self-pay | Admitting: Ophthalmology

## 2022-08-06 DIAGNOSIS — H2511 Age-related nuclear cataract, right eye: Secondary | ICD-10-CM | POA: Diagnosis not present

## 2022-08-06 NOTE — Telephone Encounter (Signed)
Appt scheduled 08/18/2022 at 9:30. Will close encounter.

## 2022-08-18 ENCOUNTER — Ambulatory Visit (INDEPENDENT_AMBULATORY_CARE_PROVIDER_SITE_OTHER): Payer: Medicare Other

## 2022-08-18 ENCOUNTER — Ambulatory Visit: Payer: Medicare Other | Admitting: Nurse Practitioner

## 2022-08-18 ENCOUNTER — Encounter: Payer: Self-pay | Admitting: Nurse Practitioner

## 2022-08-18 VITALS — BP 134/78 | HR 67 | Temp 98.4°F | Ht 62.0 in | Wt 163.4 lb

## 2022-08-18 DIAGNOSIS — R0683 Snoring: Secondary | ICD-10-CM | POA: Diagnosis not present

## 2022-08-18 DIAGNOSIS — J019 Acute sinusitis, unspecified: Secondary | ICD-10-CM | POA: Diagnosis not present

## 2022-08-18 DIAGNOSIS — J329 Chronic sinusitis, unspecified: Secondary | ICD-10-CM | POA: Insufficient documentation

## 2022-08-18 DIAGNOSIS — J4531 Mild persistent asthma with (acute) exacerbation: Secondary | ICD-10-CM | POA: Insufficient documentation

## 2022-08-18 DIAGNOSIS — G4733 Obstructive sleep apnea (adult) (pediatric): Secondary | ICD-10-CM | POA: Diagnosis not present

## 2022-08-18 DIAGNOSIS — R059 Cough, unspecified: Secondary | ICD-10-CM | POA: Diagnosis not present

## 2022-08-18 LAB — CBC WITH DIFFERENTIAL/PLATELET
Basophils Absolute: 0 10*3/uL (ref 0.0–0.1)
Basophils Relative: 0.6 % (ref 0.0–3.0)
Eosinophils Absolute: 0.1 10*3/uL (ref 0.0–0.7)
Eosinophils Relative: 1.7 % (ref 0.0–5.0)
HCT: 41.2 % (ref 36.0–46.0)
Hemoglobin: 14.1 g/dL (ref 12.0–15.0)
Lymphocytes Relative: 35.8 % (ref 12.0–46.0)
Lymphs Abs: 2.8 10*3/uL (ref 0.7–4.0)
MCHC: 34.2 g/dL (ref 30.0–36.0)
MCV: 94.2 fl (ref 78.0–100.0)
Monocytes Absolute: 0.5 10*3/uL (ref 0.1–1.0)
Monocytes Relative: 6 % (ref 3.0–12.0)
Neutro Abs: 4.3 10*3/uL (ref 1.4–7.7)
Neutrophils Relative %: 55.9 % (ref 43.0–77.0)
Platelets: 228 10*3/uL (ref 150.0–400.0)
RBC: 4.38 Mil/uL (ref 3.87–5.11)
RDW: 12.9 % (ref 11.5–15.5)
WBC: 7.8 10*3/uL (ref 4.0–10.5)

## 2022-08-18 MED ORDER — PREDNISONE 20 MG PO TABS
20.0000 mg | ORAL_TABLET | Freq: Every day | ORAL | 0 refills | Status: AC
Start: 2022-08-18 — End: 2022-08-23

## 2022-08-18 MED ORDER — ALBUTEROL SULFATE HFA 108 (90 BASE) MCG/ACT IN AERS
2.0000 | INHALATION_SPRAY | Freq: Four times a day (QID) | RESPIRATORY_TRACT | 2 refills | Status: DC | PRN
Start: 2022-08-18 — End: 2023-10-28

## 2022-08-18 MED ORDER — FLUTICASONE PROPIONATE 50 MCG/ACT NA SUSP: 1.0000 | Freq: Every day | NASAL | 2 refills | Status: DC

## 2022-08-18 MED ORDER — DOXYCYCLINE HYCLATE 100 MG PO TABS
100.0000 mg | ORAL_TABLET | Freq: Two times a day (BID) | ORAL | 0 refills | Status: AC
Start: 2022-08-18 — End: 2022-08-25

## 2022-08-18 NOTE — Assessment & Plan Note (Signed)
Asthmatic bronchitis secondary to URI vs allergies. Unresolved symptoms after 1 month. Elevated exhalex nitric oxide testing today - 26 ppb. CXR today to rule out superimposed infection. We will treat her with empiric course of doxycycline and prednisone burst. CBC with diff and allergen panel to assess for eosinophilia/allergic component. I have refilled her SABA for rescue use. Action plan in place. She does have a significant smoking history so possible she has a component of smoking related obstructive lung disease. She has never had PFTs before. We will set her up for these today.

## 2022-08-18 NOTE — Assessment & Plan Note (Signed)
Empiric doxycycline. Target postnasal drainage with intranasal steroid. See above.

## 2022-08-18 NOTE — Patient Instructions (Addendum)
Start 5-20 cmH2O CPAP every night, minimum of 4-6 hours a night.  Change equipment every 30 days or as directed by DME. Wash your tubing with warm soap and water daily, hang to dry. Wash humidifier portion weekly.  Be aware of reduced alertness and do not drive or operate heavy machinery if experiencing this or drowsiness.  Exercise encouraged, as tolerated. Notify if persistent daytime sleepiness occurs even with consistent use of CPAP.  We discussed how untreated sleep apnea puts an individual at risk for cardiac arrhthymias, pulm HTN, DM, stroke and increases their risk for daytime accidents. We also briefly reviewed treatment options including weight loss, side sleeping position, oral appliance, CPAP therapy or referral to ENT for possible surgical options  Doxycycline 1 tab Twice daily for 7 days. Take with food. Wear sunscreen when you are taking this as it can increase your risk for sunburns  Flonase nasal spray 2 sprays each nostril daily for nasal congestion/drainage  Prednisone 20 mg daily for 5 days. Take in AM with food  I refilled your albuterol inhaler - 2 puffs every 6 hours as needed for shortness of breath or wheezing   Chest x ray today Labs today  Follow up in 12 weeks with Dr. Wynona Neat (1st) after PFTs and to see how CPAP therapy is going. If symptoms do not improve or worsen, please contact office for sooner follow up or seek emergency care.

## 2022-08-18 NOTE — Discharge Instructions (Signed)

## 2022-08-18 NOTE — Progress Notes (Signed)
@Patient  ID: Gina Harper, female    DOB: Aug 18, 1950, 72 y.o.   MRN: [id number]  Chief Complaint  Patient presents with   Follow-up    Hst results and options     Referring provider: Excell Seltzer, MD  HPI: 72 year old female, former smoker referred for sleep consult. Past medical history significant for HTN, exercise induced asthma, lung nodule (considered benign), GERD, MDD, HLD, prediabetes, insomnia.   TEST/EVENTS:  07/19/2022 HST: AHI 52.9/h, SpO2 low 79%  05/26/2022: OV with Hiromi Knodel NP for sleep consult, referred by Dr. Ermalene Searing. She has had trouble with her sleep and feeling tired for many years now. She has trouble with falling asleep and wakes frequently throughout the night. When she gets up in the morning, she's tired and feelings fatigued throughout the day. She has been told she snores and has even woken herself up snoring. There are a lot of mornings she wakes up with morning headaches and/or dry mouth. She has had some drowsy driving in the past but not recently. Some sleep talking; never has had any sleep walking. No witnessed apneas. No history of narcolepsy and no symptoms of cataplexy. She goes to bed between 10-11 pm. Takes 1-2 hours to fall asleep. Has tried melatonin in the past with little success. She wakes 3-4 times a night. Gets up between 830-9 am. She is retired. Weight is down 10 lb in the past 2 years. Never had a previous sleep study. She has a history of HTN, exercise induced asthma which is well-controlled on PRN SABA with rare use, allergies. She has a history of melanoma as well, cured. She is a former smoker; quit 1990 with 34 pack year history. She drinks 1-2 glasses of wine a week. She lives at home with her husband. Family history of emphysema, allergies, heart disease and cancer.  [place] 22  08/18/2022: Today - follow up Patient presents today for follow up to discuss sleep study which revealed severe OSA. She continues to feeling tired during the day and  wakes feeling poorly rested. Sleep habits are unchanged. No recent trouble with drowsy driving. Her husband has a CPAP so she is familiar with therapy. Would be open to starting therapy herself.  She is also concerned because she has been struggling with a productive cough for the past month. Initially started as allergies but then she thinks she may have had a cold. Seems to have traveled to her chest. Her phlegm is green. Does have any occasional wheeze. She has chills at times but no fevers >100.4. No trouble with her breathing. Only gets winded with strenuous exercise, which is baseline for her. She does have sinus drainage and congestion. Denies any hemoptysis, orthopnea, leg swelling, anorexia, CP. Eating and drinking well. She used to have a rescue inhaler for asthma but has not had this filled in a few years. She takes Zyrtec daily for allergies. She is a former heavy smoker with 34 pack year history; quit in 1990.   Allergies  Allergen Reactions   Cefdinir Hives   Lisinopril Cough and Other (See Comments)   Meloxicam Swelling   Omnipen [Ampicillin] Other (See Comments)   Augmentin [Amoxicillin-Pot Clavulanate] Rash   Clarithromycin Rash    Immunization History  Administered Date(s) Administered   Fluad Quad(high Dose 65+) 01/31/2019   Influenza Split 01/19/2011   Influenza, High Dose Seasonal PF 02/10/2021   Influenza,inj,Quad PF,6+ Mos 02/17/2013, 01/14/2016, 01/26/2017, 01/28/2018   Influenza-Unspecified 02/14/2014, 02/01/2015, 02/02/2020, 02/22/2022   PFIZER(Purple  Top)SARS-COV-2 Vaccination 05/26/2019, 06/21/2019, 04/29/2020   Pneumococcal Conjugate-13 07/02/2015   Pneumococcal Polysaccharide-23 01/26/2017   Tdap 04/20/2008   Zoster, Live 04/17/2013    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HCC)    rectal spasm,syncope, nausea, ongoing for years   Allergic rhinitis, cause unspecified    Allergy    Alopecia, unspecified    Anemia    pernicious  anemia   Arthritis    hands, knees, hips   Asthma    Cancer (HCC)    melanoma   Chicken pox    Depressive disorder, not elsewhere classified    Disorder of bone and cartilage, unspecified    Esophageal reflux    GERD (gastroesophageal reflux disease)    Hepatitis    History of shingles    Insomnia, unspecified    Measles    red measles   Motion sickness    Mumps    Other abnormal glucose    Palpitations    Syncope, vasovagal    Tobacco use disorder    Unspecified asthma(493.90)    exercise induced. Never hospitalized   Unspecified essential hypertension    Unspecified hearing loss    Unspecified pruritic disorder    Wears hearing aid in both ears     Tobacco History: Social History   Tobacco Use  Smoking Status Former   Packs/day: 2.00   Years: 17.00   Additional pack years: 0.00   Total pack years: 34.00   Types: Cigarettes   Quit date: 05/22/1988   Years since quitting: 34.2  Smokeless Tobacco Never  Tobacco Comments   Quit 22 years ago, 1990   Counseling given: Not Answered Tobacco comments: Quit 22 years ago, 1990   Outpatient Medications Prior to Visit  Medication Sig Dispense Refill   atorvastatin (LIPITOR) 10 MG tablet TAKE 1 TABLET BY MOUTH DAILY 90 tablet 3   Biotin 24401 MCG TABS Take 1 tablet by mouth daily.     cetirizine (ZYRTEC) 10 MG tablet Take 10 mg by mouth as needed.      Cholecalciferol (VITAMIN D3) 2000 UNITS TABS Take by mouth daily.      docusate sodium (COLACE) 100 MG capsule Take 100 mg by mouth daily.     FLUAD QUADRIVALENT 0.5 ML injection      FLUoxetine (PROZAC) 10 MG capsule TAKE 1 CAPSULE BY MOUTH DAILY 100 capsule 1   hydrochlorothiazide (HYDRODIURIL) 25 MG tablet TAKE 1 TABLET BY MOUTH DAILY 90 tablet 3   Magnesium 250 MG TABS Take 250 mg by mouth daily.     Multiple Vitamin (MULTI-VITAMINS) TABS Take by mouth daily.     Potassium Gluconate 550 MG TABS Take by mouth daily.      promethazine (PHENERGAN) 25 MG suppository  Place 1 suppository (25 mg total) rectally every 6 (six) hours as needed. 12 each 1   vitamin B-12 (CYANOCOBALAMIN) 1000 MCG tablet Take 1,000 mcg by mouth daily.     albuterol (PROAIR HFA) 108 (90 Base) MCG/ACT inhaler Inhale 2 puffs into the lungs every 6 (six) hours as needed. 8 g 0   No facility-administered medications prior to visit.     Review of Systems:   Constitutional: No weight loss or gain, night sweats, fevers, or lassitude. +excessive daytime fatigue, occasional chills HEENT: No difficulty swallowing, tooth/dental problems, or sore throat. No sneezing, itching, ear ache. +headaches, dry mouth, nasal congestion/drainage CV:  No chest pain, orthopnea, PND, swelling in lower extremities, anasarca, dizziness, palpitations, syncope Resp: +  snoring; shortness of breath with strenuous activity (baseline); productive cough; occasional wheeze. No hemoptysis. No chest wall deformity GI:  +occasional heartburn, indigestion. No abdominal pain, nausea, vomiting, diarrhea, change in bowel habits, loss of appetite GU: No dysuria, change in color of urine, urgency or frequency.  Skin: No rash, lesions, ulcerations MSK:  No joint pain or swelling.   Neuro: No dizziness or lightheadedness.  Psych: No depression or anxiety. Mood stable. +sleep disturbance    Physical Exam:  BP 134/78   Pulse 67   Temp 98.4 F (36.9 C) (Oral)   Ht 5\' 2"  (1.575 m)   Wt 163 lb 6.4 oz (74.1 kg)   SpO2 97%   BMI 29.89 kg/m   GEN: Pleasant, interactive, well-appearing; obese; in no acute distress. HEENT:  Normocephalic and atraumatic. PERRLA. Sclera white. Nasal turbinates erythematous, moist and patent bilaterally. No rhinorrhea present. Oropharynx pink and moist, without exudate or edema. No lesions, ulcerations, or postnasal drip. Mallampati II/III NECK:  Supple w/ fair ROM. No JVD present. Normal carotid impulses w/o bruits. Thyroid symmetrical with no goiter or nodules palpated. No lymphadenopathy.    CV: RRR, no m/r/g, no peripheral edema. Pulses intact, +2 bilaterally. No cyanosis, pallor or clubbing. PULMONARY:  Unlabored, regular breathing. Clear bilaterally A&P w/o wheezes/rales/rhonchi. No accessory muscle use.  GI: BS present and normoactive. Soft, non-tender to palpation. No organomegaly or masses detected.  MSK: No erythema, warmth or tenderness. Cap refil <2 sec all extrem. No deformities or joint swelling noted.  Neuro: A/Ox3. No focal deficits noted.   Skin: Warm, no lesions or rashe Psych: Normal affect and behavior. Judgement and thought content appropriate.     Lab Results:  CBC    Component Value Date/Time   WBC 9.2 02/25/2019 0840   RBC 4.09 02/25/2019 0840   HGB 12.9 02/25/2019 0840   HGB 13.5 09/30/2011 0805   HCT 37.6 02/25/2019 0840   HCT 39.9 09/30/2011 0805   PLT 269 02/25/2019 0840   PLT 241 09/30/2011 0805   MCV 91.9 02/25/2019 0840   MCV 94 09/30/2011 0805   MCH 31.5 02/25/2019 0840   MCHC 34.3 02/25/2019 0840   RDW 12.8 02/25/2019 0840   RDW 12.7 09/30/2011 0805   LYMPHSABS 2.4 09/07/2014 0902   LYMPHSABS 2.6 09/30/2011 0805   MONOABS 0.5 09/07/2014 0902   MONOABS 0.6 09/30/2011 0805   EOSABS 0.1 09/07/2014 0902   EOSABS 0.1 09/30/2011 0805   BASOSABS 0.0 09/07/2014 0902   BASOSABS 0.1 09/30/2011 0805    BMET    Component Value Date/Time   NA 142 04/23/2022 0844   NA 138 09/30/2011 0805   K 3.7 04/23/2022 0844   K 3.5 09/30/2011 0805   CL 104 04/23/2022 0844   CL 103 09/30/2011 0805   CO2 31 04/23/2022 0844   CO2 28 09/30/2011 0805   GLUCOSE 113 (H) 04/23/2022 0844   GLUCOSE 101 (H) 09/30/2011 0805   BUN 15 04/23/2022 0844   BUN 16 09/30/2011 0805   CREATININE 0.66 04/23/2022 0844   CREATININE 0.71 01/31/2014 1026   CALCIUM 9.5 04/23/2022 0844   CALCIUM 9.1 09/30/2011 0805   GFRNONAA >60 02/25/2019 0840   GFRNONAA >89 01/31/2014 1026   GFRAA >60 02/25/2019 0840   GFRAA >89 01/31/2014 1026    BNP No results found for:  "BNP"   Imaging:  DG Chest 2 View  Result Date: 08/18/2022 CLINICAL DATA:  Productive cough, wheeze. EXAM: CHEST - 2 VIEW COMPARISON:  Chest radiograph 02/25/2019, CT  chest 04/23/2020 FINDINGS: The cardiomediastinal silhouette is normal There are streaky perihilar opacities with some peribronchial cuffing. There is no focal consolidation or pulmonary edema. There is no pleural effusion or pneumothorax There is no acute osseous abnormality. IMPRESSION: Streaky perihilar opacities with some peribronchial cuffing may reflect bronchitis. Electronically Signed   By: Lesia Hausen M.D.   On: 08/18/2022 10:08          No data to display          No results found for: "NITRICOXIDE"      Assessment & Plan:   Severe obstructive sleep apnea Severe OSA with AHI 52.9/h. We reviewed potential treatment options and risks of untreated OSA. She is willing to move forward with CPAP therapy. New start auto CPAP 5-20 cmH2O with mask of choice sent to DME. Educated on proper care/use. Reviewed risks/benefits. Cautioned on safe driving practices.  Patient Instructions  Start 5-20 cmH2O CPAP every night, minimum of 4-6 hours a night.  Change equipment every 30 days or as directed by DME. Wash your tubing with warm soap and water daily, hang to dry. Wash humidifier portion weekly.  Be aware of reduced alertness and do not drive or operate heavy machinery if experiencing this or drowsiness.  Exercise encouraged, as tolerated. Notify if persistent daytime sleepiness occurs even with consistent use of CPAP.  We discussed how untreated sleep apnea puts an individual at risk for cardiac arrhthymias, pulm HTN, DM, stroke and increases their risk for daytime accidents. We also briefly reviewed treatment options including weight loss, side sleeping position, oral appliance, CPAP therapy or referral to ENT for possible surgical options  Doxycycline 1 tab Twice daily for 7 days. Take with food. Wear sunscreen  when you are taking this as it can increase your risk for sunburns  Flonase nasal spray 2 sprays each nostril daily for nasal congestion/drainage  Prednisone 20 mg daily for 5 days. Take in AM with food  I refilled your albuterol inhaler - 2 puffs every 6 hours as needed for shortness of breath or wheezing   Chest x ray today Labs today  Follow up in 12 weeks with Dr. Wynona Neat (1st) after PFTs and to see how CPAP therapy is going. If symptoms do not improve or worsen, please contact office for sooner follow up or seek emergency care.   Mild persistent asthmatic bronchitis with exacerbation Asthmatic bronchitis secondary to URI vs allergies. Unresolved symptoms after 1 month. Elevated exhalex nitric oxide testing today - 26 ppb. CXR today to rule out superimposed infection. We will treat her with empiric course of doxycycline and prednisone burst. CBC with diff and allergen panel to assess for eosinophilia/allergic component. I have refilled her SABA for rescue use. Action plan in place. She does have a significant smoking history so possible she has a component of smoking related obstructive lung disease. She has never had PFTs before. We will set her up for these today.    Acute sinusitis Empiric doxycycline. Target postnasal drainage with intranasal steroid. See above.     I spent 42 minutes of dedicated to the care of this patient on the date of this encounter to include pre-visit review of records, face-to-face time with the patient discussing conditions above, post visit ordering of testing, clinical documentation with the electronic health record, making appropriate referrals as documented, and communicating necessary findings to members of the patients care team.  Noemi Chapel, NP 08/18/2022  Pt aware and understands NP's role.

## 2022-08-18 NOTE — Assessment & Plan Note (Signed)
Severe OSA with AHI 52.9/h. We reviewed potential treatment options and risks of untreated OSA. She is willing to move forward with CPAP therapy. New start auto CPAP 5-20 cmH2O with mask of choice sent to DME. Educated on proper care/use. Reviewed risks/benefits. Cautioned on safe driving practices.  Patient Instructions  Start 5-20 cmH2O CPAP every night, minimum of 4-6 hours a night.  Change equipment every 30 days or as directed by DME. Wash your tubing with warm soap and water daily, hang to dry. Wash humidifier portion weekly.  Be aware of reduced alertness and do not drive or operate heavy machinery if experiencing this or drowsiness.  Exercise encouraged, as tolerated. Notify if persistent daytime sleepiness occurs even with consistent use of CPAP.  We discussed how untreated sleep apnea puts an individual at risk for cardiac arrhthymias, pulm HTN, DM, stroke and increases their risk for daytime accidents. We also briefly reviewed treatment options including weight loss, side sleeping position, oral appliance, CPAP therapy or referral to ENT for possible surgical options  Doxycycline 1 tab Twice daily for 7 days. Take with food. Wear sunscreen when you are taking this as it can increase your risk for sunburns  Flonase nasal spray 2 sprays each nostril daily for nasal congestion/drainage  Prednisone 20 mg daily for 5 days. Take in AM with food  I refilled your albuterol inhaler - 2 puffs every 6 hours as needed for shortness of breath or wheezing   Chest x ray today Labs today  Follow up in 12 weeks with Dr. Wynona Neat (1st) after PFTs and to see how CPAP therapy is going. If symptoms do not improve or worsen, please contact office for sooner follow up or seek emergency care.

## 2022-08-19 ENCOUNTER — Ambulatory Visit: Payer: Medicare Other | Admitting: Anesthesiology

## 2022-08-19 ENCOUNTER — Encounter
Admission: RE | Disposition: A | Discharge status: Home / Self Care | Payer: Self-pay | Source: Home / Self Care | Attending: Ophthalmology

## 2022-08-19 ENCOUNTER — Encounter: Payer: Self-pay | Admitting: Ophthalmology

## 2022-08-19 ENCOUNTER — Ambulatory Visit
Admission: RE | Admit: 2022-08-19 | Discharge: 2022-08-19 | Disposition: A | Discharge status: Home / Self Care | Payer: Medicare Other | Attending: Ophthalmology | Admitting: Ophthalmology

## 2022-08-19 ENCOUNTER — Other Ambulatory Visit: Payer: Self-pay

## 2022-08-19 DIAGNOSIS — G4730 Sleep apnea, unspecified: Secondary | ICD-10-CM | POA: Diagnosis not present

## 2022-08-19 DIAGNOSIS — Z87891 Personal history of nicotine dependence: Secondary | ICD-10-CM | POA: Diagnosis not present

## 2022-08-19 DIAGNOSIS — I10 Essential (primary) hypertension: Secondary | ICD-10-CM | POA: Insufficient documentation

## 2022-08-19 DIAGNOSIS — H2511 Age-related nuclear cataract, right eye: Secondary | ICD-10-CM | POA: Diagnosis not present

## 2022-08-19 DIAGNOSIS — J45909 Unspecified asthma, uncomplicated: Secondary | ICD-10-CM | POA: Insufficient documentation

## 2022-08-19 DIAGNOSIS — F32A Depression, unspecified: Secondary | ICD-10-CM | POA: Insufficient documentation

## 2022-08-19 DIAGNOSIS — H269 Unspecified cataract: Secondary | ICD-10-CM | POA: Diagnosis not present

## 2022-08-19 HISTORY — PX: CATARACT EXTRACTION W/PHACO: SHX586

## 2022-08-19 SURGERY — PHACOEMULSIFICATION, CATARACT, WITH IOL INSERTION
Anesthesia: Monitor Anesthesia Care | Site: Eye | Laterality: Right

## 2022-08-19 MED ORDER — SIGHTPATH DOSE#1 BSS IO SOLN
INTRAOCULAR | Status: DC | PRN
  Administered 2022-08-19: 58 mL via OPHTHALMIC

## 2022-08-19 MED ORDER — SIGHTPATH DOSE#1 BSS IO SOLN
INTRAOCULAR | Status: DC | PRN
  Administered 2022-08-19: 1 mL via INTRAMUSCULAR

## 2022-08-19 MED ORDER — SIGHTPATH DOSE#1 BSS IO SOLN
INTRAOCULAR | Status: DC | PRN
  Administered 2022-08-19: 15 mL

## 2022-08-19 MED ORDER — LACTATED RINGERS IV SOLN: INTRAVENOUS | Status: DC

## 2022-08-19 MED ORDER — MIDAZOLAM HCL 2 MG/2ML IJ SOLN
INTRAMUSCULAR | Status: DC | PRN
  Administered 2022-08-19: 2 mg via INTRAVENOUS

## 2022-08-19 MED ORDER — TETRACAINE HCL 0.5 % OP SOLN
1.0000 [drp] | OPHTHALMIC | Status: DC | PRN
  Administered 2022-08-19 (×3): 1 [drp] via OPHTHALMIC

## 2022-08-19 MED ORDER — BRIMONIDINE TARTRATE-TIMOLOL 0.2-0.5 % OP SOLN
OPHTHALMIC | Status: DC | PRN
  Administered 2022-08-19: 1 [drp] via OPHTHALMIC

## 2022-08-19 MED ORDER — FENTANYL CITRATE (PF) 100 MCG/2ML IJ SOLN
INTRAMUSCULAR | Status: DC | PRN
  Administered 2022-08-19 (×2): 50 ug via INTRAVENOUS

## 2022-08-19 MED ORDER — SIGHTPATH DOSE#1 NA HYALUR & NA CHOND-NA HYALUR IO KIT
PACK | INTRAOCULAR | Status: DC | PRN
  Administered 2022-08-19: 1 via OPHTHALMIC

## 2022-08-19 MED ORDER — ARMC OPHTHALMIC DILATING DROPS
1.0000 | OPHTHALMIC | Status: DC | PRN
  Administered 2022-08-19 (×3): 1 via OPHTHALMIC

## 2022-08-19 MED ORDER — MOXIFLOXACIN HCL 0.5 % OP SOLN
OPHTHALMIC | Status: DC | PRN
  Administered 2022-08-19: .2 mL via OPHTHALMIC

## 2022-08-19 SURGICAL SUPPLY — 10 items
CATARACT SUITE SIGHTPATH (MISCELLANEOUS) ×1 IMPLANT
FEE CATARACT SUITE SIGHTPATH (MISCELLANEOUS) ×1 IMPLANT
GLOVE SRG 8 PF TXTR STRL LF DI (GLOVE) ×1 IMPLANT
GLOVE SURG ENC TEXT LTX SZ7.5 (GLOVE) ×1 IMPLANT
GLOVE SURG UNDER POLY LF SZ8 (GLOVE) ×1
LENS CLAREON TORIC CNW0T3 21.0 ×1 IMPLANT
LENS IOL CLRN TRC 3 21.0 IMPLANT
NDL FILTER BLUNT 18X1 1/2 (NEEDLE) ×1 IMPLANT
NEEDLE FILTER BLUNT 18X1 1/2 (NEEDLE) ×1 IMPLANT
SYR 3ML LL SCALE MARK (SYRINGE) ×1 IMPLANT

## 2022-08-19 NOTE — H&P (Signed)
[facility]   Primary Care Physician:  Excell Seltzer, MD Ophthalmologist: Dr. Lockie Mola  Pre-Procedure History & Physical: HPI:  Gina Harper is a 72 y.o. female here for ophthalmic surgery.   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HCC)    rectal spasm,syncope, nausea, ongoing for years   Allergic rhinitis, cause unspecified    Allergy    Alopecia, unspecified    Anemia    pernicious anemia   Arthritis    hands, knees, hips   Asthma    Cancer (HCC)    melanoma   Chicken pox    Depressive disorder, not elsewhere classified    Disorder of bone and cartilage, unspecified    Esophageal reflux    GERD (gastroesophageal reflux disease)    Hepatitis    History of shingles    Insomnia, unspecified    Measles    red measles   Motion sickness    Mumps    Other abnormal glucose    Palpitations    Syncope, vasovagal    Tobacco use disorder    Unspecified asthma(493.90)    exercise induced. Never hospitalized   Unspecified essential hypertension    Unspecified hearing loss    Unspecified pruritic disorder    Wears hearing aid in both ears     Past Surgical History:  Procedure Laterality Date   bladder tact  1995   BROW LIFT Bilateral 07/09/2015   Procedure: BLEPHAROPLASTY;  Surgeon: Imagene Riches, MD;  Location: [facility];  Service: Ophthalmology;  Laterality: Bilateral;   BUNIONECTOMY  [id number]   bilateral   CATARACT EXTRACTION W/PHACO Left 08/05/2022   Procedure: CATARACT EXTRACTION PHACO AND INTRAOCULAR LENS PLACEMENT (IOC) LEFT  8.47  01:06.2;  Surgeon: Lockie Mola, MD;  Location: [facility];  Service: Ophthalmology;  Laterality: Left;   colonoscopy  04/20/2004   no polyps; normal.  Repeat in 10 years.  [place].   COLONOSCOPY WITH PROPOFOL N/A 05/20/2015   Procedure: COLONOSCOPY WITH PROPOFOL;  Surgeon: Christena Deem, MD;  Location: [facility];  Service: Endoscopy;  Laterality: N/A;    COLONOSCOPY WITH PROPOFOL N/A 09/25/2020   Procedure: COLONOSCOPY WITH PROPOFOL;  Surgeon: Toledo, Boykin Nearing, MD;  Location: [facility];  Service: Gastroenterology;  Laterality: N/A;   COSMETIC SURGERY     rhinoplasty   ESOPHAGOGASTRODUODENOSCOPY  03/30/2005   PTOSIS REPAIR Bilateral 07/09/2015   Procedure: PTOSIS REPAIR;  Surgeon: Imagene Riches, MD;  Location: [facility];  Service: Ophthalmology;  Laterality: Bilateral;   rhinoplasty with septoplasty     TONSILLECTOMY AND ADENOIDECTOMY     TUBAL LIGATION     VAGINAL HYSTERECTOMY  1997   menorrhagia ovaries intact    Prior to Admission medications   Medication Sig Start Date End Date Taking? Authorizing Provider  atorvastatin (LIPITOR) 10 MG tablet TAKE 1 TABLET BY MOUTH DAILY 06/17/22  Yes Bedsole, Amy E, MD  Biotin 45409 MCG TABS Take 1 tablet by mouth daily.   Yes [provider]  cetirizine (ZYRTEC) 10 MG tablet Take 10 mg by mouth as needed.    Yes [provider]  Cholecalciferol (VITAMIN D3) 2000 UNITS TABS Take by mouth daily.    Yes [provider]  docusate sodium (COLACE) 100 MG capsule Take 100 mg by mouth daily.   Yes [provider]  doxycycline (VIBRA-TABS) 100 MG tablet Take 1 tablet (100 mg total) by mouth 2 (two) times daily for 7 days. 08/18/22  08/25/22 Yes Cobb, Ruby Cola, NP  FLUoxetine (PROZAC) 10 MG capsule TAKE 1 CAPSULE BY MOUTH DAILY 04/30/22  Yes Bedsole, Amy E, MD  hydrochlorothiazide (HYDRODIURIL) 25 MG tablet TAKE 1 TABLET BY MOUTH DAILY 06/17/22  Yes Bedsole, Amy E, MD  Magnesium 250 MG TABS Take 250 mg by mouth daily.   Yes [provider]  Multiple Vitamin (MULTI-VITAMINS) TABS Take by mouth daily.   Yes [provider]  Potassium Gluconate 550 MG TABS Take by mouth daily.    Yes [provider]  vitamin B-12 (CYANOCOBALAMIN) 1000 MCG tablet Take 1,000 mcg by mouth daily.   Yes [provider]  albuterol (VENTOLIN HFA) 108 (90  Base) MCG/ACT inhaler Inhale 2 puffs into the lungs every 6 (six) hours as needed for wheezing or shortness of breath. 08/18/22   Cobb, Ruby Cola, NP  FLUAD QUADRIVALENT 0.5 ML injection  02/22/22   [provider]  fluticasone (FLONASE) 50 MCG/ACT nasal spray Place 1 spray into both nostrils daily. 08/18/22   Cobb, Ruby Cola, NP  predniSONE (DELTASONE) 20 MG tablet Take 1 tablet (20 mg total) by mouth daily with breakfast for 5 days. 08/18/22 08/23/22  Cobb, Ruby Cola, NP  promethazine (PHENERGAN) 25 MG suppository Place 1 suppository (25 mg total) rectally every 6 (six) hours as needed. 03/26/21   Eden Emms, NP    Allergies as of 07/22/2022 - Review Complete 05/26/2022  Allergen Reaction Noted   Cefdinir Hives 07/04/2014   Lisinopril Cough and Other (See Comments) 01/31/2014   Meloxicam Swelling 08/15/2012   Omnipen [ampicillin] Other (See Comments) 08/15/2012   Augmentin [amoxicillin-pot clavulanate] Rash 02/08/2012   Clarithromycin Rash 07/04/2014    Family History  Problem Relation Age of Onset   Cancer Mother        Lung, Bladder   Stroke Father 88       TIAs   Heart disease Father 67       AMI x 2.   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  Tobacco Use   Smoking status: Former    Packs/day: 2.00    Years: 17.00    Additional pack years: 0.00    Total pack years: 34.00    Types: Cigarettes    Quit date: 05/22/1988    Years since quitting: 34.2   Smokeless tobacco: Never   Tobacco comments:    Quit 22 years ago, 1990  Vaping Use   Vaping Use: Never used  Substance and Sexual  Activity   Alcohol use: Yes    Alcohol/week: 6.0 standard drinks of alcohol    Types: 6 Standard drinks or equivalent per week    Comment: moderate white wine 5 glasses per week   Drug use: No   Sexual activity: Yes    Partners: Male    Birth control/protection: Post-menopausal, Surgical  Other Topics Concern   Not on file  Social History Narrative      Marital status:  Married x 40 years,  happily married; no domestic abuse.      Children:  1 child; 3 grandchildren local.      Employment:  Retired in 09/2012; [employer] x 21 years; happy.  Pre-admission testing.      Tobacco:  Previous smoker. Quit 1990      Alcohol:  2 glasses of wine three nights per week.      Drugs; none      Exercise:  Treadmill, elliptical, Zumba.  [employer] 3x per week.      Seatbelt:  Always uses seat belts.       +Smoke alarm and carbon monoxide detector in the home.       Guns:  Guns stored in locked cabinet.       Caffeine use: Coffee, Tea, Carbonated beverages, 3 servings / day.                 Social Determinants of Health   Financial Resource Strain: Low Risk  (04/24/2022)   Overall Financial Resource Strain (CARDIA)    Difficulty of Paying Living Expenses: Not hard at all  Food Insecurity: No Food Insecurity (04/24/2022)   Hunger Vital Sign    Worried About Running Out of Food in the Last Year: Never true    Ran Out of Food in the Last Year: Never true  Transportation Needs: No Transportation Needs (04/24/2022)   PRAPARE - Administrator, Civil Service (Medical): No    Lack of Transportation (Non-Medical): No  Physical Activity: Sufficiently Active (04/24/2022)   Exercise Vital Sign    Days of Exercise per Week: 5 days    Minutes of Exercise per Session: 30 min  Stress: No Stress Concern Present (04/24/2022)   [employer] of Occupational Health - Occupational Stress Questionnaire    Feeling of Stress : Not at all  Social Connections: Moderately Integrated (04/24/2022)   Social Connection and  Isolation Panel [NHANES]    Frequency of Communication with Friends and Family: More than three times a week    Frequency of Social Gatherings with Friends and Family: More than three times a week    Attends Religious Services: Never    Database administrator or Organizations: Yes    Attends Engineer, structural: More than 4 times per year    Marital Status: Married  Catering manager Violence: Not At Risk (04/24/2022)   Humiliation, Afraid, Rape, and Kick questionnaire    Fear of Current or Ex-Partner: No    Emotionally Abused: No    Physically Abused: No    Sexually Abused: No    Review of Systems: See HPI, otherwise negative ROS  Physical Exam: BP 134/77   Temp (!) 97.2 F (36.2 C) (Tympanic)   Resp 11   Ht 5' 2.01" (1.575 m)   Wt 75.1 kg   SpO2 95%   BMI 30.28 kg/m  General:   Alert,  pleasant and cooperative in NAD Head:  Normocephalic and atraumatic. Lungs:  Clear to auscultation.    Heart:  Regular rate and rhythm.   Impression/Plan: Gina Harper is here for ophthalmic surgery.  Risks, benefits, limitations, and alternatives regarding ophthalmic surgery have been reviewed with the patient.  Questions have been answered.  All parties agreeable.   Lockie Mola, MD  08/19/2022, 8:31 AM

## 2022-08-19 NOTE — Op Note (Signed)
LOCATION:  [facility]   PREOPERATIVE DIAGNOSIS:  Nuclear sclerotic cataract of the right eye.  H25.11   POSTOPERATIVE DIAGNOSIS:  Nuclear sclerotic cataract of the right eye.   PROCEDURE:  Phacoemulsification with Toric posterior chamber intraocular lens placement of the right eye.  Ultrasound time: Procedure(s): CATARACT EXTRACTION PHACO AND INTRAOCULAR LENS PLACEMENT (IOC) RIGHT CLAREON  TORIC  4.43  00:33.2 (Right)  LENS:   Implant Name Type Inv. Item Serial No. Manufacturer Lot No. LRB No. Used Action  LENS CLAREON TORIC [id number] 21.0 - [id number]  LENS CLAREON TORIC [id number] 21.0 [id number] [employer]  Right 1 Implanted     Toric intraocular lens with 1.5 diopters of cylindrical power with axis orientation at 114 degrees.   SURGEON:  Deirdre Evener, MD   ANESTHESIA: Topical with tetracaine drops and 2% Xylocaine jelly, augmented with 1% preservative-free intracameral lidocaine. .   COMPLICATIONS:  None.   DESCRIPTION OF PROCEDURE:  The patient was identified in the holding room and transported to the operating suite and placed in the supine position under the operating microscope.  The right eye was identified as the operative eye, and it was prepped and draped in the usual sterile ophthalmic fashion.    A clear-corneal paracentesis incision was made at the 12:00 position.  0.5 ml of preservative-free 1% lidocaine was injected into the anterior chamber. The anterior chamber was filled with Viscoat.  A 2.4 millimeter near clear corneal incision was then made at the 9:00 position.  A cystotome and capsulorrhexis forceps were then used to make a curvilinear capsulorrhexis.  Hydrodissection and hydrodelineation were then performed using balanced salt solution.   Phacoemulsification was then used in stop and chop fashion to remove the lens, nucleus and epinucleus.  The remaining cortex was aspirated using the irrigation and aspiration handpiece.  Provisc viscoelastic was  then placed into the capsular bag to distend it for lens placement.  The Verion digital marker was used to align the implant at the intended axis.   A Toric lens was then injected into the capsular bag.  It was rotated clockwise until the axis marks on the lens were approximately 15 degrees in the counterclockwise direction to the intended alignment.  The viscoelastic was aspirated from the eye using the irrigation aspiration handpiece.  Then, a Koch spatula through the sideport incision was used to rotate the lens in a clockwise direction until the axis markings of the intraocular lens were lined up with the Verion alignment.  Balanced salt solution was then used to hydrate the wounds. Vigamox 0.2 ml of a 1mg  per ml solution was injected into the anterior chamber for a dose of 0.2 mg of intracameral antibiotic at the completion of the case.    The eye was noted to have a physiologic pressure and there was no wound leak noted.   Timolol and Brimonidine drops were applied to the eye.  The patient was taken to the recovery room in stable condition having had no complications of anesthesia or surgery.  [name] 08/19/2022, 9:07 AM

## 2022-08-19 NOTE — Anesthesia Postprocedure Evaluation (Signed)
Anesthesia Post Note  Patient: Gina Harper  Procedure(s) Performed: CATARACT EXTRACTION PHACO AND INTRAOCULAR LENS PLACEMENT (IOC) RIGHT CLAREON  TORIC  4.43  00:33.2 (Right: Eye)  Patient location during evaluation: PACU Anesthesia Type: MAC Level of consciousness: awake and alert Pain management: pain level controlled Vital Signs Assessment: post-procedure vital signs reviewed and stable Respiratory status: spontaneous breathing, nonlabored ventilation, respiratory function stable and patient connected to nasal cannula oxygen Cardiovascular status: stable and blood pressure returned to baseline Postop Assessment: no apparent nausea or vomiting Anesthetic complications: no   No notable events documented.   Last Vitals:  Vitals:   08/19/22 0908 08/19/22 0914  BP: 128/76 137/74  Pulse: (!) 53 62  Resp: 18 12  Temp: (!) 36.3 C (!) 36.4 C  SpO2: 94% 95%    Last Pain:  Vitals:   08/19/22 0914  TempSrc:   PainSc: 0-No pain                 Katura Eatherly C Berry Godsey

## 2022-08-19 NOTE — Transfer of Care (Signed)
Immediate Anesthesia Transfer of Care Note  Patient: Gina Harper  Procedure(s) Performed: CATARACT EXTRACTION PHACO AND INTRAOCULAR LENS PLACEMENT (IOC) RIGHT CLAREON  TORIC  4.43  00:33.2 (Right: Eye)  Patient Location: PACU  Anesthesia Type: MAC  Level of Consciousness: awake, alert  and patient cooperative  Airway and Oxygen Therapy: Patient Spontanous Breathing and Patient connected to supplemental oxygen  Post-op Assessment: Post-op Vital signs reviewed, Patient's Cardiovascular Status Stable, Respiratory Function Stable, Patent Airway and No signs of Nausea or vomiting  Post-op Vital Signs: Reviewed and stable  Complications: No notable events documented.

## 2022-08-19 NOTE — Anesthesia Preprocedure Evaluation (Signed)
Anesthesia Evaluation  Patient identified by MRN, date of birth, ID band Patient awake    Reviewed: Allergy & Precautions, H&P , NPO status , Patient's Chart, lab work & pertinent test results  Airway Mallampati: III  TM Distance: >3 FB Neck ROM: Full    Dental no notable dental hx.    Pulmonary neg pulmonary ROS, asthma , sleep apnea , former smoker   Pulmonary exam normal breath sounds clear to auscultation       Cardiovascular hypertension, negative cardio ROS Normal cardiovascular exam Rhythm:Regular Rate:Normal     Neuro/Psych Seizures -,  PSYCHIATRIC DISORDERS  Depression     Neuromuscular disease negative neurological ROS  negative psych ROS   GI/Hepatic negative GI ROS, Neg liver ROS,GERD  ,,(+) Hepatitis -  Endo/Other  negative endocrine ROS    Renal/GU negative Renal ROS  negative genitourinary   Musculoskeletal negative musculoskeletal ROS (+) Arthritis ,    Abdominal   Peds negative pediatric ROS (+)  Hematology negative hematology ROS (+) Blood dyscrasia, anemia   Anesthesia Other Findings Unspecified pruritic disorder  Other abnormal glucose Insomnia, unspecified Esophageal reflux Alopecia, unspecified  Depressive disorder, not elsewhere classified Disorder of bone and cartilage, unspecified Tobacco use disorder Unspecified essential hypertension Palpitations Unspecified hearing loss Allergic rhinitis, cause unspecified Chicken pox Mumps Measles Syncope, vasovagal History of shingles  GERD (gastroesophageal reflux disease) Hepatitis  Allergy 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HCC)  Anemia Unspecified asthma(493.90) Arthritis Motion sickness  Cancer (HCC) Asthma  Wears hearing aid in both ears    Reproductive/Obstetrics negative OB ROS                              Anesthesia Physical Anesthesia Plan  ASA: 2  Anesthesia Plan: MAC   Post-op Pain Management:     Induction: Intravenous  PONV Risk Score and Plan:   Airway Management Planned: Natural Airway and Nasal Cannula  Additional Equipment:   Intra-op Plan:   Post-operative Plan:   Informed Consent: I have reviewed the patients History and Physical, chart, labs and discussed the procedure including the risks, benefits and alternatives for the proposed anesthesia with the patient or authorized representative who has indicated his/her understanding and acceptance.     Dental Advisory Given  Plan Discussed with: Anesthesiologist, CRNA and Surgeon  Anesthesia Plan Comments: (Patient consented for risks of anesthesia including but not limited to:  - adverse reactions to medications - damage to eyes, teeth, lips or other oral mucosa - nerve damage due to positioning  - sore throat or hoarseness - Damage to heart, brain, nerves, lungs, other parts of body or loss of life  Patient voiced understanding.)         Anesthesia Quick Evaluation

## 2022-08-20 NOTE — Progress Notes (Signed)
Findings consistent with bronchitis. She was treated with steroids and doxycycline. Needs f/u if no improvement. Thanks!

## 2022-08-23 LAB — ALLERGEN PANEL (27) + IGE
Alternaria Alternata IgE: <0.1 kU/L
Aspergillus Fumigatus IgE: <0.1 kU/L
Bahia Grass IgE: <0.1 kU/L
Bermuda Grass IgE: <0.1 kU/L
Cat Dander IgE: <0.1 kU/L
Cedar, Mountain IgE: <0.1 kU/L
Cladosporium Herbarum IgE: <0.1 kU/L
Cocklebur IgE: <0.1 kU/L
Cockroach, American IgE: <0.1 kU/L
Common Silver Birch IgE: <0.1 kU/L
D Farinae IgE: <0.1 kU/L
D Pteronyssinus IgE: <0.1 kU/L
Dog Dander IgE: <0.1 kU/L
Elm, American IgE: <0.1 kU/L
Hickory, White IgE: <0.1 kU/L
IgE (Immunoglobulin E), Serum: 12 IU/mL (ref 6–495)
Johnson Grass IgE: <0.1 kU/L
Kentucky Bluegrass IgE: <0.1 kU/L
Maple/Box Elder IgE: <0.1 kU/L
Mucor Racemosus IgE: <0.1 kU/L
Oak, White IgE: <0.1 kU/L
Penicillium Chrysogen IgE: <0.1 kU/L
Pigweed, Rough IgE: <0.1 kU/L
Plantain, English IgE: <0.1 kU/L
Ragweed, Short IgE: <0.1 kU/L
Setomelanomma Rostrat: <0.1 kU/L
Timothy Grass IgE: <0.1 kU/L
White Mulberry IgE: <0.1 kU/L

## 2022-08-24 NOTE — Progress Notes (Signed)
Allergen panel negative and CBC nl. Attend PFTs as previously ordered and f/u with Dr. Wynona Neat afterwards. Thanks.

## 2022-09-08 ENCOUNTER — Other Ambulatory Visit
Admission: RE | Admit: 2022-09-08 | Discharge: 2022-09-08 | Disposition: A | Discharge status: Home / Self Care | Payer: Medicare Other | Attending: Ophthalmology | Admitting: Ophthalmology

## 2022-09-08 DIAGNOSIS — R519 Headache, unspecified: Secondary | ICD-10-CM | POA: Insufficient documentation

## 2022-09-08 DIAGNOSIS — Z961 Presence of intraocular lens: Secondary | ICD-10-CM | POA: Diagnosis not present

## 2022-09-08 LAB — CBC
HCT: 40.7 % (ref 36.0–46.0)
Hemoglobin: 13.7 g/dL (ref 12.0–15.0)
MCH: 31.8 pg (ref 26.0–34.0)
MCHC: 33.7 g/dL (ref 30.0–36.0)
MCV: 94.4 fL (ref 80.0–100.0)
Platelets: 217 10*3/uL (ref 150–400)
RBC: 4.31 MIL/uL (ref 3.87–5.11)
RDW: 12.4 % (ref 11.5–15.5)
WBC: 7.5 10*3/uL (ref 4.0–10.5)
nRBC: 0 % (ref 0.0–0.2)

## 2022-09-08 LAB — SEDIMENTATION RATE: Sed Rate: 64 mm/hr — ABNORMAL HIGH (ref 0–30)

## 2022-09-08 LAB — C-REACTIVE PROTEIN: CRP: 0.6 mg/dL (ref <1.0)

## 2022-09-18 ENCOUNTER — Other Ambulatory Visit
Admission: RE | Admit: 2022-09-18 | Discharge: 2022-09-18 | Disposition: A | Discharge status: Home / Self Care | Payer: Medicare Other | Source: Ambulatory Visit | Attending: Ophthalmology | Admitting: Ophthalmology

## 2022-09-18 DIAGNOSIS — R519 Headache, unspecified: Secondary | ICD-10-CM | POA: Insufficient documentation

## 2022-09-18 LAB — SEDIMENTATION RATE: Sed Rate: 21 mm/hr (ref 0–30)

## 2022-10-10 ENCOUNTER — Other Ambulatory Visit: Payer: Self-pay | Admitting: Family Medicine

## 2022-10-12 ENCOUNTER — Ambulatory Visit (INDEPENDENT_AMBULATORY_CARE_PROVIDER_SITE_OTHER): Payer: Medicare Other | Admitting: Pulmonary Disease

## 2022-10-12 DIAGNOSIS — J4531 Mild persistent asthma with (acute) exacerbation: Secondary | ICD-10-CM | POA: Diagnosis not present

## 2022-10-12 LAB — PULMONARY FUNCTION TEST
DL/VA % pred: 112 %
DL/VA: 4.7 ml/min/mmHg/L
DLCO cor % pred: 106 %
DLCO cor: 19.21 ml/min/mmHg
DLCO unc % pred: 107 %
DLCO unc: 19.38 ml/min/mmHg
FEF 25-75 Post: 3.21 L/sec
FEF 25-75 Pre: 2.63 L/sec
FEF2575-%Change-Post: 22 %
FEF2575-%Pred-Post: 189 %
FEF2575-%Pred-Pre: 155 %
FEV1-%Change-Post: 5 %
FEV1-%Pred-Post: 106 %
FEV1-%Pred-Pre: 100 %
FEV1-Post: 2.14 L
FEV1-Pre: 2.03 L
FEV1FVC-%Change-Post: 3 %
FEV1FVC-%Pred-Pre: 112 %
FEV6-%Change-Post: 1 %
FEV6-%Pred-Post: 94 %
FEV6-%Pred-Pre: 93 %
FEV6-Post: 2.42 L
FEV6-Pre: 2.38 L
FEV6FVC-%Pred-Post: 105 %
FEV6FVC-%Pred-Pre: 105 %
FVC-%Change-Post: 1 %
FVC-%Pred-Post: 90 %
FVC-%Pred-Pre: 88 %
FVC-Post: 2.42 L
Post FEV1/FVC ratio: 88 %
Post FEV6/FVC ratio: 100 %
Pre FEV1/FVC ratio: 85 %
Pre FEV6/FVC Ratio: 100 %
RV % pred: 68 %
RV: 1.45 L
TLC % pred: 88 %
TLC: 4.23 L

## 2022-10-12 NOTE — Progress Notes (Signed)
Full PFT performed today. °

## 2022-10-12 NOTE — Patient Instructions (Signed)
Full PFT performed today. °

## 2022-10-16 ENCOUNTER — Ambulatory Visit (INDEPENDENT_AMBULATORY_CARE_PROVIDER_SITE_OTHER): Payer: Medicare Other | Admitting: Family Medicine

## 2022-10-16 VITALS — BP 130/80 | HR 72 | Temp 97.7°F | Ht 62.0 in | Wt 170.0 lb

## 2022-10-16 DIAGNOSIS — R3915 Urgency of urination: Secondary | ICD-10-CM | POA: Diagnosis not present

## 2022-10-16 LAB — POC URINALSYSI DIPSTICK (AUTOMATED)
Bilirubin, UA: NEGATIVE
Blood, UA: NEGATIVE
Glucose, UA: NEGATIVE
Ketones, UA: NEGATIVE
Nitrite, UA: NEGATIVE
Protein, UA: NEGATIVE
Spec Grav, UA: 1.015 (ref 1.010–1.025)
Urobilinogen, UA: 0.2 E.U./dL
pH, UA: 6 (ref 5.0–8.0)

## 2022-10-16 MED ORDER — SULFAMETHOXAZOLE-TRIMETHOPRIM 800-160 MG PO TABS: 1.0000 | ORAL_TABLET | Freq: Two times a day (BID) | ORAL | 0 refills | Status: DC

## 2022-10-16 NOTE — Progress Notes (Signed)
Patient ID: Gina Harper, female    DOB: 1950/10/16, 72 y.o.   MRN: [id number]  This visit was conducted in person.  BP 130/80 (BP Location: Right Arm, Patient Position: Sitting, Cuff Size: Large)   Pulse 72   Temp 97.7 F (36.5 C) (Temporal)   Ht 5\' 2"  (1.575 m)   Wt 170 lb (77.1 kg)   SpO2 98%   BMI 31.09 kg/m    CC:  Chief Complaint  Patient presents with   Urinary Urgency    Subjective:   HPI: Gina Harper is a 72 y.o. female presenting on 10/16/2022 for Urinary Urgency   Urinary Frequency  This is a new problem. The current episode started in the past 7 days (5 days). The problem has been waxing and waning. The quality of the pain is described as aching. The pain is mild. There has been no fever. Associated symptoms include frequency, hesitancy, sweats and urgency. Pertinent negatives include no chills, discharge, flank pain, hematuria, nausea, possible pregnancy or vomiting. Associated symptoms comments: Low abdominal pressure. She has tried increased fluids for the symptoms. The treatment provided no relief. There is no history of catheterization, kidney stones, recurrent UTIs, a single kidney, urinary stasis or a urological procedure.     Last UTI 07/2021 Staph, reviewed labs      Relevant past medical, surgical, family and social history reviewed and updated as indicated. Interim medical history since our last visit reviewed. Allergies and medications reviewed and updated. Outpatient Medications Prior to Visit  Medication Sig Dispense Refill   albuterol (VENTOLIN HFA) 108 (90 Base) MCG/ACT inhaler Inhale 2 puffs into the lungs every 6 (six) hours as needed for wheezing or shortness of breath. 8 g 2   atorvastatin (LIPITOR) 10 MG tablet TAKE 1 TABLET BY MOUTH DAILY 90 tablet 3   Biotin 40981 MCG TABS Take 1 tablet by mouth daily.     cetirizine (ZYRTEC) 10 MG tablet Take 10 mg by mouth as needed.      Cholecalciferol (VITAMIN D3) 2000 UNITS TABS Take by mouth  daily.      docusate sodium (COLACE) 100 MG capsule Take 100 mg by mouth daily.     FLUAD QUADRIVALENT 0.5 ML injection      fluticasone (FLONASE) 50 MCG/ACT nasal spray Place 1 spray into both nostrils daily. 18.2 mL 2   hydrochlorothiazide (HYDRODIURIL) 25 MG tablet TAKE 1 TABLET BY MOUTH DAILY 90 tablet 3   Magnesium 250 MG TABS Take 250 mg by mouth daily.     Multiple Vitamin (MULTI-VITAMINS) TABS Take by mouth daily.     Potassium Gluconate 550 MG TABS Take by mouth daily.      promethazine (PHENERGAN) 25 MG suppository Place 1 suppository (25 mg total) rectally every 6 (six) hours as needed. 12 each 1   vitamin B-12 (CYANOCOBALAMIN) 1000 MCG tablet Take 1,000 mcg by mouth daily.     FLUoxetine (PROZAC) 10 MG capsule TAKE 1 CAPSULE BY MOUTH DAILY 100 capsule 1   No facility-administered medications prior to visit.     Per HPI unless specifically indicated in ROS section below Review of Systems  Constitutional:  Negative for chills.  Gastrointestinal:  Negative for nausea and vomiting.  Genitourinary:  Positive for frequency, hesitancy and urgency. Negative for flank pain and hematuria.   Objective:  BP 130/80 (BP Location: Right Arm, Patient Position: Sitting, Cuff Size: Large)   Pulse 72   Temp 97.7 F (36.5 C) (Temporal)  Ht 5\' 2"  (1.575 m)   Wt 170 lb (77.1 kg)   SpO2 98%   BMI 31.09 kg/m   Wt Readings from Last 3 Encounters:  10/16/22 170 lb (77.1 kg)  08/19/22 165 lb 9.6 oz (75.1 kg)  08/18/22 163 lb 6.4 oz (74.1 kg)      Physical Exam Constitutional:      General: She is not in acute distress.    Appearance: Normal appearance. She is well-developed. She is not ill-appearing or toxic-appearing.  HENT:     Head: Normocephalic.     Right Ear: Hearing, tympanic membrane, ear canal and external ear normal. Tympanic membrane is not erythematous, retracted or bulging.     Left Ear: Hearing, tympanic membrane, ear canal and external ear normal. Tympanic membrane is not  erythematous, retracted or bulging.     Nose: No mucosal edema or rhinorrhea.     Right Sinus: No maxillary sinus tenderness or frontal sinus tenderness.     Left Sinus: No maxillary sinus tenderness or frontal sinus tenderness.     Mouth/Throat:     Pharynx: Uvula midline.  Eyes:     General: Lids are normal. Lids are everted, no foreign bodies appreciated.     Conjunctiva/sclera: Conjunctivae normal.     Pupils: Pupils are equal, round, and reactive to light.  Neck:     Thyroid: No thyroid mass or thyromegaly.     Vascular: No carotid bruit.     Trachea: Trachea normal.  Cardiovascular:     Rate and Rhythm: Normal rate and regular rhythm.     Pulses: Normal pulses.     Heart sounds: Normal heart sounds, S1 normal and S2 normal. No murmur heard.    No friction rub. No gallop.  Pulmonary:     Effort: Pulmonary effort is normal. No tachypnea or respiratory distress.     Breath sounds: Normal breath sounds. No decreased breath sounds, wheezing, rhonchi or rales.  Abdominal:     General: Bowel sounds are normal.     Palpations: Abdomen is soft.     Tenderness: There is abdominal tenderness in the suprapubic area. There is no right CVA tenderness or left CVA tenderness.  Musculoskeletal:     Cervical back: Normal range of motion and neck supple.  Skin:    General: Skin is warm and dry.     Findings: No rash.  Neurological:     Mental Status: She is alert.  Psychiatric:        Mood and Affect: Mood is not anxious or depressed.        Speech: Speech normal.        Behavior: Behavior normal. Behavior is cooperative.        Thought Content: Thought content normal.        Judgment: Judgment normal.       Results for orders placed or performed in visit on 10/16/22  POCT Urinalysis Dipstick (Automated)  Result Value Ref Range   Color, UA Yellow    Clarity, UA Clear    Glucose, UA Negative Negative   Bilirubin, UA Negative    Ketones, UA Negative    Spec Grav, UA 1.015 1.010 -  1.025   Blood, UA Negative    pH, UA 6.0 5.0 - 8.0   Protein, UA Negative Negative   Urobilinogen, UA 0.2 0.2 or 1.0 E.U./dL   Nitrite, UA Negative    Leukocytes, UA Moderate (2+) (A) Negative    Assessment and Plan  Urinary  urgency Assessment & Plan: Acute, positive urinalysis with leukocyte Estrace.  Symptoms fairly take a full of UTI.  Send urine for culture but treat empirically with Bactrim 1 tablet twice daily x 3 days.  Push fluids.  Return and ER precautions provided    Orders: -     POCT Urinalysis Dipstick (Automated) -     Urine Culture  Other orders -     Sulfamethoxazole-Trimethoprim; Take 1 tablet by mouth 2 (two) times daily.  Dispense: 6 tablet; Refill: 0    No follow-ups on file.   Kerby Nora, MD

## 2022-10-16 NOTE — Assessment & Plan Note (Addendum)
Acute, positive urinalysis with leukocyte Estrace.  Symptoms fairly take a full of UTI.  Send urine for culture but treat empirically with Bactrim 1 tablet twice daily x 3 days.  Push fluids.  Return and ER precautions provided

## 2022-10-17 LAB — URINE CULTURE
MICRO NUMBER:: 15140739
Result:: NO GROWTH
SPECIMEN QUALITY:: ADEQUATE

## 2022-10-20 DIAGNOSIS — G4733 Obstructive sleep apnea (adult) (pediatric): Secondary | ICD-10-CM | POA: Diagnosis not present

## 2022-10-20 NOTE — Progress Notes (Signed)
Normal pulmonary function testing. Can discuss further at her f/u with Dr. Wynona Neat. Thanks.

## 2022-10-24 ENCOUNTER — Telehealth: Payer: Medicare Other | Admitting: Nurse Practitioner

## 2022-10-24 DIAGNOSIS — H1033 Unspecified acute conjunctivitis, bilateral: Secondary | ICD-10-CM

## 2022-10-24 MED ORDER — POLYMYXIN B-TRIMETHOPRIM 10000-0.1 UNIT/ML-% OP SOLN
2.0000 [drp] | OPHTHALMIC | 0 refills | Status: DC
Start: 2022-10-24 — End: 2022-11-30

## 2022-10-24 NOTE — Patient Instructions (Signed)
Griffith Citron Gurka, thank you for joining Bennie Pierini, FNP for today's virtual visit.  While this provider is not your primary care provider (PCP), if your PCP is located in our provider database this encounter information will be shared with them immediately following your visit.   A [facility] MyChart account gives you access to today's visit and all your visits, tests, and labs performed at [facility] " click here if you don't have a [facility] MyChart account or go to [email or web address]  Consent: (Patient) Gina Harper provided verbal consent for this virtual visit at the beginning of the encounter.  Current Medications:  Current Outpatient Medications:    trimethoprim-polymyxin b (POLYTRIM) ophthalmic solution, Place 2 drops into both eyes every 4 (four) hours., Disp: 10 mL, Rfl: 0   albuterol (VENTOLIN HFA) 108 (90 Base) MCG/ACT inhaler, Inhale 2 puffs into the lungs every 6 (six) hours as needed for wheezing or shortness of breath., Disp: 8 g, Rfl: 2   atorvastatin (LIPITOR) 10 MG tablet, TAKE 1 TABLET BY MOUTH DAILY, Disp: 90 tablet, Rfl: 3   Biotin 16109 MCG TABS, Take 1 tablet by mouth daily., Disp: , Rfl:    cetirizine (ZYRTEC) 10 MG tablet, Take 10 mg by mouth as needed. , Disp: , Rfl:    Cholecalciferol (VITAMIN D3) 2000 UNITS TABS, Take by mouth daily. , Disp: , Rfl:    docusate sodium (COLACE) 100 MG capsule, Take 100 mg by mouth daily., Disp: , Rfl:    FLUAD QUADRIVALENT 0.5 ML injection, , Disp: , Rfl:    fluticasone (FLONASE) 50 MCG/ACT nasal spray, Place 1 spray into both nostrils daily., Disp: 18.2 mL, Rfl: 2   hydrochlorothiazide (HYDRODIURIL) 25 MG tablet, TAKE 1 TABLET BY MOUTH DAILY, Disp: 90 tablet, Rfl: 3   Magnesium 250 MG TABS, Take 250 mg by mouth daily., Disp: , Rfl:    Multiple Vitamin (MULTI-VITAMINS) TABS, Take by mouth daily., Disp: , Rfl:    Potassium Gluconate 550 MG TABS, Take by mouth daily. , Disp: , Rfl:    promethazine  (PHENERGAN) 25 MG suppository, Place 1 suppository (25 mg total) rectally every 6 (six) hours as needed., Disp: 12 each, Rfl: 1   sulfamethoxazole-trimethoprim (BACTRIM DS) 800-160 MG tablet, Take 1 tablet by mouth 2 (two) times daily., Disp: 6 tablet, Rfl: 0   vitamin B-12 (CYANOCOBALAMIN) 1000 MCG tablet, Take 1,000 mcg by mouth daily., Disp: , Rfl:    Medications ordered in this encounter:  Meds ordered this encounter  Medications   trimethoprim-polymyxin b (POLYTRIM) ophthalmic solution    Sig: Place 2 drops into both eyes every 4 (four) hours.    Dispense:  10 mL    Refill:  0    Order Specific Question:   Supervising Provider    Answer:   Merrilee Jansky [[id number]]     *If you need refills on other medications prior to your next appointment, please contact your pharmacy*  Follow-Up: Call back or seek an in-person evaluation if the symptoms worsen or if the condition fails to improve as anticipated.  [facility] [phone]  Other Instructions Good handwashing Cool compresses   If you have been instructed to have an in-person evaluation today at a local Urgent Care facility, please use the link below. It will take you to a list of all of our available [facility], including address, phone number and hours of operation. Please do not delay care.  [facility]  If you or a family member do not have a primary care provider, use the link below to schedule a visit and establish care. When you choose a [facility] primary care physician or advanced practice provider, you gain a long-term partner in health. Find a Primary Care Provider  Learn more about [facility]'s in-office and virtual care options: [facility] - Get Care Now

## 2022-10-24 NOTE — Progress Notes (Signed)
Virtual Visit Consent   Gina Harper, you are scheduled for a virtual visit with Gina Daphine Deutscher, FNP, a [facility] provider, today.     Just as with appointments in the office, your consent must be obtained to participate.  Your consent will be active for this visit and any virtual visit you may have with one of our providers in the next 365 days.     If you have a MyChart account, a copy of this consent can be sent to you electronically.  All virtual visits are billed to your insurance company just like a traditional visit in the office.    As this is a virtual visit, video technology does not allow for your provider to perform a traditional examination.  This may limit your provider's ability to fully assess your condition.  If your provider identifies any concerns that need to be evaluated in person or the need to arrange testing (such as labs, EKG, etc.), we will make arrangements to do so.     Although advances in technology are sophisticated, we cannot ensure that it will always work on either your end or our end.  If the connection with a video visit is poor, the visit may have to be switched to a telephone visit.  With either a video or telephone visit, we are not always able to ensure that we have a secure connection.     I need to obtain your verbal consent now.   Are you willing to proceed with your visit today? YES   Gina Harper has provided verbal consent on 10/24/2022 for a virtual visit (video or telephone).   Gina Daphine Deutscher, FNP   Date: 10/24/2022 8:37 AM   Virtual Visit via Video Note   I, Gina Harper, connected with Gina Harper ([id number], July 25, 1950) on 10/24/22 at  8:45 AM EDT by a video-enabled telemedicine application and verified that I am speaking with the correct person using two identifiers.  Location: Patient: Virtual Visit Location Patient: Home Provider: Virtual Visit Location Provider: Mobile   I discussed the limitations of  evaluation and management by telemedicine and the availability of in person appointments. The patient expressed understanding and agreed to proceed.    History of Present Illness: Gina Harper is a 72 y.o. who identifies as a female who was assigned female at birth, and is being seen today for red eye.  HPI: Conjunctivitis  The current episode started yesterday. The onset was sudden. The problem occurs continuously. The problem has been rapidly worsening. The problem is mild. Nothing relieves the symptoms. Associated symptoms include eye itching, eye discharge, eye pain and eye redness. Pertinent negatives include no fever and no congestion. The eye pain is mild. Both eyes are affected. The eye pain is not associated with movement. The eyelid exhibits redness.    Review of Systems  Constitutional:  Negative for fever.  HENT:  Negative for congestion.   Eyes:  Positive for pain, discharge, redness and itching.    Problems:  Patient Active Problem List   Diagnosis Date Noted   Urinary urgency 10/16/2022   Severe obstructive sleep apnea 08/18/2022   Mild persistent asthmatic bronchitis with exacerbation 08/18/2022   Acute sinusitis 08/18/2022   Excessive daytime sleepiness 05/26/2022   Obesity (BMI 30.0-34.9) 05/26/2022   Thyroid nodule 04/28/2022   Snoring 04/28/2022   Breast pain, left 04/28/2022   Degeneration of lumbar intervertebral disc 11/20/2021   Myofascial pain 11/20/2021   GERD (gastroesophageal reflux disease)  04/05/2020   Syncope 04/05/2020   Memory loss 04/05/2020   Abnormal findings on diagnostic imaging of lung 04/05/2020   Lung nodule 03/10/2019   Aortic atherosclerosis (HCC) 03/10/2019   Chronic insomnia 08/06/2016   Osteopenia 01/14/2016   Venous insufficiency, peripheral 11/18/2015   B12 deficiency 11/18/2015   Seasonal allergies 11/18/2015   Chronic dermatitis of hands 01/31/2014   Peripheral neuropathy 01/31/2014   Prediabetes 11/08/2013   MDD (major  depressive disorder), single episode, moderate (HCC) 04/05/2012   Pure hypercholesterolemia 04/05/2012   Atrophic vaginitis 04/05/2012   Elevated CEA 04/05/2012   Exercise-induced asthma 04/05/2012   Essential hypertension, benign 04/05/2012    Allergies:  Allergies  Allergen Reactions   Cefdinir Hives   Lisinopril Cough and Other (See Comments)   Meloxicam Swelling   Omnipen [Ampicillin] Other (See Comments)   Augmentin [Amoxicillin-Pot Clavulanate] Rash   Clarithromycin Rash   Medications:  Current Outpatient Medications:    albuterol (VENTOLIN HFA) 108 (90 Base) MCG/ACT inhaler, Inhale 2 puffs into the lungs every 6 (six) hours as needed for wheezing or shortness of breath., Disp: 8 g, Rfl: 2   atorvastatin (LIPITOR) 10 MG tablet, TAKE 1 TABLET BY MOUTH DAILY, Disp: 90 tablet, Rfl: 3   Biotin 16109 MCG TABS, Take 1 tablet by mouth daily., Disp: , Rfl:    cetirizine (ZYRTEC) 10 MG tablet, Take 10 mg by mouth as needed. , Disp: , Rfl:    Cholecalciferol (VITAMIN D3) 2000 UNITS TABS, Take by mouth daily. , Disp: , Rfl:    docusate sodium (COLACE) 100 MG capsule, Take 100 mg by mouth daily., Disp: , Rfl:    FLUAD QUADRIVALENT 0.5 ML injection, , Disp: , Rfl:    fluticasone (FLONASE) 50 MCG/ACT nasal spray, Place 1 spray into both nostrils daily., Disp: 18.2 mL, Rfl: 2   hydrochlorothiazide (HYDRODIURIL) 25 MG tablet, TAKE 1 TABLET BY MOUTH DAILY, Disp: 90 tablet, Rfl: 3   Magnesium 250 MG TABS, Take 250 mg by mouth daily., Disp: , Rfl:    Multiple Vitamin (MULTI-VITAMINS) TABS, Take by mouth daily., Disp: , Rfl:    Potassium Gluconate 550 MG TABS, Take by mouth daily. , Disp: , Rfl:    promethazine (PHENERGAN) 25 MG suppository, Place 1 suppository (25 mg total) rectally every 6 (six) hours as needed., Disp: 12 each, Rfl: 1   sulfamethoxazole-trimethoprim (BACTRIM DS) 800-160 MG tablet, Take 1 tablet by mouth 2 (two) times daily., Disp: 6 tablet, Rfl: 0   vitamin B-12 (CYANOCOBALAMIN)  1000 MCG tablet, Take 1,000 mcg by mouth daily., Disp: , Rfl:   Observations/Objective: Patient is well-developed, well-nourished in no acute distress.  Resting comfortably  at home.  Head is normocephalic, atraumatic.  No labored breathing.  Speech is clear and coherent with logical content.  Patient is alert and oriented at baseline.  Bil scleral injection Yellow excudate noted bil inner canthus  Assessment and Plan:  Gina Citron Cielo in today with chief complaint of Conjunctivitis   1. Acute bacterial conjunctivitis of both eyes Avoid rubbing eyes Cool compresses Good handwashing - trimethoprim-polymyxin b (POLYTRIM) ophthalmic solution; Place 2 drops into both eyes every 4 (four) hours.  Dispense: 10 mL; Refill: 0    Follow Up Instructions: I discussed the assessment and treatment plan with the patient. The patient was provided an opportunity to ask questions and all were answered. The patient agreed with the plan and demonstrated an understanding of the instructions.  A copy of instructions were sent to the patient  via MyChart.  The patient was advised to call back or seek an in-person evaluation if the symptoms worsen or if the condition fails to improve as anticipated.  Time:  I spent 8 minutes with the patient via telehealth technology discussing the above problems/concerns.    Gina Daphine Deutscher, FNP

## 2022-10-26 ENCOUNTER — Telehealth: Payer: Self-pay

## 2022-10-26 DIAGNOSIS — Z86018 Personal history of other benign neoplasm: Secondary | ICD-10-CM | POA: Diagnosis not present

## 2022-10-26 DIAGNOSIS — Z85820 Personal history of malignant melanoma of skin: Secondary | ICD-10-CM | POA: Diagnosis not present

## 2022-10-26 DIAGNOSIS — L578 Other skin changes due to chronic exposure to nonionizing radiation: Secondary | ICD-10-CM | POA: Diagnosis not present

## 2022-10-26 DIAGNOSIS — Z872 Personal history of diseases of the skin and subcutaneous tissue: Secondary | ICD-10-CM | POA: Diagnosis not present

## 2022-10-26 DIAGNOSIS — L298 Other pruritus: Secondary | ICD-10-CM | POA: Diagnosis not present

## 2022-10-26 NOTE — Telephone Encounter (Signed)
Per chart review tab pt had VV on 10/24/22. Sending note to Dr Ermalene Searing and [place] pool.

## 2022-10-27 NOTE — Telephone Encounter (Signed)
Noted  

## 2022-11-20 DIAGNOSIS — G4733 Obstructive sleep apnea (adult) (pediatric): Secondary | ICD-10-CM | POA: Diagnosis not present

## 2022-11-24 ENCOUNTER — Other Ambulatory Visit: Payer: Self-pay | Admitting: Family Medicine

## 2022-11-24 DIAGNOSIS — Z1231 Encounter for screening mammogram for malignant neoplasm of breast: Secondary | ICD-10-CM

## 2022-11-25 DIAGNOSIS — G4733 Obstructive sleep apnea (adult) (pediatric): Secondary | ICD-10-CM | POA: Diagnosis not present

## 2022-11-30 ENCOUNTER — Ambulatory Visit: Payer: Medicare Other | Admitting: Pulmonary Disease

## 2022-11-30 VITALS — BP 116/68 | HR 63 | Ht 62.0 in | Wt 169.0 lb

## 2022-11-30 DIAGNOSIS — G4733 Obstructive sleep apnea (adult) (pediatric): Secondary | ICD-10-CM | POA: Diagnosis not present

## 2022-11-30 DIAGNOSIS — J4531 Mild persistent asthma with (acute) exacerbation: Secondary | ICD-10-CM

## 2022-11-30 MED ORDER — BUDESONIDE-FORMOTEROL FUMARATE 80-4.5 MCG/ACT IN AERO: 2.0000 | INHALATION_SPRAY | Freq: Two times a day (BID) | RESPIRATORY_TRACT | 5 refills | Status: DC | PRN

## 2022-11-30 NOTE — Patient Instructions (Signed)
I will see you about 6 months  Prescription for Symbicort sent to pharmacy for you, you can use this 2 puffs up to twice a day as needed, this will help your cough, you can use it just as needed as well  Continue using your CPAP on a nightly basis  Continue weight loss efforts  Call us with significant concerns

## 2022-11-30 NOTE — Progress Notes (Signed)
Gina Harper    [id number]    [birth year]  Primary Care Physician:Bedsole, Luberta Robertson, MD  Referring Physician: Excell Seltzer, MD [address] [place],  [place] [address]  Chief complaint:   Patient being seen for obstructive sleep apnea  HPI:  Diagnosed with severe obstructive sleep apnea Has been trying to get used to using CPAP on a nightly basis She feels she is benefiting from CPAP use  We did discussed about the pathophysiology of sleep disordered breathing and treatment options  History of asthma -Uses albuterol as needed  Reformed smoker, 34-pack-year smoking history   She does have some CPAP mask issues Sometimes feel the pressure is too much  Has no difficulty putting the mask on at night and falling asleep  Outpatient Encounter Medications as of 11/30/2022  Medication Sig   albuterol (VENTOLIN HFA) 108 (90 Base) MCG/ACT inhaler Inhale 2 puffs into the lungs every 6 (six) hours as needed for wheezing or shortness of breath.   atorvastatin (LIPITOR) 10 MG tablet TAKE 1 TABLET BY MOUTH DAILY   Biotin 42595 MCG TABS Take 1 tablet by mouth daily.   budesonide-formoterol (SYMBICORT) 80-4.5 MCG/ACT inhaler Inhale 2 puffs into the lungs every 12 (twelve) hours as needed.   cetirizine (ZYRTEC) 10 MG tablet Take 10 mg by mouth as needed.    Cholecalciferol (VITAMIN D3) 2000 UNITS TABS Take by mouth daily.    docusate sodium (COLACE) 100 MG capsule Take 100 mg by mouth daily.   fluticasone (FLONASE) 50 MCG/ACT nasal spray Place 1 spray into both nostrils daily.   hydrochlorothiazide (HYDRODIURIL) 25 MG tablet TAKE 1 TABLET BY MOUTH DAILY   Magnesium 250 MG TABS Take 250 mg by mouth daily.   Multiple Vitamin (MULTI-VITAMINS) TABS Take by mouth daily.   Potassium Gluconate 550 MG TABS Take by mouth daily.    promethazine (PHENERGAN) 25 MG suppository Place 1 suppository (25 mg total) rectally every 6 (six) hours as needed.   vitamin B-12 (CYANOCOBALAMIN)  1000 MCG tablet Take 1,000 mcg by mouth daily.   [DISCONTINUED] FLUAD QUADRIVALENT 0.5 ML injection    [DISCONTINUED] trimethoprim-polymyxin b (POLYTRIM) ophthalmic solution Place 2 drops into both eyes every 4 (four) hours.   [DISCONTINUED] sulfamethoxazole-trimethoprim (BACTRIM DS) 800-160 MG tablet Take 1 tablet by mouth 2 (two) times daily.   No facility-administered encounter medications on file as of 11/30/2022.    Allergies as of 11/30/2022 - Review Complete 10/24/2022  Allergen Reaction Noted   Cefdinir Hives 07/04/2014   Lisinopril Cough and Other (See Comments) 01/31/2014   Meloxicam Swelling 08/15/2012   Omnipen [ampicillin] Other (See Comments) 08/15/2012   Augmentin [amoxicillin-pot clavulanate] Rash 02/08/2012   Clarithromycin Rash 07/04/2014    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HCC)    rectal spasm,syncope, nausea, ongoing for years   Allergic rhinitis, cause unspecified    Allergy    Alopecia, unspecified    Anemia    pernicious anemia   Arthritis    hands, knees, hips   Asthma    Cancer (HCC)    melanoma   Chicken pox    Depressive disorder, not elsewhere classified    Disorder of bone and cartilage, unspecified    Esophageal reflux    GERD (gastroesophageal reflux disease)    Hepatitis    History of shingles    Insomnia, unspecified    Measles    red measles   Motion sickness  Mumps    Other abnormal glucose    Palpitations    Syncope, vasovagal    Tobacco use disorder    Unspecified asthma(493.90)    exercise induced. Never hospitalized   Unspecified essential hypertension    Unspecified hearing loss    Unspecified pruritic disorder    Wears hearing aid in both ears     Past Surgical History:  Procedure Laterality Date   bladder tact  1995   BROW LIFT Bilateral 07/09/2015   Procedure: BLEPHAROPLASTY;  Surgeon: Imagene Riches, MD;  Location: [facility];  Service: Ophthalmology;  Laterality: Bilateral;    BUNIONECTOMY  [id number]   bilateral   CATARACT EXTRACTION W/PHACO Left 08/05/2022   Procedure: CATARACT EXTRACTION PHACO AND INTRAOCULAR LENS PLACEMENT (IOC) LEFT  8.47  01:06.2;  Surgeon: Lockie Mola, MD;  Location: [facility];  Service: Ophthalmology;  Laterality: Left;   CATARACT EXTRACTION W/PHACO Right 08/19/2022   Procedure: CATARACT EXTRACTION PHACO AND INTRAOCULAR LENS PLACEMENT (IOC) RIGHT CL[place]ON  TORIC  4.43  00:33.2;  Surgeon: Lockie Mola, MD;  Location: [facility];  Service: Ophthalmology;  Laterality: Right;   colonoscopy  04/20/2004   no polyps; normal.  Repeat in 10 years.  [place].   COLONOSCOPY WITH PROPOFOL N/A 05/20/2015   Procedure: COLONOSCOPY WITH PROPOFOL;  Surgeon: Christena Deem, MD;  Location: [facility];  Service: Endoscopy;  Laterality: N/A;   COLONOSCOPY WITH PROPOFOL N/A 09/25/2020   Procedure: COLONOSCOPY WITH PROPOFOL;  Surgeon: Toledo, Boykin Nearing, MD;  Location: [facility] ENDOSCOPY;  Service: Gastroenterology;  Laterality: N/A;   COSMETIC SURGERY     rhinoplasty   ESOPHAGOGASTRODUODENOSCOPY  03/30/2005   PTOSIS REPAIR Bilateral 07/09/2015   Procedure: PTOSIS REPAIR;  Surgeon: Imagene Riches, MD;  Location: [facility];  Service: Ophthalmology;  Laterality: Bilateral;   rhinoplasty with septoplasty     TONSILLECTOMY AND ADENOIDECTOMY     TUBAL LIGATION     VAGINAL HYSTERECTOMY  1997   menorrhagia ovaries intact    Family History  Problem Relation Age of Onset   Cancer Mother        Lung, Bladder   Stroke Father 95       TIAs   Heart disease Father 21       AMI x 2.   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  Tobacco Use   Smoking status: Former    Current packs/day: 0.00    Average packs/day: 2.0 packs/day for 17.0 years (34.0 ttl pk-yrs)    Types: Cigarettes    Start date: 05/23/1971    Quit date: 05/22/1988    Years since quitting: 34.5   Smokeless tobacco: Never   Tobacco comments:    Quit 22 years ago, 1990  Vaping Use   Vaping status: Never Used  Substance and Sexual Activity   Alcohol use: Yes    Alcohol/week: 6.0 standard drinks of alcohol    Types: 6 Standard drinks or equivalent per week  Comment: moderate white wine 5 glasses per week   Drug use: No   Sexual activity: Yes    Partners: Male    Birth control/protection: Post-menopausal, Surgical  Other Topics Concern   Not on file  Social History Narrative      Marital status:  Married x 40 years, happily married; no domestic abuse.      Children:  1 child; 3 grandchildren local.      Employment:  Retired in 09/2012; [employer] x 21 years; happy.  Pre-admission testing.      Tobacco:  Previous smoker. Quit 1990      Alcohol:  2 glasses of wine three nights per week.      Drugs; none      Exercise:  Treadmill, elliptical, Zumba.  [employer] 3x per week.      Seatbelt:  Always uses seat belts.       +Smoke alarm and carbon monoxide detector in the home.       Guns:  Guns stored in locked cabinet.       Caffeine use: Coffee, Tea, Carbonated beverages, 3 servings / day.                 Social Determinants of Health   Financial Resource Strain: Low Risk  (10/16/2022)   Overall Financial Resource Strain (CARDIA)    Difficulty of Paying Living Expenses: Not hard at all  Food Insecurity: No Food Insecurity (10/16/2022)   Hunger Vital Sign    Worried About Running Out of Food in the Last Year: Never true    Ran Out of Food in the Last Year: Never true  Transportation Needs: No Transportation Needs (10/16/2022)   PRAPARE - Therapist, art (Medical): No    Lack of Transportation (Non-Medical): No  Physical Activity: Insufficiently Active (10/16/2022)   Exercise Vital Sign    Days of Exercise per Week: 3 days    Minutes of Exercise per Session: 30 min  Stress: No Stress Concern Present (10/16/2022)   [employer] of Occupational Health - Occupational Stress Questionnaire    Feeling of Stress : Not at all  Social Connections: Socially Integrated (10/16/2022)   Social Connection and Isolation Panel [NHANES]    Frequency of Communication with Friends and Family: More than three times a week    Frequency of Social Gatherings with Friends and Family: Twice a week    Attends Religious Services: More than 4 times per year    Active Member of [employer] or Organizations: Yes    Attends Engineer, structural: More than 4 times per year    Marital Status: Married  Catering manager Violence: Not At Risk (04/24/2022)   Humiliation, Afraid, Rape, and Kick questionnaire    Fear of Current or Ex-Partner: No    Emotionally Abused: No    Physically Abused: No    Sexually Abused: No    Review of Systems  Constitutional:  Negative for fatigue.  Respiratory:  Positive for apnea.   Psychiatric/Behavioral:  Positive for sleep disturbance.     Vitals:   11/30/22 0959  BP: 116/68  Pulse: 63  SpO2: 95%     Physical Exam Constitutional:      Appearance: Normal appearance.  HENT:     Head: Normocephalic.     Nose: Nose normal.     Mouth/Throat:     Mouth: Mucous membranes are moist.  Eyes:     General: No scleral icterus. Cardiovascular:  Rate and Rhythm: Normal rate and regular rhythm.     Heart sounds: No murmur heard.    No friction rub.  Pulmonary:     Effort: No respiratory distress.     Breath sounds: No stridor. No wheezing or rhonchi.  Musculoskeletal:     Cervical back: No rigidity or tenderness.  Neurological:     Mental Status: She is alert.  Psychiatric:        Mood and Affect:  Mood normal.      Data Reviewed: Previous sleep study result reviewed  Compliance data reviewed showing excellent compliance with CPAP Average use of 7 hours 15 minutes Machine settings 5-20 Residual AHI of 4.3  Sleep study with AHI over 50  Assessment:  Severe obstructive sleep apnea Appears well-controlled with CPAP therapy  History of asthma -On albuterol- -Discussed adding a maintenance inhaler -Will add Symbicort to be used as needed  Plan/Recommendations: Continue CPAP nightly  Continue weight loss efforts Regular exercises as tolerated  Encouraged to use Symbicort  Albuterol as needed  Call us with significant concerns  If she continues to have mask issues, encouraged to give [place] a call so that we can contact DME company for trial of a new mask   Virl Diamond MD [facility] 11/30/2022, 10:29 AM  CC: Excell Seltzer, MD

## 2022-12-14 ENCOUNTER — Ambulatory Visit
Admission: EM | Admit: 2022-12-14 | Discharge: 2022-12-14 | Disposition: A | Discharge status: Home / Self Care | Payer: Medicare Other | Attending: Emergency Medicine | Admitting: Emergency Medicine

## 2022-12-14 ENCOUNTER — Other Ambulatory Visit: Payer: Medicare Other

## 2022-12-14 ENCOUNTER — Ambulatory Visit (INDEPENDENT_AMBULATORY_CARE_PROVIDER_SITE_OTHER): Payer: Medicare Other

## 2022-12-14 DIAGNOSIS — M79675 Pain in left toe(s): Secondary | ICD-10-CM | POA: Insufficient documentation

## 2022-12-14 DIAGNOSIS — M79672 Pain in left foot: Secondary | ICD-10-CM | POA: Diagnosis not present

## 2022-12-14 NOTE — Discharge Instructions (Signed)
X-rays negative for injury to the bone, symptoms should improve with time  You may continue use of Advil and/or Tylenol taking every 6 hours for management of pain  You may continue use of ice in 10 to 15-minute intervals  You may continue activity as tolerated  you may follow-up with his urgent care or your primary doctor as needed

## 2022-12-14 NOTE — ED Provider Notes (Signed)
Gina Harper    CSN: [id number] Arrival date & time: 12/14/22  9562      History   Chief Complaint Chief Complaint  Patient presents with   Toe Injury    HPI Gina Harper is a 72 y.o. female.   Patient presents for evaluation of pain to the left great toe beginning 2 days ago after injury.  Can was dropped directly onto the toe.  Symptoms have improved per patient approximately "90%" but she wanted to ensure that it is not broken.  Able to bear weight and complete range of motion but pain elicited.  Denies numbness or tingling.  Has been using Advil and ice which has been effective.   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HCC)    rectal spasm,syncope, nausea, ongoing for years   Allergic rhinitis, cause unspecified    Allergy    Alopecia, unspecified    Anemia    pernicious anemia   Arthritis    hands, knees, hips   Asthma    Cancer (HCC)    melanoma   Chicken pox    Depressive disorder, not elsewhere classified    Disorder of bone and cartilage, unspecified    Esophageal reflux    GERD (gastroesophageal reflux disease)    Hepatitis    History of shingles    Insomnia, unspecified    Measles    red measles   Motion sickness    Mumps    Other abnormal glucose    Palpitations    Syncope, vasovagal    Tobacco use disorder    Unspecified asthma(493.90)    exercise induced. Never hospitalized   Unspecified essential hypertension    Unspecified hearing loss    Unspecified pruritic disorder    Wears hearing aid in both ears     Patient Active Problem List   Diagnosis Date Noted   Toe pain, left 12/14/2022   Urinary urgency 10/16/2022   Severe obstructive sleep apnea 08/18/2022   Mild persistent asthmatic bronchitis with exacerbation 08/18/2022   Acute sinusitis 08/18/2022   Excessive daytime sleepiness 05/26/2022   Obesity (BMI 30.0-34.9) 05/26/2022   Thyroid nodule 04/28/2022   Snoring 04/28/2022   Breast pain, left  04/28/2022   Degeneration of lumbar intervertebral disc 11/20/2021   Myofascial pain 11/20/2021   GERD (gastroesophageal reflux disease) 04/05/2020   Syncope 04/05/2020   Memory loss 04/05/2020   Abnormal findings on diagnostic imaging of lung 04/05/2020   Lung nodule 03/10/2019   Aortic atherosclerosis (HCC) 03/10/2019   Chronic insomnia 08/06/2016   Osteopenia 01/14/2016   Venous insufficiency, peripheral 11/18/2015   B12 deficiency 11/18/2015   Seasonal allergies 11/18/2015   Chronic dermatitis of hands 01/31/2014   Peripheral neuropathy 01/31/2014   Prediabetes 11/08/2013   MDD (major depressive disorder), single episode, moderate (HCC) 04/05/2012   Pure hypercholesterolemia 04/05/2012   Atrophic vaginitis 04/05/2012   Elevated CEA 04/05/2012   Exercise-induced asthma 04/05/2012   Essential hypertension, benign 04/05/2012    Past Surgical History:  Procedure Laterality Date   bladder tact  1995   BROW LIFT Bilateral 07/09/2015   Procedure: BLEPHAROPLASTY;  Surgeon: Imagene Riches, MD;  Location: [facility];  Service: Ophthalmology;  Laterality: Bilateral;   BUNIONECTOMY  [id number]   bilateral   CATARACT EXTRACTION W/PHACO Left 08/05/2022   Procedure: CATARACT EXTRACTION PHACO AND INTRAOCULAR LENS PLACEMENT (IOC) LEFT  8.47  01:06.2;  Surgeon: Lockie Mola, MD;  Location: [facility];  Service: Ophthalmology;  Laterality: Left;   CATARACT EXTRACTION W/PHACO Right 08/19/2022   Procedure: CATARACT EXTRACTION PHACO AND INTRAOCULAR LENS PLACEMENT (IOC) RIGHT CL[place]ON  TORIC  4.43  00:33.2;  Surgeon: Lockie Mola, MD;  Location: [facility];  Service: Ophthalmology;  Laterality: Right;   colonoscopy  04/20/2004   no polyps; normal.  Repeat in 10 years.  [place].   COLONOSCOPY WITH PROPOFOL N/A 05/20/2015   Procedure: COLONOSCOPY WITH PROPOFOL;  Surgeon: Christena Deem, MD;  Location: [facility];  Service: Endoscopy;  Laterality: N/A;    COLONOSCOPY WITH PROPOFOL N/A 09/25/2020   Procedure: COLONOSCOPY WITH PROPOFOL;  Surgeon: Toledo, Boykin Nearing, MD;  Location: [facility];  Service: Gastroenterology;  Laterality: N/A;   COSMETIC SURGERY     rhinoplasty   ESOPHAGOGASTRODUODENOSCOPY  03/30/2005   PTOSIS REPAIR Bilateral 07/09/2015   Procedure: PTOSIS REPAIR;  Surgeon: Imagene Riches, MD;  Location: [facility];  Service: Ophthalmology;  Laterality: Bilateral;   rhinoplasty with septoplasty     TONSILLECTOMY AND ADENOIDECTOMY     TUBAL LIGATION     VAGINAL HYSTERECTOMY  1997   menorrhagia ovaries intact    OB History   No obstetric history on file.      Home Medications    Prior to Admission medications   Medication Sig Start Date End Date Taking? Authorizing Provider  albuterol (VENTOLIN HFA) 108 (90 Base) MCG/ACT inhaler Inhale 2 puffs into the lungs every 6 (six) hours as needed for wheezing or shortness of breath. 08/18/22   Cobb, Ruby Cola, NP  atorvastatin (LIPITOR) 10 MG tablet TAKE 1 TABLET BY MOUTH DAILY 06/17/22   Bedsole, Amy E, MD  Biotin 16109 MCG TABS Take 1 tablet by mouth daily.    [provider]  budesonide-formoterol (SYMBICORT) 80-4.5 MCG/ACT inhaler Inhale 2 puffs into the lungs every 12 (twelve) hours as needed. 11/30/22   Tomma Lightning, MD  cetirizine (ZYRTEC) 10 MG tablet Take 10 mg by mouth as needed.     [provider]  Cholecalciferol (VITAMIN D3) 2000 UNITS TABS Take by mouth daily.     [provider]  docusate sodium (COLACE) 100 MG capsule Take 100 mg by mouth daily.    [provider]  fluticasone (FLONASE) 50 MCG/ACT nasal spray Place 1 spray into both nostrils daily. 08/18/22   Cobb, Ruby Cola, NP  hydrochlorothiazide (HYDRODIURIL) 25 MG tablet TAKE 1 TABLET BY MOUTH DAILY 06/17/22   Bedsole, Amy E, MD  Magnesium 250 MG TABS Take 250 mg by mouth daily.    [provider]  Multiple Vitamin (MULTI-VITAMINS) TABS Take by mouth daily.     [provider]  Potassium Gluconate 550 MG TABS Take by mouth daily.     [provider]  promethazine (PHENERGAN) 25 MG suppository Place 1 suppository (25 mg total) rectally every 6 (six) hours as needed. 03/26/21   Eden Emms, NP  vitamin B-12 (CYANOCOBALAMIN) 1000 MCG tablet Take 1,000 mcg by mouth daily.    [provider]    Family History Family History  Problem Relation Age of Onset   Cancer Mother        Lung, Bladder   Stroke Father 62       TIAs   Heart disease Father 60       AMI x 2.   Colon polyps Father    Thyroid disease Sister    Cancer Brother        lung   Breast cancer Maternal Aunt  60   Breast cancer Maternal Aunt     Social History Social History   Tobacco Use   Smoking status: Former    Current packs/day: 0.00    Average packs/day: 2.0 packs/day for 17.0 years (34.0 ttl pk-yrs)    Types: Cigarettes    Start date: 05/23/1971    Quit date: 05/22/1988    Years since quitting: 34.5   Smokeless tobacco: Never   Tobacco comments:    Quit 22 years ago, 1990  Vaping Use   Vaping status: Never Used  Substance Use Topics   Alcohol use: Yes    Alcohol/week: 6.0 standard drinks of alcohol    Types: 6 Standard drinks or equivalent per week    Comment: moderate [place] Authier wine 5 glasses per week   Drug use: No     Allergies   Cefdinir, Lisinopril, Meloxicam, Omnipen [ampicillin], Augmentin [amoxicillin-pot clavulanate], and Clarithromycin   Review of Systems Review of Systems   Physical Exam Triage Vital Signs ED Triage Vitals  Encounter Vitals Group     BP 12/14/22 1023 (!) 193/82     Systolic BP Percentile --      Diastolic BP Percentile --      Pulse Rate 12/14/22 1023 64     Resp 12/14/22 1023 18     Temp 12/14/22 1023 98.7 F (37.1 C)     Temp src --      SpO2 12/14/22 1023 98 %     Weight --      Height --      Head Circumference --      Peak Flow --      Pain Score 12/14/22 1003 2     Pain Loc --       Pain Education --      Exclude from Growth Chart --    No data found.  Updated Vital Signs BP (!) 193/82   Pulse 64   Temp 98.7 F (37.1 C)   Resp 18   SpO2 98%   Visual Acuity Right Eye Distance:   Left Eye Distance:   Bilateral Distance:    Right Eye Near:   Left Eye Near:    Bilateral Near:     Physical Exam Constitutional:      Appearance: Normal appearance.  Eyes:     Extraocular Movements: Extraocular movements intact.  Pulmonary:     Effort: Pulmonary effort is normal.  Feet:     Comments: Tenderness over the the dorsum aspect of the base and the proximal phalanx of the left great toe without ecchymosis, swelling or deformity, able to bear weight and to complete range of motion, sensation intact, capillary refill less than 3, 2+ pedal pulse Neurological:     Mental Status: She is alert and oriented to person, place, and time. Mental status is at baseline.      UC Treatments / Results  Labs (all labs ordered are listed, but only abnormal results are displayed) Labs Reviewed - No data to display  EKG   Radiology No results found.  Procedures Procedures (including critical care time)  Medications Ordered in UC Medications - No data to display  Initial Impression / Assessment and Plan / UC Course  I have reviewed the triage vital signs and the nursing notes.  Pertinent labs & imaging results that were available during my care of the patient were reviewed by me and considered in my medical decision making (see chart for details).  Left toe pain  X-ray  negative for injury to the bone, discussed with patient may continue use of over-the-counter analgesics and the RICE for management, may follow-up with urgent care or primary doctor for reevaluation as needed Final Clinical Impressions(s) / UC Diagnoses   Final diagnoses:  Toe pain, left   Discharge Instructions   None    ED Prescriptions   None    PDMP not reviewed this encounter.   Valinda Hoar, NP 12/14/22 1050

## 2022-12-14 NOTE — ED Triage Notes (Signed)
Patient to Urgent Care with complaints of left sided big toe pain following an injury that occurred two days ago.  States that she dropped a can on her big toe at the grocery store. Has been icing the area and taking advil.

## 2022-12-19 DIAGNOSIS — G4733 Obstructive sleep apnea (adult) (pediatric): Secondary | ICD-10-CM | POA: Diagnosis not present

## 2022-12-21 DIAGNOSIS — G4733 Obstructive sleep apnea (adult) (pediatric): Secondary | ICD-10-CM | POA: Diagnosis not present

## 2022-12-25 ENCOUNTER — Ambulatory Visit
Admission: RE | Admit: 2022-12-25 | Discharge: 2022-12-25 | Disposition: A | Discharge status: Home / Self Care | Payer: Medicare Other | Source: Ambulatory Visit | Attending: Family Medicine | Admitting: Family Medicine

## 2022-12-25 DIAGNOSIS — Z1231 Encounter for screening mammogram for malignant neoplasm of breast: Secondary | ICD-10-CM | POA: Insufficient documentation

## 2022-12-28 ENCOUNTER — Telehealth: Payer: Self-pay

## 2022-12-28 NOTE — Patient Outreach (Signed)
  Care Coordination   Initial Visit Note   12/28/2022 Name: Gina Harper MRN: [id number] DOB: 1950-04-21  Gina Harper is a 72 y.o. year old female who sees Excell Seltzer, MD for primary care. I spoke with  Gina Citron Fedorko by phone today.  What matters to the patients health and wellness today?  Patient states she is doing well.  Denies need for nursing and or community resource needs at this time.     Goals Addressed             This Visit's Progress    care coordination activities - no follow up needed.       Interventions Today    Flowsheet Row Most Recent Value  General Interventions   General Interventions Discussed/Reviewed General Interventions Discussed, Vaccines  [Care coordination services discussed. SDOH survey completed. AWV discussed and patient advised to contact provider office to schedule. Discussed vaccines. Advised to contact PCP office if care coordination services needed in the future.]  Vaccines --  [Advised patient to consider getting COVID, flu, pnemonia, shingles if she has not received.]              SDOH assessments and interventions completed:  Yes  SDOH Interventions Today    Flowsheet Row Most Recent Value  SDOH Interventions   Food Insecurity Interventions Intervention Not Indicated  Housing Interventions Intervention Not Indicated  Transportation Interventions Intervention Not Indicated        Care Coordination Interventions:  Yes, provided   Follow up plan: No further intervention required.   Encounter Outcome:  Patient Visit Completed   [facility] Care Coordination [phone] direct line

## 2022-12-30 ENCOUNTER — Encounter: Payer: Self-pay | Admitting: Podiatry

## 2022-12-30 ENCOUNTER — Ambulatory Visit: Payer: Medicare Other | Admitting: Podiatry

## 2022-12-30 DIAGNOSIS — G5782 Other specified mononeuropathies of left lower limb: Secondary | ICD-10-CM

## 2022-12-30 MED ORDER — TRIAMCINOLONE ACETONIDE 40 MG/ML IJ SUSP
20.0000 mg | Freq: Once | INTRAMUSCULAR | Status: AC
Start: 2022-12-30 — End: 2022-12-30
  Administered 2022-12-30: 20 mg

## 2022-12-30 NOTE — Progress Notes (Signed)
She presents today complaining of pain to the fourth toe of the left foot states that has been tender since the tenotomy procedure but it has worsened over the past 2 months.  She states is sore on the lateral corner of the toe but at the same time sore and here as she points to the plantar third interspace left.  Differential: Vital signs are stable alert and oriented x 3.  No reproducible pain on palpation to the fourth toe however she does have significant pain on palpation to the third interdigital space of the left foot with a palpable Mulder's click consistent with neuroma.  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  Plan: I injected with 10 mg of Kenalog today to the forefoot left.  She tolerated procedure well without complications we will follow-up with her in the next few weeks.  Should this worsen she will notify us otherwise we will see her then

## 2023-01-20 DIAGNOSIS — G4733 Obstructive sleep apnea (adult) (pediatric): Secondary | ICD-10-CM | POA: Diagnosis not present

## 2023-01-29 ENCOUNTER — Telehealth: Payer: Self-pay | Admitting: Pulmonary Disease

## 2023-01-29 NOTE — Telephone Encounter (Signed)
New message   Patient is not happy with [facility] asking to be switchover to [facility] phone [phone].

## 2023-02-02 ENCOUNTER — Encounter: Payer: Self-pay | Admitting: Pulmonary Disease

## 2023-02-02 DIAGNOSIS — G4733 Obstructive sleep apnea (adult) (pediatric): Secondary | ICD-10-CM

## 2023-02-03 NOTE — Telephone Encounter (Signed)
ATCx 1 LVM for patient to call our office back regarding prior message . Will sent order over to new DME of patient's choice.

## 2023-02-17 ENCOUNTER — Ambulatory Visit: Payer: Medicare Other | Admitting: Podiatry

## 2023-02-20 DIAGNOSIS — G4733 Obstructive sleep apnea (adult) (pediatric): Secondary | ICD-10-CM | POA: Diagnosis not present

## 2023-02-23 ENCOUNTER — Telehealth: Payer: Self-pay | Admitting: Pulmonary Disease

## 2023-02-23 NOTE — Telephone Encounter (Signed)
Please see last encounter. PT states she called and was told by the [facility] that all they were waiting for was the Dr. to sign the order. Please call PT to advise action taken and status, Thanks.  Her number is [phone]

## 2023-02-24 NOTE — Telephone Encounter (Signed)
dup

## 2023-02-24 NOTE — Telephone Encounter (Signed)
Please see last encounter. PT states she called and was told by the DME that all they were waiting for was the Dr. to sign the order. Please call PT to advise action taken and status, Thanks.  Her number is [phone]

## 2023-02-25 NOTE — Telephone Encounter (Signed)
Please check with DME to ensure nothing further needed.

## 2023-03-01 ENCOUNTER — Other Ambulatory Visit: Payer: Self-pay | Admitting: Family Medicine

## 2023-03-04 ENCOUNTER — Telehealth: Payer: Self-pay | Admitting: Family Medicine

## 2023-03-04 MED ORDER — HYDROCHLOROTHIAZIDE 25 MG PO TABS: 25.0000 mg | ORAL_TABLET | Freq: Every day | ORAL | 0 refills | Status: DC

## 2023-03-04 MED ORDER — ATORVASTATIN CALCIUM 10 MG PO TABS: 10.0000 mg | ORAL_TABLET | Freq: Every day | ORAL | 0 refills | Status: DC

## 2023-03-04 NOTE — Telephone Encounter (Signed)
Prescription Request  03/04/2023  LOV: 10/16/2022  What is the name of the medication or equipment? atorvastatin (LIPITOR) 10 MG tablet   hydrochlorothiazide (HYDRODIURIL) 25 MG tablet  Have you contacted your pharmacy to request a refill? No   Which pharmacy would you like this sent to?  [facility])   [facility] - [place], [place] - [address] [place] [address] Phone: [phone] Fax: [phone]    Patient notified that their request is being sent to the clinical staff for review and that they should receive a response within 2 business days.   Please advise at [facility] [phone]

## 2023-03-04 NOTE — Telephone Encounter (Signed)
Refill sent as requested. 

## 2023-03-16 DIAGNOSIS — H029 Unspecified disorder of eyelid: Secondary | ICD-10-CM | POA: Diagnosis not present

## 2023-03-16 DIAGNOSIS — Z961 Presence of intraocular lens: Secondary | ICD-10-CM | POA: Diagnosis not present

## 2023-03-16 DIAGNOSIS — H53002 Unspecified amblyopia, left eye: Secondary | ICD-10-CM | POA: Diagnosis not present

## 2023-03-20 ENCOUNTER — Other Ambulatory Visit: Payer: Self-pay | Admitting: Family Medicine

## 2023-03-22 ENCOUNTER — Encounter: Payer: Self-pay | Admitting: Family Medicine

## 2023-03-22 DIAGNOSIS — G4733 Obstructive sleep apnea (adult) (pediatric): Secondary | ICD-10-CM | POA: Diagnosis not present

## 2023-03-23 MED ORDER — FLUOXETINE HCL 10 MG PO CAPS: 10.0000 mg | ORAL_CAPSULE | Freq: Every day | ORAL | 0 refills | Status: DC

## 2023-04-15 ENCOUNTER — Telehealth: Payer: Self-pay | Admitting: *Deleted

## 2023-04-15 DIAGNOSIS — R7303 Prediabetes: Secondary | ICD-10-CM

## 2023-04-15 DIAGNOSIS — E538 Deficiency of other specified B group vitamins: Secondary | ICD-10-CM

## 2023-04-15 DIAGNOSIS — E7800 Pure hypercholesterolemia, unspecified: Secondary | ICD-10-CM

## 2023-04-15 NOTE — Telephone Encounter (Signed)
-----   Message from Alvina Chou sent at 04/15/2023  2:55 PM EST ----- Regarding: Lab order for Tue, 1.7.25 Patient is scheduled for CPX labs, please order future labs, Thanks , Camelia Eng

## 2023-04-22 DIAGNOSIS — G4733 Obstructive sleep apnea (adult) (pediatric): Secondary | ICD-10-CM | POA: Diagnosis not present

## 2023-04-27 ENCOUNTER — Other Ambulatory Visit: Payer: Medicare Other

## 2023-04-29 ENCOUNTER — Other Ambulatory Visit (INDEPENDENT_AMBULATORY_CARE_PROVIDER_SITE_OTHER): Payer: Medicare Other

## 2023-04-29 DIAGNOSIS — E538 Deficiency of other specified B group vitamins: Secondary | ICD-10-CM | POA: Diagnosis not present

## 2023-04-29 DIAGNOSIS — R7303 Prediabetes: Secondary | ICD-10-CM | POA: Diagnosis not present

## 2023-04-29 DIAGNOSIS — E7800 Pure hypercholesterolemia, unspecified: Secondary | ICD-10-CM

## 2023-04-29 LAB — COMPREHENSIVE METABOLIC PANEL
ALT: 17 U/L (ref 0–35)
AST: 18 U/L (ref 0–37)
Albumin: 4.7 g/dL (ref 3.5–5.2)
Alkaline Phosphatase: 85 U/L (ref 39–117)
BUN: 15 mg/dL (ref 6–23)
CO2: 32 meq/L (ref 19–32)
Calcium: 9.7 mg/dL (ref 8.4–10.5)
Chloride: 101 meq/L (ref 96–112)
Creatinine, Ser: 0.7 mg/dL (ref 0.40–1.20)
GFR: 85.86 mL/min (ref >60.00)
Glucose, Bld: 99 mg/dL (ref 70–99)
Potassium: 3.8 meq/L (ref 3.5–5.1)
Sodium: 142 meq/L (ref 135–145)
Total Bilirubin: 0.6 mg/dL (ref 0.2–1.2)
Total Protein: 7.3 g/dL (ref 6.0–8.3)

## 2023-04-29 LAB — LIPID PANEL
Cholesterol: 127 mg/dL (ref 0–200)
HDL: 47.4 mg/dL (ref >39.00)
LDL Cholesterol: 60 mg/dL (ref 0–99)
NonHDL: 79.63
Total CHOL/HDL Ratio: 3
Triglycerides: 99 mg/dL (ref 0.0–149.0)
VLDL: 19.8 mg/dL (ref 0.0–40.0)

## 2023-04-29 LAB — VITAMIN B12: Vitamin B-12: 845 pg/mL (ref 211–911)

## 2023-04-29 LAB — HEMOGLOBIN A1C: Hgb A1c MFr Bld: 5.6 % (ref 4.6–6.5)

## 2023-05-04 ENCOUNTER — Encounter: Payer: Self-pay | Admitting: Pulmonary Disease

## 2023-05-04 ENCOUNTER — Ambulatory Visit (INDEPENDENT_AMBULATORY_CARE_PROVIDER_SITE_OTHER): Payer: Medicare Other | Admitting: Family Medicine

## 2023-05-04 ENCOUNTER — Encounter: Payer: Self-pay | Admitting: Family Medicine

## 2023-05-04 VITALS — BP 120/70 | HR 72 | Temp 98.0°F | Ht 62.0 in | Wt 163.1 lb

## 2023-05-04 DIAGNOSIS — F5104 Psychophysiologic insomnia: Secondary | ICD-10-CM

## 2023-05-04 DIAGNOSIS — E538 Deficiency of other specified B group vitamins: Secondary | ICD-10-CM

## 2023-05-04 DIAGNOSIS — E7800 Pure hypercholesterolemia, unspecified: Secondary | ICD-10-CM | POA: Diagnosis not present

## 2023-05-04 DIAGNOSIS — R413 Other amnesia: Secondary | ICD-10-CM

## 2023-05-04 DIAGNOSIS — R7303 Prediabetes: Secondary | ICD-10-CM | POA: Diagnosis not present

## 2023-05-04 DIAGNOSIS — I700 Atherosclerosis of aorta: Secondary | ICD-10-CM | POA: Diagnosis not present

## 2023-05-04 DIAGNOSIS — I10 Essential (primary) hypertension: Secondary | ICD-10-CM | POA: Diagnosis not present

## 2023-05-04 DIAGNOSIS — G4733 Obstructive sleep apnea (adult) (pediatric): Secondary | ICD-10-CM

## 2023-05-04 DIAGNOSIS — J209 Acute bronchitis, unspecified: Secondary | ICD-10-CM | POA: Diagnosis not present

## 2023-05-04 DIAGNOSIS — Z0000 Encounter for general adult medical examination without abnormal findings: Secondary | ICD-10-CM

## 2023-05-04 DIAGNOSIS — F321 Major depressive disorder, single episode, moderate: Secondary | ICD-10-CM

## 2023-05-04 MED ORDER — DOXYCYCLINE HYCLATE 100 MG PO TABS: 100.0000 mg | ORAL_TABLET | Freq: Two times a day (BID) | ORAL | 0 refills | Status: DC

## 2023-05-04 NOTE — Progress Notes (Signed)
 No critical labs need to be addressed urgently. We will discuss labs in detail at upcoming office visit.

## 2023-05-04 NOTE — Assessment & Plan Note (Signed)
Chronic, stable control on Prozac 10 mg p.o. daily.

## 2023-05-04 NOTE — Assessment & Plan Note (Signed)
Stable, chronic.  Continue current medication.  HCTZ 25 mg daily 

## 2023-05-04 NOTE — Assessment & Plan Note (Signed)
B12 in the normal range on supplementation

## 2023-05-04 NOTE — Assessment & Plan Note (Signed)
Chronic, stable control with diet 

## 2023-05-04 NOTE — Assessment & Plan Note (Signed)
 Chronic, well controlled   Atorvastatin 10 mg daily

## 2023-05-04 NOTE — Assessment & Plan Note (Signed)
LDL goal < 70 on atorvastatin  

## 2023-05-04 NOTE — Assessment & Plan Note (Addendum)
 Followed by pulmonary  on CPAP... having trouble with getting to tolerate CPAp itself.

## 2023-05-04 NOTE — Progress Notes (Signed)
 Patient ID: Gina Harper, female    DOB: 02-07-1951, 73 y.o.   MRN: [id number]  This visit was conducted in person.  BP 120/70 (BP Location: Left Arm, Patient Position: Sitting, Cuff Size: Normal)   Pulse 72   Temp 98 F (36.7 C) (Temporal)   Ht 5' 2 (1.575 m)   Wt 163 lb 2 oz (74 kg)   SpO2 97%   BMI 29.84 kg/m    CC:  Chief Complaint  Patient presents with   Medicare Wellness    Subjective:   HPI: Gina Harper is a 73 y.o. female presenting on 05/04/2023 for Medicare Wellness  The patient presents for  medicare wellness,  complete physical and review of chronic health problems. He/She also has the following acute concerns today:   2 weeks of the  crud  Started with ST, progressed to productive cough... darker colored sputum.  No SOB, no wheeze.  Feeling fatigue.  Negative COVID test early on.  Former smoker  Using  mucinex  DM, codeine  cough suppressant  at night. Hx of exercise induced asthma   Having trouble dealing with CPAP I have personally reviewed the Medicare Annual Wellness questionnaire and have noted 1. The patient's medical and social history 2. Their use of alcohol, tobacco or illicit drugs 3. Their current medications and supplements 4. The patient's functional ability including ADL's, fall risks, home safety risks and hearing or visual             impairment. 5. Diet and physical activities 6. Evidence for depression or mood disorders 7.         Updated provider list Cognitive evaluation was performed and recorded on pt medicare questionnaire form. The patients weight, height, BMI and visual acuity have been recorded in the chart   I have made referrals, counseling and provided education to the patient based review of the above and I have provided the pt with a written personalized care plan for preventive services.   Documentation of this information was scanned into the electronic record under the media tab.   Advance directives and end  of life planning reviewed in detail with patient and documented in EMR. Patient given handout on advance care directives if needed. HCPOA and living will updated if needed.  Hearing Screening - Comments:: Wears Bilateral Hearing Aides Vision Screening - Comments:: Wears Glasses-Eye Exam at [facility]-Dr. Mittie 06/2022   No falls in last 12 months.    Pain in left breast.. nml mammogram in 12/2021.  No lumps noted.  The patient saw a LPN or RN for medicare wellness visit.  Prevention and wellness was reviewed in detail. Note reviewed and important notes copied below as needed.   Hypertension:  Well-controlled usually on HCTZ 25 mg daily  BP Readings from Last 3 Encounters:  05/04/23 120/70  12/14/22 (!) 193/82  11/30/22 116/68  Using medication without problems or lightheadedness: none Chest pain with exertion:none Edema:none Short of breath:none Average home BPs: not checking Other issues:  Prediabetes :  Improved Lab Results  Component Value Date   HGBA1C 5.6 04/29/2023    Elevated Cholesterol:  well controlled  on atorvastatin   10 mg daily Lab Results  Component Value Date   CHOL 127 04/29/2023   HDL 47.40 04/29/2023   LDLCALC 60 04/29/2023   TRIG 99.0 04/29/2023   CHOLHDL 3 04/29/2023  Using medications without problems:none Muscle aches: none Diet compliance: weight watchers Exercise: 1-2 times a week. Other complaints:  MDD  Prozac  10 mg p.o. daily Flowsheet Row Office Visit from 10/16/2022 in [facility] Total Score 0      B12 def:   B12 in the normal range on supplementation  Relevant past medical, surgical, family and social history reviewed and updated as indicated. Interim medical history since our last visit reviewed. Allergies and medications reviewed and updated. Outpatient Medications Prior to Visit  Medication Sig Dispense Refill   albuterol  (VENTOLIN  HFA) 108 (90 Base) MCG/ACT inhaler  Inhale 2 puffs into the lungs every 6 (six) hours as needed for wheezing or shortness of breath. 8 g 2   ALPHA LIPOIC ACID PO Take 1,200 mg by mouth daily.     atorvastatin  (LIPITOR) 10 MG tablet Take 1 tablet (10 mg total) by mouth daily. 100 tablet 0   Biotin 89999 MCG TABS Take 1 tablet by mouth daily.     budesonide -formoterol  (SYMBICORT ) 80-4.5 MCG/ACT inhaler Inhale 2 puffs into the lungs every 12 (twelve) hours as needed. 1 each 5   cetirizine (ZYRTEC) 10 MG tablet Take 10 mg by mouth as needed.      Cholecalciferol (VITAMIN D3) 2000 UNITS TABS Take by mouth daily.      docusate sodium (COLACE) 100 MG capsule Take 100 mg by mouth daily.     FLUoxetine  (PROZAC ) 10 MG capsule Take 1 capsule (10 mg total) by mouth daily. 100 capsule 0   fluticasone  (FLONASE ) 50 MCG/ACT nasal spray Place 1 spray into both nostrils daily. 18.2 mL 2   hydrochlorothiazide  (HYDRODIURIL ) 25 MG tablet Take 1 tablet (25 mg total) by mouth daily. 100 tablet 0   Magnesium Gluconate 500 (27 Mg) MG TABS Take 1 tablet by mouth daily.     Multiple Vitamin (MULTI-VITAMINS) TABS Take by mouth daily.     Potassium Gluconate 550 MG TABS Take by mouth daily.      promethazine  (PHENERGAN ) 25 MG suppository Place 1 suppository (25 mg total) rectally every 6 (six) hours as needed. 12 each 1   vitamin B-12 (CYANOCOBALAMIN ) 1000 MCG tablet Take 1,000 mcg by mouth daily.     Magnesium 250 MG TABS Take 250 mg by mouth daily.     No facility-administered medications prior to visit.     Per HPI unless specifically indicated in ROS section below Review of Systems  Constitutional:  Negative for fatigue and fever.  HENT:  Negative for congestion.   Eyes:  Negative for pain.  Respiratory:  Negative for cough and shortness of breath.        Snoring  Cardiovascular:  Negative for chest pain, palpitations and leg swelling.  Gastrointestinal:  Negative for abdominal pain.  Genitourinary:  Negative for dysuria and vaginal bleeding.   Musculoskeletal:  Negative for back pain.  Skin:  Negative for rash.  Neurological:  Negative for syncope, light-headedness and headaches.  Psychiatric/Behavioral:  Negative for dysphoric mood.    Objective:  BP 120/70 (BP Location: Left Arm, Patient Position: Sitting, Cuff Size: Normal)   Pulse 72   Temp 98 F (36.7 C) (Temporal)   Ht 5' 2 (1.575 m)   Wt 163 lb 2 oz (74 kg)   SpO2 97%   BMI 29.84 kg/m   Wt Readings from Last 3 Encounters:  05/04/23 163 lb 2 oz (74 kg)  11/30/22 169 lb (76.7 kg)  10/16/22 170 lb (77.1 kg)      Physical Exam Vitals and nursing note reviewed.  Constitutional:  General: She is not in acute distress.    Appearance: Normal appearance. She is well-developed. She is not ill-appearing or toxic-appearing.  HENT:     Head: Normocephalic.     Right Ear: Hearing, tympanic membrane, ear canal and external ear normal.     Left Ear: Hearing, tympanic membrane, ear canal and external ear normal.     Nose: Nose normal.  Eyes:     General: Lids are normal. Lids are everted, no foreign bodies appreciated.     Conjunctiva/sclera: Conjunctivae normal.     Pupils: Pupils are equal, round, and reactive to light.  Neck:     Thyroid : No thyroid  mass or thyromegaly.     Vascular: No carotid bruit.     Trachea: Trachea normal.  Cardiovascular:     Rate and Rhythm: Normal rate and regular rhythm.     Heart sounds: Normal heart sounds, S1 normal and S2 normal. No murmur heard.    No gallop.  Pulmonary:     Effort: Pulmonary effort is normal. No respiratory distress.     Breath sounds: Normal breath sounds. No wheezing, rhonchi or rales.  Chest:  Breasts:    Breasts are symmetrical.     Right: Normal. No inverted nipple, mass or nipple discharge.     Left: Normal. No inverted nipple, mass or nipple discharge.     Comments: No current tenderness on exam. Abdominal:     General: Bowel sounds are normal. There is no distension or abdominal bruit.      Palpations: Abdomen is soft. There is no fluid wave or mass.     Tenderness: There is no abdominal tenderness. There is no guarding or rebound.     Hernia: No hernia is present.  Musculoskeletal:     Cervical back: Normal range of motion and neck supple.  Lymphadenopathy:     Cervical: No cervical adenopathy.  Skin:    General: Skin is warm and dry.     Findings: No rash.  Neurological:     Mental Status: She is alert.     Cranial Nerves: No cranial nerve deficit.     Sensory: No sensory deficit.  Psychiatric:        Mood and Affect: Mood is not anxious or depressed.        Speech: Speech normal.        Behavior: Behavior normal. Behavior is cooperative.        Judgment: Judgment normal.       Results for orders placed or performed in visit on 04/29/23  Vitamin B12   Collection Time: 04/29/23  9:00 AM  Result Value Ref Range   Vitamin B-12 845 211 - 911 pg/mL  Comprehensive metabolic panel   Collection Time: 04/29/23  9:00 AM  Result Value Ref Range   Sodium 142 135 - 145 mEq/L   Potassium 3.8 3.5 - 5.1 mEq/L   Chloride 101 96 - 112 mEq/L   CO2 32 19 - 32 mEq/L   Glucose, Bld 99 70 - 99 mg/dL   BUN 15 6 - 23 mg/dL   Creatinine, Ser 9.29 0.40 - 1.20 mg/dL   Total Bilirubin 0.6 0.2 - 1.2 mg/dL   Alkaline Phosphatase 85 39 - 117 U/L   AST 18 0 - 37 U/L   ALT 17 0 - 35 U/L   Total Protein 7.3 6.0 - 8.3 g/dL   Albumin 4.7 3.5 - 5.2 g/dL   GFR 14.13 >39.99 mL/min   Calcium  9.7 8.4 -  10.5 mg/dL  Hemoglobin J8r   Collection Time: 04/29/23  9:00 AM  Result Value Ref Range   Hgb A1c MFr Bld 5.6 4.6 - 6.5 %  Lipid panel   Collection Time: 04/29/23  9:00 AM  Result Value Ref Range   Cholesterol 127 0 - 200 mg/dL   Triglycerides 00.9 0.0 - 149.0 mg/dL   HDL 52.59 >60.99 mg/dL   VLDL 80.1 0.0 - 59.9 mg/dL   LDL Cholesterol 60 0 - 99 mg/dL   Total CHOL/HDL Ratio 3    NonHDL 79.63      COVID 19 screen:  No recent travel or known exposure to COVID19 The patient denies  respiratory symptoms of COVID 19 at this time. The importance of social distancing was discussed today.   Assessment and Plan The patient's preventative maintenance and recommended screening tests for an annual wellness exam were reviewed in full today. Brought up to date unless services declined.  Counselled on the importance of diet, exercise, and its role in overall health and mortality. The patient's FH and SH was reviewed, including their home life, tobacco status, and drug and alcohol status.    PAP not indicated given partial hysterectomy, no family history of ovarian cancer. Mammogram 12/2022 nml  Colonoscopy 09/2020 Dr. Aundria,  no further indicated.  Flu vaccine, pneumonia uptodate, COVID vaccine x 3,  rx given for shingrix vaccine in 2021, due for Td  Hep c neg  DEXA: 2017  Osteopenia,  improved  osteopenia on 12/19/2020 .SABRA  Repeat in 2027  Problem List Items Addressed This Visit     Aortic atherosclerosis (HCC) (Chronic)   LDL goal < 70 on atorvastatin        B12 deficiency (Chronic)   B12 in the normal range on supplementation      Chronic insomnia (Chronic)    Consider trial of trazodone if melatonin and fixing CPAP not helpful.      Essential hypertension, benign (Chronic)   Stable, chronic.  Continue current medication.     HCTZ 25 mg daily      MDD (major depressive disorder), single episode, moderate (HCC)   Chronic, stable control on Prozac  10 mg p.o. daily.      Memory loss    Recommend sleep apnea treatment, regular exercise and adequate sleep.      Prediabetes (Chronic)   Chronic, stable control with diet      Pure hypercholesterolemia (Chronic)    Chronic, well controlled   Atorvastatin  10 mg daily      Severe obstructive sleep apnea    Followed by pulmonary  on CPAP... having trouble with getting to tolerate CPAp itself.      Other Visit Diagnoses       Medicare annual wellness visit, subsequent    -  Primary     Acute bronchitis,  unspecified organism             Greig Ring, MD

## 2023-05-04 NOTE — Assessment & Plan Note (Signed)
 Consider trial of trazodone if melatonin and fixing CPAP not helpful.

## 2023-05-04 NOTE — Assessment & Plan Note (Signed)
 Recommend sleep apnea treatment, regular exercise and adequate sleep.

## 2023-05-06 NOTE — Telephone Encounter (Signed)
Please place order to change CPAP setting to 5-14 cmH2O. Thanks.

## 2023-05-06 NOTE — Telephone Encounter (Signed)
Gina Harper,  This patient is having problems with the pressure on her CPAP machine.  I printed out a DL for review.  Dr. Wynona Neat is out of the office until next week.  Can you please review the DL and make recommendations?  Thank you.

## 2023-05-13 NOTE — Telephone Encounter (Signed)
Order has been placed nfn

## 2023-05-23 DIAGNOSIS — G4733 Obstructive sleep apnea (adult) (pediatric): Secondary | ICD-10-CM | POA: Diagnosis not present

## 2023-05-27 ENCOUNTER — Ambulatory Visit: Payer: Medicare Other | Admitting: Pulmonary Disease

## 2023-05-27 ENCOUNTER — Other Ambulatory Visit: Payer: Self-pay | Admitting: Family Medicine

## 2023-05-27 ENCOUNTER — Encounter: Payer: Self-pay | Admitting: Pulmonary Disease

## 2023-05-27 VITALS — BP 128/68 | HR 68 | Temp 97.9°F | Ht 62.0 in | Wt 161.0 lb

## 2023-05-27 DIAGNOSIS — G4733 Obstructive sleep apnea (adult) (pediatric): Secondary | ICD-10-CM

## 2023-05-27 NOTE — Progress Notes (Signed)
 Gina Harper    [id number]    1950-09-25  Primary Care Physician:Bedsole, Greig BRAVO, MD  Referring Physician: Avelina Greig BRAVO, MD [address] [place],  [place] [address]  Chief complaint:   Patient being seen for obstructive sleep apnea  HPI:  Diagnosed with severe obstructive sleep apnea  Has been trying to use her CPAP regularly Tolerating reduced pressures better  Most times waking up feeling like she is at a good nights rest  History of asthma -Uses albuterol  as needed  Reformed smoker, 34-pack-year smoking history   She does have some CPAP mask issues Sometimes feel the pressure is too much  Has no difficulty putting the mask on at night and falling asleep  Outpatient Encounter Medications as of 05/27/2023  Medication Sig   albuterol  (VENTOLIN  HFA) 108 (90 Base) MCG/ACT inhaler Inhale 2 puffs into the lungs every 6 (six) hours as needed for wheezing or shortness of breath.   ALPHA LIPOIC ACID PO Take 1,200 mg by mouth daily.   atorvastatin  (LIPITOR) 10 MG tablet Take 1 tablet (10 mg total) by mouth daily.   Biotin 89999 MCG TABS Take 1 tablet by mouth daily.   budesonide -formoterol  (SYMBICORT ) 80-4.5 MCG/ACT inhaler Inhale 2 puffs into the lungs every 12 (twelve) hours as needed.   cetirizine (ZYRTEC) 10 MG tablet Take 10 mg by mouth as needed.    Cholecalciferol (VITAMIN D3) 2000 UNITS TABS Take by mouth daily.    docusate sodium (COLACE) 100 MG capsule Take 100 mg by mouth daily.   doxycycline  (VIBRA -TABS) 100 MG tablet Take 1 tablet (100 mg total) by mouth 2 (two) times daily.   FLUoxetine  (PROZAC ) 10 MG capsule Take 1 capsule (10 mg total) by mouth daily.   fluticasone  (FLONASE ) 50 MCG/ACT nasal spray Place 1 spray into both nostrils daily.   hydrochlorothiazide  (HYDRODIURIL ) 25 MG tablet Take 1 tablet (25 mg total) by mouth daily.   Magnesium Gluconate 500 (27 Mg) MG TABS Take 1 tablet by mouth daily.   Multiple Vitamin (MULTI-VITAMINS) TABS  Take by mouth daily.   Potassium Gluconate 550 MG TABS Take by mouth daily.    promethazine  (PHENERGAN ) 25 MG suppository Place 1 suppository (25 mg total) rectally every 6 (six) hours as needed.   vitamin B-12 (CYANOCOBALAMIN ) 1000 MCG tablet Take 1,000 mcg by mouth daily.   No facility-administered encounter medications on file as of 05/27/2023.    Allergies as of 05/27/2023 - Review Complete 05/27/2023  Allergen Reaction Noted   Cefdinir Hives 07/04/2014   Lisinopril  Cough and Other (See Comments) 01/31/2014   Meloxicam Swelling 08/15/2012   Omnipen [ampicillin] Other (See Comments) 08/15/2012   Augmentin [amoxicillin-pot clavulanate] Rash 02/08/2012   Clarithromycin Rash 07/04/2014    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HCC)    rectal spasm,syncope, nausea, ongoing for years   Allergic rhinitis, cause unspecified    Allergy    Alopecia, unspecified    Anemia    pernicious anemia   Arthritis    hands, knees, hips   Asthma    Cancer (HCC)    melanoma   Chicken pox    Depressive disorder, not elsewhere classified    Disorder of bone and cartilage, unspecified    Esophageal reflux    GERD (gastroesophageal reflux disease)    Hepatitis    History of shingles    Insomnia, unspecified    Measles    red measles  Motion sickness    Mumps    Other abnormal glucose    Palpitations    Syncope, vasovagal    Tobacco use disorder    Unspecified asthma(493.90)    exercise induced. Never hospitalized   Unspecified essential hypertension    Unspecified hearing loss    Unspecified pruritic disorder    Wears hearing aid in both ears     Past Surgical History:  Procedure Laterality Date   bladder tact  1995   BROW LIFT Bilateral 07/09/2015   Procedure: BLEPHAROPLASTY;  Surgeon: Greig CHRISTELLA Gay, MD;  Location: [facility];  Service: Ophthalmology;  Laterality: Bilateral;   BUNIONECTOMY  [id number]   bilateral   CATARACT EXTRACTION W/PHACO Left  08/05/2022   Procedure: CATARACT EXTRACTION PHACO AND INTRAOCULAR LENS PLACEMENT (IOC) LEFT  8.47  01:06.2;  Surgeon: Mittie Gaskin, MD;  Location: [facility];  Service: Ophthalmology;  Laterality: Left;   CATARACT EXTRACTION W/PHACO Right 08/19/2022   Procedure: CATARACT EXTRACTION PHACO AND INTRAOCULAR LENS PLACEMENT (IOC) RIGHT [place]  TORIC  4.43  00:33.2;  Surgeon: Mittie Gaskin, MD;  Location: [facility];  Service: Ophthalmology;  Laterality: Right;   colonoscopy  04/20/2004   no polyps; normal.  Repeat in 10 years.  [place].   COLONOSCOPY WITH PROPOFOL  N/A 05/20/2015   Procedure: COLONOSCOPY WITH PROPOFOL ;  Surgeon: Gladis RAYMOND Mariner, MD;  Location: [facility];  Service: Endoscopy;  Laterality: N/A;   COLONOSCOPY WITH PROPOFOL  N/A 09/25/2020   Procedure: COLONOSCOPY WITH PROPOFOL ;  Surgeon: Toledo, Ladell POUR, MD;  Location: [facility];  Service: Gastroenterology;  Laterality: N/A;   COSMETIC SURGERY     rhinoplasty   ESOPHAGOGASTRODUODENOSCOPY  03/30/2005   PTOSIS REPAIR Bilateral 07/09/2015   Procedure: PTOSIS REPAIR;  Surgeon: Greig CHRISTELLA Gay, MD;  Location: [facility];  Service: Ophthalmology;  Laterality: Bilateral;   rhinoplasty with septoplasty     TONSILLECTOMY AND ADENOIDECTOMY     TUBAL LIGATION     VAGINAL HYSTERECTOMY  1997   menorrhagia ovaries intact    Family History  Problem Relation Age of Onset   Cancer Mother        Lung, Bladder   Stroke Father 69       TIAs   Heart disease Father 39       AMI x 2.   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  Tobacco Use   Smoking status: Former    Current packs/day: 0.00    Average packs/day: 2.0 packs/day for 17.0 years (34.0 ttl pk-yrs)    Types: Cigarettes    Start date: 05/23/1971    Quit date: 05/22/1988    Years since quitting: 35.0   Smokeless tobacco: Never   Tobacco comments:    Quit 22 years ago, 1990  Vaping Use   Vaping status: Never Used  Substance and Sexual Activity   Alcohol use: Yes    Alcohol/week: 6.0 standard drinks of alcohol    Types: 6 Standard drinks or  equivalent per week    Comment: moderate white wine 5 glasses per week   Drug use: No   Sexual activity: Yes    Partners: Male    Birth control/protection: Post-menopausal, Surgical  Other Topics Concern   Not on file  Social History Narrative      Marital status:  Married x 40 years, happily married; no domestic abuse.      Children:  1 child; 3 grandchildren local.      Employment:  Retired in 09/2012; [employer] x 21 years; happy.  Pre-admission testing.      Tobacco:  Previous smoker. Quit 1990      Alcohol:  2 glasses of wine three nights per week.      Drugs; none      Exercise:  Treadmill, elliptical, Zumba.  [employer] 3x per week.      Seatbelt:  Always uses seat belts.       +Smoke alarm and carbon monoxide detector in the home.       Guns:  Guns stored in locked cabinet.       Caffeine use: Coffee, Tea, Carbonated beverages, 3 servings / day.                 Social Drivers of Corporate Investment Banker Strain: Low Risk  (10/16/2022)   Overall Financial Resource Strain (CARDIA)    Difficulty of Paying Living Expenses: Not hard at all  Food Insecurity: No Food Insecurity (12/28/2022)   Hunger Vital Sign    Worried About Running Out of Food in the Last Year: Never true    Ran Out of Food in the Last Year: Never true  Transportation Needs: No Transportation Needs (12/28/2022)   PRAPARE - Administrator, Civil Service (Medical): No    Lack of Transportation (Non-Medical): No   Physical Activity: Insufficiently Active (10/16/2022)   Exercise Vital Sign    Days of Exercise per Week: 3 days    Minutes of Exercise per Session: 30 min  Stress: No Stress Concern Present (10/16/2022)   [employer] of Occupational Health - Occupational Stress Questionnaire    Feeling of Stress : Not at all  Social Connections: Socially Integrated (10/16/2022)   Social Connection and Isolation Panel [NHANES]    Frequency of Communication with Friends and Family: More than three times a week    Frequency of Social Gatherings with Friends and Family: Twice a week    Attends Religious Services: More than 4 times per year    Active Member of [employer] or Organizations: Yes    Attends Engineer, Structural: More than 4 times per year    Marital Status: Married  Catering Manager Violence: Not At Risk (04/24/2022)   Humiliation, Afraid, Rape, and Kick questionnaire    Fear of Current or Ex-Partner: No    Emotionally Abused: No    Physically Abused: No    Sexually Abused: No    Review of Systems  Constitutional:  Negative for fatigue.  Respiratory:  Positive for apnea.   Psychiatric/Behavioral:  Positive for sleep disturbance.     Vitals:   05/27/23 1101  BP: 128/68  Pulse: 68  Temp: 97.9 F (36.6 C)  SpO2: 96%     Physical Exam Constitutional:      Appearance: Normal appearance.  HENT:     Head: Normocephalic.     Nose: Nose normal.     Mouth/Throat:     Mouth: Mucous membranes are moist.  Eyes:  General: No scleral icterus. Cardiovascular:     Rate and Rhythm: Normal rate and regular rhythm.     Heart sounds: No murmur heard.    No friction rub.  Pulmonary:     Effort: No respiratory distress.     Breath sounds: No stridor. No wheezing or rhonchi.  Musculoskeletal:     Cervical back: No rigidity or tenderness.  Neurological:     Mental Status: She is alert.  Psychiatric:        Mood and Affect: Mood normal.      Data Reviewed: Previous sleep  study result reviewed  Compliance data reviewed showing excellent compliance with CPAP Compliance of 47% Average use of 5 hours 58 minutes Residual AHI of 4.0  Sleep study with AHI over 50  Assessment:  Severe obstructive sleep apnea -Tolerating CPAP better but compliance still remains poor  History of asthma -Albuterol  as needed Plan/Recommendations: Encouraged to continue CPAP nightly  Encouraged to call with significant concerns  Follow-up in 6 months   Gina Epley MD [facility] 05/27/2023, 8:30 PM  CC: Avelina Greig BRAVO, MD

## 2023-05-27 NOTE — Patient Instructions (Addendum)
 I will see you back in 6 months  Continue using CPAP nightly  Download from the machine shows it is working well  Call us  with significant concerns

## 2023-05-28 ENCOUNTER — Other Ambulatory Visit: Payer: Self-pay | Admitting: Family Medicine

## 2023-06-20 DIAGNOSIS — G4733 Obstructive sleep apnea (adult) (pediatric): Secondary | ICD-10-CM | POA: Diagnosis not present

## 2023-06-24 ENCOUNTER — Other Ambulatory Visit: Payer: Self-pay | Admitting: Family Medicine

## 2023-06-30 ENCOUNTER — Telehealth: Payer: Medicare Other | Admitting: Nurse Practitioner

## 2023-06-30 ENCOUNTER — Ambulatory Visit: Admitting: Podiatry

## 2023-07-12 ENCOUNTER — Ambulatory Visit (INDEPENDENT_AMBULATORY_CARE_PROVIDER_SITE_OTHER)

## 2023-07-12 ENCOUNTER — Encounter: Payer: Self-pay | Admitting: Podiatry

## 2023-07-12 ENCOUNTER — Ambulatory Visit: Admitting: Podiatry

## 2023-07-12 DIAGNOSIS — M898X7 Other specified disorders of bone, ankle and foot: Secondary | ICD-10-CM | POA: Diagnosis not present

## 2023-07-12 DIAGNOSIS — D2371 Other benign neoplasm of skin of right lower limb, including hip: Secondary | ICD-10-CM | POA: Diagnosis not present

## 2023-07-12 DIAGNOSIS — M2041 Other hammer toe(s) (acquired), right foot: Secondary | ICD-10-CM | POA: Diagnosis not present

## 2023-07-12 NOTE — Progress Notes (Signed)
 She presents today after having not seen her for a little less than a year stating that this is really starting to bother me as she refers to her hallux right and her second toe right.  She states that the to rub together and it is starting to affect her ability to wear her regular shoes and ability to perform daily activities such as exercising to maintain her general good health.  No changes in her past medical history medications allergies since I saw her last.  Objective: Vital signs are stable alert oriented x 3.  There is mild erythema and edema to the lateral aspect of the hallux interphalangeal joint right and the medial aspect of the PIPJ second digit right foot.  Radiographs taken today do demonstrate hypertrophic condyles as well as exostoses.  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  Plan: Discussed etiology pathology conservative surgical therapies discussed conservative therapies padding.  She would like to have this surgically taken care of so that she does not have to go through this on a daily basis.  We consented her today for a lateral exostectomy of the interphalangeal joint of the hallux right medial exostectomy of the PIPJ second digit right foot.  I answered all questions regarding these procedures to the best of my ability in layman's terms.  She understood and is amenable to it signed out 3 pages a consent form.  She has her Darco shoe at home which she will bring.

## 2023-07-14 ENCOUNTER — Telehealth: Payer: Self-pay | Admitting: Podiatry

## 2023-07-14 NOTE — Telephone Encounter (Signed)
 REF# [id number]

## 2023-07-14 NOTE — Telephone Encounter (Signed)
 DOS: 07/30/23   (R) 1ST & 2ND EXOSTECTOMY    EFFECTIVE DATE: 04/20/21  DEDUCTIBLE:  $0  REMAINING: $0  OOP:  $3,900.00  REMAINING: $3,845.38   CO INSURANCE : 0% EPR THE [employer] SITE NO PRIOR AUTH IS REQ FOR CPT CODE 16109

## 2023-07-20 ENCOUNTER — Encounter: Payer: Self-pay | Admitting: Family Medicine

## 2023-07-20 ENCOUNTER — Ambulatory Visit: Admitting: Family Medicine

## 2023-07-20 VITALS — BP 130/78 | HR 60 | Temp 98.0°F | Ht 62.0 in | Wt 166.0 lb

## 2023-07-20 DIAGNOSIS — Z01818 Encounter for other preprocedural examination: Secondary | ICD-10-CM | POA: Diagnosis not present

## 2023-07-20 NOTE — Assessment & Plan Note (Addendum)
 Upcoming minor surgery on toe with local anesthesia and conscious sedation. Patient without diabetes but does have venous insufficiency. She is able to do 4 METS or greater of regular exercise without symptoms.  Her asthma is well-controlled and she is not requiring Symbicort or albuterol. She is not on an anticoagulant. She is low risk for this low risk procedure.  No further evaluation needed.

## 2023-07-20 NOTE — Progress Notes (Signed)
 Patient ID: Gina Harper, female    DOB: [birth year], 73 y.o.   MRN: [id number]  This visit was conducted in person.  BP 130/78 (BP Location: Left Arm, Patient Position: Sitting, Cuff Size: Normal)   Pulse 60   Temp 98 F (36.7 C) (Temporal)   Ht 5\' 2"  (1.575 m)   Wt 166 lb (75.3 kg)   SpO2 97%   BMI 30.36 kg/m    CC:  Chief Complaint  Patient presents with   Surgical Clearance    Minor Toe Surgery 07/30/22 with Dr. Al Corpus    Subjective:   HPI: Gina Harper is a 73 y.o. female presenting on 07/20/2023 for Surgical Clearance (Minor Toe Surgery 07/30/22 with Dr. Al Corpus)   Has upcoming minor toe surgery 07/30/2023 Dr. Al Corpus.  Having local anesthesia in hospital with conscious sedation.  No intubation.  Gina Harper has a history of hypertension, prediabetes, high cholesterol and aortic atherosclerosis.  She is not currently on an anticoagulant. She has severe obstructive sleep apnea treated with CPAP, she also has well-controlled moderate asthma treated with Symbicort 2 puffs twice daily ( rarely uses it) and albuterol as needed ( rarely using as well)  Hypertension:  Blood pressure is well-controlled with hydrochlorothiazide 25 mg daily, BP Readings from Last 3 Encounters:  07/20/23 130/78  05/27/23 128/68  05/04/23 120/70  Using medication without problems or lightheadedness:  none Chest pain with exertion: none Edema: none Short of breath:none Average home BPs: Other issues:    No past problems with colonoscopy or other past surgery.   She is able to exercise  regularly /climb stairs without SOB and CP.  Exercises 1-2 times a week.    Lab Results  Component Value Date   HGBA1C 5.6 04/29/2023   Wt Readings from Last 3 Encounters:  07/20/23 166 lb (75.3 kg)  05/27/23 161 lb (73 kg)  05/04/23 163 lb 2 oz (74 kg)     Relevant past medical, surgical, family and social history reviewed and updated as indicated. Interim medical history since our last visit  reviewed. Allergies and medications reviewed and updated. Outpatient Medications Prior to Visit  Medication Sig Dispense Refill   albuterol (VENTOLIN HFA) 108 (90 Base) MCG/ACT inhaler Inhale 2 puffs into the lungs every 6 (six) hours as needed for wheezing or shortness of breath. 8 g 2   ALPHA LIPOIC ACID PO Take 1,200 mg by mouth daily.     atorvastatin (LIPITOR) 10 MG tablet TAKE 1 TABLET BY MOUTH DAILY 100 tablet 2   Biotin 40981 MCG TABS Take 1 tablet by mouth daily.     budesonide-formoterol (SYMBICORT) 80-4.5 MCG/ACT inhaler Inhale 2 puffs into the lungs every 12 (twelve) hours as needed. 1 each 5   cetirizine (ZYRTEC) 10 MG tablet Take 10 mg by mouth as needed.      Cholecalciferol (VITAMIN D3) 2000 UNITS TABS Take by mouth daily.      docusate sodium (COLACE) 100 MG capsule Take 100 mg by mouth daily.     FLUoxetine (PROZAC) 10 MG capsule TAKE 1 CAPSULE BY MOUTH DAILY 100 capsule 1   hydrochlorothiazide (HYDRODIURIL) 25 MG tablet TAKE 1 TABLET BY MOUTH DAILY 100 tablet 2   Magnesium Gluconate 500 (27 Mg) MG TABS Take 1 tablet by mouth daily.     Multiple Vitamin (MULTI-VITAMINS) TABS Take by mouth daily.     Potassium Gluconate 550 MG TABS Take by mouth daily.      promethazine (PHENERGAN) 25 MG  suppository Place 1 suppository (25 mg total) rectally every 6 (six) hours as needed. 12 each 1   vitamin B-12 (CYANOCOBALAMIN) 1000 MCG tablet Take 1,000 mcg by mouth daily.     doxycycline (VIBRA-TABS) 100 MG tablet Take 1 tablet (100 mg total) by mouth 2 (two) times daily. 20 tablet 0   fluticasone (FLONASE) 50 MCG/ACT nasal spray Place 1 spray into both nostrils daily. 18.2 mL 2   No facility-administered medications prior to visit.     Per HPI unless specifically indicated in ROS section below Review of Systems  Constitutional:  Negative for fatigue and fever.  HENT:  Negative for congestion.   Eyes:  Negative for pain.  Respiratory:  Negative for cough and shortness of breath.    Cardiovascular:  Negative for chest pain, palpitations and leg swelling.  Gastrointestinal:  Negative for abdominal pain.  Genitourinary:  Negative for dysuria and vaginal bleeding.  Musculoskeletal:  Negative for back pain.  Neurological:  Negative for syncope, light-headedness and headaches.  Psychiatric/Behavioral:  Negative for dysphoric mood.    Objective:  BP 130/78 (BP Location: Left Arm, Patient Position: Sitting, Cuff Size: Normal)   Pulse 60   Temp 98 F (36.7 C) (Temporal)   Ht 5\' 2"  (1.575 m)   Wt 166 lb (75.3 kg)   SpO2 97%   BMI 30.36 kg/m   Wt Readings from Last 3 Encounters:  07/20/23 166 lb (75.3 kg)  05/27/23 161 lb (73 kg)  05/04/23 163 lb 2 oz (74 kg)      Physical Exam Constitutional:      General: She is not in acute distress.    Appearance: Normal appearance. She is well-developed. She is not ill-appearing or toxic-appearing.  HENT:     Head: Normocephalic.     Right Ear: Hearing, tympanic membrane, ear canal and external ear normal. Tympanic membrane is not erythematous, retracted or bulging.     Left Ear: Hearing, tympanic membrane, ear canal and external ear normal. Tympanic membrane is not erythematous, retracted or bulging.     Nose: No mucosal edema or rhinorrhea.     Right Sinus: No maxillary sinus tenderness or frontal sinus tenderness.     Left Sinus: No maxillary sinus tenderness or frontal sinus tenderness.     Mouth/Throat:     Pharynx: Uvula midline.  Eyes:     General: Lids are normal. Lids are everted, no foreign bodies appreciated.     Conjunctiva/sclera: Conjunctivae normal.     Pupils: Pupils are equal, round, and reactive to light.  Neck:     Thyroid: No thyroid mass or thyromegaly.     Vascular: No carotid bruit.     Trachea: Trachea normal.  Cardiovascular:     Rate and Rhythm: Normal rate and regular rhythm.     Pulses: Normal pulses.     Heart sounds: Normal heart sounds, S1 normal and S2 normal. No murmur heard.    No  friction rub. No gallop.  Pulmonary:     Effort: Pulmonary effort is normal. No tachypnea or respiratory distress.     Breath sounds: Normal breath sounds. No decreased breath sounds, wheezing, rhonchi or rales.  Abdominal:     General: Bowel sounds are normal.     Palpations: Abdomen is soft.     Tenderness: There is no abdominal tenderness.  Musculoskeletal:     Cervical back: Normal range of motion and neck supple.  Skin:    General: Skin is warm and dry.  Findings: No rash.  Neurological:     Mental Status: She is alert.  Psychiatric:        Mood and Affect: Mood is not anxious or depressed.        Speech: Speech normal.        Behavior: Behavior normal. Behavior is cooperative.        Thought Content: Thought content normal.        Judgment: Judgment normal.       Results for orders placed or performed in visit on 04/29/23  Vitamin B12   Collection Time: 04/29/23  9:00 AM  Result Value Ref Range   Vitamin B-12 845 211 - 911 pg/mL  Comprehensive metabolic panel   Collection Time: 04/29/23  9:00 AM  Result Value Ref Range   Sodium 142 135 - 145 mEq/L   Potassium 3.8 3.5 - 5.1 mEq/L   Chloride 101 96 - 112 mEq/L   CO2 32 19 - 32 mEq/L   Glucose, Bld 99 70 - 99 mg/dL   BUN 15 6 - 23 mg/dL   Creatinine, Ser 1.47 0.40 - 1.20 mg/dL   Total Bilirubin 0.6 0.2 - 1.2 mg/dL   Alkaline Phosphatase 85 39 - 117 U/L   AST 18 0 - 37 U/L   ALT 17 0 - 35 U/L   Total Protein 7.3 6.0 - 8.3 g/dL   Albumin 4.7 3.5 - 5.2 g/dL   GFR 82.95 >62.13 mL/min   Calcium 9.7 8.4 - 10.5 mg/dL  Hemoglobin Y8M   Collection Time: 04/29/23  9:00 AM  Result Value Ref Range   Hgb A1c MFr Bld 5.6 4.6 - 6.5 %  Lipid panel   Collection Time: 04/29/23  9:00 AM  Result Value Ref Range   Cholesterol 127 0 - 200 mg/dL   Triglycerides 57.8 0.0 - 149.0 mg/dL   HDL 46.96 >29.52 mg/dL   VLDL 84.1 0.0 - 32.4 mg/dL   LDL Cholesterol 60 0 - 99 mg/dL   Total CHOL/HDL Ratio 3    NonHDL 79.63      Assessment and Plan  Pre-op evaluation Assessment & Plan: Upcoming minor surgery on toe with local anesthesia and conscious sedation. Patient without diabetes but does have venous insufficiency. She is able to do 4 METS or greater of regular exercise without symptoms.  Her asthma is well-controlled and she is not requiring Symbicort or albuterol. She is not on an anticoagulant. She is low risk for this low risk procedure.  No further evaluation needed.     No follow-ups on file.   Kerby Nora, MD

## 2023-07-21 DIAGNOSIS — G4733 Obstructive sleep apnea (adult) (pediatric): Secondary | ICD-10-CM | POA: Diagnosis not present

## 2023-07-28 ENCOUNTER — Other Ambulatory Visit: Payer: Self-pay | Admitting: Podiatry

## 2023-07-28 MED ORDER — ONDANSETRON HCL 4 MG PO TABS: 4.0000 mg | ORAL_TABLET | Freq: Three times a day (TID) | ORAL | 0 refills | Status: DC | PRN

## 2023-07-28 MED ORDER — CLINDAMYCIN HCL 150 MG PO CAPS
150.0000 mg | ORAL_CAPSULE | Freq: Three times a day (TID) | ORAL | 0 refills | Status: DC
Start: 2023-07-28 — End: 2023-10-28

## 2023-07-28 MED ORDER — OXYCODONE-ACETAMINOPHEN 10-325 MG PO TABS: 1.0000 | ORAL_TABLET | Freq: Three times a day (TID) | ORAL | 0 refills | Status: AC | PRN

## 2023-07-30 DIAGNOSIS — M25774 Osteophyte, right foot: Secondary | ICD-10-CM | POA: Diagnosis not present

## 2023-07-30 DIAGNOSIS — M899 Disorder of bone, unspecified: Secondary | ICD-10-CM | POA: Diagnosis not present

## 2023-07-30 DIAGNOSIS — M85871 Other specified disorders of bone density and structure, right ankle and foot: Secondary | ICD-10-CM | POA: Diagnosis not present

## 2023-08-04 ENCOUNTER — Ambulatory Visit (INDEPENDENT_AMBULATORY_CARE_PROVIDER_SITE_OTHER): Admitting: Podiatry

## 2023-08-04 ENCOUNTER — Ambulatory Visit (INDEPENDENT_AMBULATORY_CARE_PROVIDER_SITE_OTHER)

## 2023-08-04 ENCOUNTER — Encounter: Payer: Self-pay | Admitting: Podiatry

## 2023-08-04 VITALS — Ht 62.0 in | Wt 166.0 lb

## 2023-08-04 DIAGNOSIS — M898X7 Other specified disorders of bone, ankle and foot: Secondary | ICD-10-CM

## 2023-08-04 DIAGNOSIS — D2371 Other benign neoplasm of skin of right lower limb, including hip: Secondary | ICD-10-CM | POA: Diagnosis not present

## 2023-08-04 NOTE — Progress Notes (Signed)
  Subjective:  Patient ID: Gina Harper, female    DOB: [birth year],  MRN: [id number]  Chief Complaint  Patient presents with   Routine Post Op    Pt is here for 1st post op visit since surgery on 4/11 to left foot,  pt states she is having some pain and that pain meds made her sick so she is not taking it, no other complaints. Area very tender and hurts when taking bandages off.    DOS: 07/30/2023 Procedure: Ostectomy right 1st and 2nd toe  73 y.o. female returns for post-op check.  Overall doing well  Review of Systems: Negative except as noted in the HPI. Denies N/V/F/Ch.   Objective:  There were no vitals filed for this visit. Body mass index is 30.36 kg/m. Constitutional Well developed. Well nourished.  Vascular Foot warm and well perfused. Capillary refill normal to all digits.  Calf is soft and supple, no posterior calf or knee pain, negative Homans' sign  Neurologic Normal speech. Oriented to person, place, and time. Epicritic sensation to light touch grossly present bilaterally.  Dermatologic Skin healing well without signs of infection. Skin edges well coapted without signs of infection.  Orthopedic: Tenderness to palpation noted about the surgical site.   Multiple view plain film radiographs: Interval resection of portions of the medial 2nd and 1st lateral distal and proximal phalanx Assessment:   1. Benign neoplasm of skin of right foot    Plan:  Patient was evaluated and treated and all questions answered.  S/p foot surgery right -Progressing as expected post-operatively.  Dressing was slightly stuck on but was removed after soaking with saline with some pain.  Local anesthetic not required.  Redressed with Xeroform nonadherent and gauze.  She will leave in place for 1 week and then may remove and shower.  Return in 2 weeks for suture removal.  Continue surgical shoe for ambulation may remove when sleeping or resting.   No follow-ups on file.

## 2023-08-05 ENCOUNTER — Encounter: Admitting: Podiatry

## 2023-08-05 DIAGNOSIS — H029 Unspecified disorder of eyelid: Secondary | ICD-10-CM | POA: Diagnosis not present

## 2023-08-18 ENCOUNTER — Ambulatory Visit (INDEPENDENT_AMBULATORY_CARE_PROVIDER_SITE_OTHER): Admitting: Podiatry

## 2023-08-18 ENCOUNTER — Encounter: Payer: Self-pay | Admitting: Podiatry

## 2023-08-18 DIAGNOSIS — Z9889 Other specified postprocedural states: Secondary | ICD-10-CM

## 2023-08-18 DIAGNOSIS — M898X7 Other specified disorders of bone, ankle and foot: Secondary | ICD-10-CM

## 2023-08-18 NOTE — Progress Notes (Signed)
 She presents for her second postop visit date of surgery 07/30/2023 exostectomy lateral aspect of the hallux interphalangeal joint medial aspect of the PIPJ second digit right.  States that has been doing fine though there is an area that gives me a very sharp pain when touched.  She points to the lateral aspect of the hallux.  Objective: Toe is mildly edematous it is tender with radiating pain in the proximal incision line.  Otherwise the sutures appear to be healing very nicely.  There is no signs of infection.  Assessment: Well-healing surgical toes hallux and second digit right.  Plan: Remove sutures today.  Demonstrated to her how to utilize compression dressing.  I will follow-up with her in about 2 weeks or so.  She can get back into regular shoes is much as possible.

## 2023-08-19 ENCOUNTER — Encounter: Admitting: Podiatry

## 2023-08-20 DIAGNOSIS — G4733 Obstructive sleep apnea (adult) (pediatric): Secondary | ICD-10-CM | POA: Diagnosis not present

## 2023-08-31 ENCOUNTER — Telehealth: Payer: Self-pay | Admitting: Pulmonary Disease

## 2023-08-31 NOTE — Telephone Encounter (Signed)
 Rc'd from [employer] a CMN for CPAP supplies. Sending to Dr. Deanna Expose for signature.

## 2023-09-02 DIAGNOSIS — L570 Actinic keratosis: Secondary | ICD-10-CM | POA: Diagnosis not present

## 2023-09-08 ENCOUNTER — Encounter: Payer: Self-pay | Admitting: Podiatry

## 2023-09-08 ENCOUNTER — Ambulatory Visit (INDEPENDENT_AMBULATORY_CARE_PROVIDER_SITE_OTHER): Admitting: Podiatry

## 2023-09-08 DIAGNOSIS — M898X7 Other specified disorders of bone, ankle and foot: Secondary | ICD-10-CM

## 2023-09-08 NOTE — Progress Notes (Signed)
 She presents today for follow-up of her excision spurs lateral hallux right medial second digit right.  She states that her doing is great no pain at all.  Objective: Vital signs are stable alert and oriented x 3.  There is no erythema edema cellulitis drainage or odor.  Appears to be healing very well.  Assessment: Well-healing surgical toe.  Plan: Follow-up with me on as-needed basis

## 2023-09-09 ENCOUNTER — Encounter: Admitting: Podiatry

## 2023-09-09 DIAGNOSIS — D2339 Other benign neoplasm of skin of other parts of face: Secondary | ICD-10-CM | POA: Diagnosis not present

## 2023-09-14 ENCOUNTER — Telehealth: Payer: Self-pay | Admitting: Pulmonary Disease

## 2023-09-14 NOTE — Telephone Encounter (Signed)
 Order went through [employer], faxing back to [place] to contact [employer].

## 2023-09-14 NOTE — Telephone Encounter (Signed)
 Cmn received from [facility] for CPAP supplies.

## 2023-09-16 ENCOUNTER — Telehealth: Payer: Self-pay | Admitting: Pulmonary Disease

## 2023-09-16 NOTE — Telephone Encounter (Signed)
 Rc'd second CMN signed by Dr. Deanna Expose.

## 2023-10-26 ENCOUNTER — Other Ambulatory Visit: Payer: Self-pay | Admitting: Family Medicine

## 2023-10-26 DIAGNOSIS — Z85820 Personal history of malignant melanoma of skin: Secondary | ICD-10-CM | POA: Diagnosis not present

## 2023-10-26 DIAGNOSIS — Z872 Personal history of diseases of the skin and subcutaneous tissue: Secondary | ICD-10-CM | POA: Diagnosis not present

## 2023-10-26 DIAGNOSIS — L570 Actinic keratosis: Secondary | ICD-10-CM | POA: Diagnosis not present

## 2023-10-26 DIAGNOSIS — Z1231 Encounter for screening mammogram for malignant neoplasm of breast: Secondary | ICD-10-CM

## 2023-10-26 DIAGNOSIS — Z86018 Personal history of other benign neoplasm: Secondary | ICD-10-CM | POA: Diagnosis not present

## 2023-10-26 DIAGNOSIS — L578 Other skin changes due to chronic exposure to nonionizing radiation: Secondary | ICD-10-CM | POA: Diagnosis not present

## 2023-10-26 DIAGNOSIS — D492 Neoplasm of unspecified behavior of bone, soft tissue, and skin: Secondary | ICD-10-CM | POA: Diagnosis not present

## 2023-10-28 ENCOUNTER — Ambulatory Visit (INDEPENDENT_AMBULATORY_CARE_PROVIDER_SITE_OTHER): Admitting: Family Medicine

## 2023-10-28 ENCOUNTER — Encounter: Payer: Self-pay | Admitting: Family Medicine

## 2023-10-28 VITALS — BP 110/70 | HR 72 | Temp 97.8°F | Ht 62.0 in | Wt 168.4 lb

## 2023-10-28 DIAGNOSIS — J4531 Mild persistent asthma with (acute) exacerbation: Secondary | ICD-10-CM | POA: Diagnosis not present

## 2023-10-28 DIAGNOSIS — J45909 Unspecified asthma, uncomplicated: Secondary | ICD-10-CM | POA: Insufficient documentation

## 2023-10-28 MED ORDER — ALBUTEROL SULFATE HFA 108 (90 BASE) MCG/ACT IN AERS: 2.0000 | INHALATION_SPRAY | Freq: Four times a day (QID) | RESPIRATORY_TRACT | 2 refills | Status: AC | PRN

## 2023-10-28 MED ORDER — FLUTICASONE-SALMETEROL 100-50 MCG/ACT IN AEPB: 1.0000 | INHALATION_SPRAY | Freq: Two times a day (BID) | RESPIRATORY_TRACT | 11 refills | Status: DC

## 2023-10-28 MED ORDER — DOXYCYCLINE HYCLATE 100 MG PO TABS: 100.0000 mg | ORAL_TABLET | Freq: Two times a day (BID) | ORAL | 0 refills | Status: DC

## 2023-10-28 MED ORDER — PREDNISONE 20 MG PO TABS: ORAL_TABLET | ORAL | 0 refills | Status: DC

## 2023-10-28 NOTE — Assessment & Plan Note (Signed)
 Acute, ongoing greater than 7 to 10 days with associated asthma exacerbation.  Most likely initial viral infection now with concern for bacterial superinfection given change in color of sputum. Recommend treating with doxycycline  100 mg twice daily (this was chosen given allergy profile) Continue prednisone  taper over 7 days, 60 mg/40 mg / 20 mg. Given prescription for albuterol  to use for rescue as needed.  Given 2 episodes in the last 6 months and previous pulmonary recommendations will send in prescription for [facility] given she could not afford Symbicort . Also of note she could not afford Airsupra.

## 2023-10-28 NOTE — Progress Notes (Signed)
 Patient ID: [name], female    DOB: 25-May-1950, 73 y.o.   MRN: [id number]  This visit was conducted in person.  BP 110/70   Pulse 72   Temp 97.8 F (36.6 C) (Temporal)   Ht 5' 2 (1.575 m)   Wt 168 lb 6 oz (76.4 kg)   SpO2 96%   BMI 30.80 kg/m    CC:  Chief Complaint  Patient presents with   Cough    Productive with yellow phlegm x 1 week   Generalized Body Aches        Wheezing    Subjective:   HPI: Gina Harper is a 73 y.o. female presenting on 10/28/2023 for Cough (Productive with yellow phlegm x 1 week), Generalized Body Aches (/), and Wheezing   Date of onset:  1 week post nasal drip Initial symptoms included  Symptoms progressed to productive cough, yellow phlegm/ color change Wheezing, off and on.  SOB with coughing fits.  No ear pain, no face pain  Mild ST.  No fever.   Feeling fatigued.  Sick contacts:  husband COVID testing:   none     She has tried to treat with nothing   Out   of  albuterol    On Symbicort  80/4.5  2 puffs BID.SABRA does not have this right now .SABRA Thinks it was to pricy.  She is on zyrtec for allergies.     History: exercise induced asthma Former smoker      Relevant past medical, surgical, family and social history reviewed and updated as indicated. Interim medical history since our last visit reviewed. Allergies and medications reviewed and updated. Outpatient Medications Prior to Visit  Medication Sig Dispense Refill   ALPHA LIPOIC ACID PO Take 1,200 mg by mouth daily.     Biotin 89999 MCG TABS Take 1 tablet by mouth daily.     budesonide -formoterol  (SYMBICORT ) 80-4.5 MCG/ACT inhaler Inhale 2 puffs into the lungs every 12 (twelve) hours as needed. 1 each 5   cetirizine (ZYRTEC) 10 MG tablet Take 10 mg by mouth as needed.      Cholecalciferol (VITAMIN D3) 2000 UNITS TABS Take by mouth daily.      FLUoxetine  (PROZAC ) 10 MG capsule TAKE 1 CAPSULE BY MOUTH DAILY 100 capsule 1   hydrochlorothiazide  (HYDRODIURIL ) 25 MG  tablet TAKE 1 TABLET BY MOUTH DAILY 100 tablet 2   magnesium oxide (MAG-OX) 400 (240 Mg) MG tablet Take 400 mg by mouth daily.     Multiple Vitamin (MULTI-VITAMINS) TABS Take by mouth daily.     ondansetron  (ZOFRAN ) 4 MG tablet Take 1 tablet (4 mg total) by mouth every 8 (eight) hours as needed. 20 tablet 0   Potassium Gluconate 550 MG TABS Take by mouth daily.      promethazine  (PHENERGAN ) 25 MG suppository Place 1 suppository (25 mg total) rectally every 6 (six) hours as needed. 12 each 1   vitamin B-12 (CYANOCOBALAMIN ) 1000 MCG tablet Take 1,000 mcg by mouth daily.     albuterol  (VENTOLIN  HFA) 108 (90 Base) MCG/ACT inhaler Inhale 2 puffs into the lungs every 6 (six) hours as needed for wheezing or shortness of breath. 8 g 2   clindamycin  (CLEOCIN ) 150 MG capsule Take 1 capsule (150 mg total) by mouth 3 (three) times daily. 30 capsule 0   docusate sodium (COLACE) 100 MG capsule Take 100 mg by mouth daily.     Magnesium Gluconate 500 (27 Mg) MG TABS Take 1 tablet by mouth  daily.     atorvastatin  (LIPITOR) 10 MG tablet TAKE 1 TABLET BY MOUTH DAILY (Patient not taking: Reported on 10/28/2023) 100 tablet 2   No facility-administered medications prior to visit.     Per HPI unless specifically indicated in ROS section below Review of Systems  Constitutional:  Negative for fatigue and fever.  HENT:  Negative for congestion.   Eyes:  Negative for pain.  Respiratory:  Positive for cough, shortness of breath and wheezing.   Cardiovascular:  Negative for chest pain, palpitations and leg swelling.  Gastrointestinal:  Negative for abdominal pain.  Genitourinary:  Negative for dysuria and vaginal bleeding.  Musculoskeletal:  Negative for back pain.  Neurological:  Negative for syncope, light-headedness and headaches.  Psychiatric/Behavioral:  Negative for dysphoric mood.    Objective:  BP 110/70   Pulse 72   Temp 97.8 F (36.6 C) (Temporal)   Ht 5' 2 (1.575 m)   Wt 168 lb 6 oz (76.4 kg)    SpO2 96%   BMI 30.80 kg/m   Wt Readings from Last 3 Encounters:  10/28/23 168 lb 6 oz (76.4 kg)  08/04/23 166 lb (75.3 kg)  07/20/23 166 lb (75.3 kg)      Physical Exam Constitutional:      General: She is not in acute distress.    Appearance: She is well-developed. She is not ill-appearing or toxic-appearing.  HENT:     Head: Normocephalic.     Right Ear: Hearing, tympanic membrane, ear canal and external ear normal. Tympanic membrane is not erythematous, retracted or bulging.     Left Ear: Hearing, tympanic membrane, ear canal and external ear normal. Tympanic membrane is not erythematous, retracted or bulging.     Nose: Mucosal edema and rhinorrhea present.     Right Sinus: No maxillary sinus tenderness or frontal sinus tenderness.     Left Sinus: No maxillary sinus tenderness or frontal sinus tenderness.     Mouth/Throat:     Pharynx: Uvula midline.  Eyes:     General: Lids are normal. Lids are everted, no foreign bodies appreciated.     Conjunctiva/sclera: Conjunctivae normal.     Pupils: Pupils are equal, round, and reactive to light.  Neck:     Thyroid : No thyroid  mass or thyromegaly.     Vascular: No carotid bruit.     Trachea: Trachea normal.  Cardiovascular:     Rate and Rhythm: Normal rate and regular rhythm.     Pulses: Normal pulses.     Heart sounds: Normal heart sounds, S1 normal and S2 normal. No murmur heard.    No friction rub. No gallop.  Pulmonary:     Effort: Pulmonary effort is normal. No tachypnea or respiratory distress.     Breath sounds: Wheezing present. No decreased breath sounds, rhonchi or rales.  Musculoskeletal:     Cervical back: Normal range of motion and neck supple.  Skin:    General: Skin is warm and dry.     Findings: No rash.  Neurological:     Mental Status: She is alert.  Psychiatric:        Mood and Affect: Mood is not anxious or depressed.        Speech: Speech normal.        Behavior: Behavior normal. Behavior is cooperative.         Judgment: Judgment normal.       Results for orders placed or performed in visit on 04/29/23  Vitamin B12  Collection Time: 04/29/23  9:00 AM  Result Value Ref Range   Vitamin B-12 845 211 - 911 pg/mL  Comprehensive metabolic panel   Collection Time: 04/29/23  9:00 AM  Result Value Ref Range   Sodium 142 135 - 145 mEq/L   Potassium 3.8 3.5 - 5.1 mEq/L   Chloride 101 96 - 112 mEq/L   CO2 32 19 - 32 mEq/L   Glucose, Bld 99 70 - 99 mg/dL   BUN 15 6 - 23 mg/dL   Creatinine, Ser 9.29 0.40 - 1.20 mg/dL   Total Bilirubin 0.6 0.2 - 1.2 mg/dL   Alkaline Phosphatase 85 39 - 117 U/L   AST 18 0 - 37 U/L   ALT 17 0 - 35 U/L   Total Protein 7.3 6.0 - 8.3 g/dL   Albumin 4.7 3.5 - 5.2 g/dL   GFR 14.13 >39.99 mL/min   Calcium  9.7 8.4 - 10.5 mg/dL  Hemoglobin J8r   Collection Time: 04/29/23  9:00 AM  Result Value Ref Range   Hgb A1c MFr Bld 5.6 4.6 - 6.5 %  Lipid panel   Collection Time: 04/29/23  9:00 AM  Result Value Ref Range   Cholesterol 127 0 - 200 mg/dL   Triglycerides 00.9 0.0 - 149.0 mg/dL   HDL 52.59 >60.99 mg/dL   VLDL 80.1 0.0 - 59.9 mg/dL   LDL Cholesterol 60 0 - 99 mg/dL   Total CHOL/HDL Ratio 3    NonHDL 79.63     Assessment and Plan  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  Most likely initial viral infection now with concern for bacterial superinfection given change in color of sputum. Recommend treating with doxycycline  100 mg twice daily (this was chosen given allergy profile) Continue prednisone  taper over 7 days, 60 mg/40 mg / 20 mg. Given prescription for albuterol  to use for rescue as needed.  Given 2 episodes in the last 6 months and previous pulmonary recommendations will send in prescription for [facility] given she could not afford Symbicort . Also of note she could not afford Airsupra.   Orders: -     Albuterol  Sulfate HFA; Inhale  2 puffs into the lungs every 6 (six) hours as needed for wheezing or shortness of breath.  Dispense: 8 g; Refill: 2  Other orders -     Doxycycline  Hyclate; Take 1 tablet (100 mg total) by mouth 2 (two) times daily.  Dispense: 20 tablet; Refill: 0 -     predniSONE ; 3 tabs by mouth daily x 3 days, then 2 tabs by mouth daily x 2 days then 1 tab by mouth daily x 2 days  Dispense: 15 tablet; Refill: 0 -     Fluticasone -Salmeterol; Inhale 1 puff into the lungs 2 (two) times daily.  Dispense: 1 each; Refill: 11    Return if symptoms worsen or fail to improve.   Greig Ring, MD

## 2023-12-02 ENCOUNTER — Other Ambulatory Visit: Payer: Self-pay | Admitting: Family Medicine

## 2023-12-07 ENCOUNTER — Ambulatory Visit (INDEPENDENT_AMBULATORY_CARE_PROVIDER_SITE_OTHER): Admitting: Family Medicine

## 2023-12-07 ENCOUNTER — Encounter: Payer: Self-pay | Admitting: Family Medicine

## 2023-12-07 VITALS — BP 130/70 | HR 72 | Temp 98.3°F | Ht 62.0 in | Wt 171.5 lb

## 2023-12-07 DIAGNOSIS — N3000 Acute cystitis without hematuria: Secondary | ICD-10-CM

## 2023-12-07 DIAGNOSIS — R3915 Urgency of urination: Secondary | ICD-10-CM

## 2023-12-07 LAB — POC URINALSYSI DIPSTICK (AUTOMATED)
Bilirubin, UA: NEGATIVE
Blood, UA: NEGATIVE
Glucose, UA: NEGATIVE
Ketones, UA: NEGATIVE
Nitrite, UA: NEGATIVE
Protein, UA: NEGATIVE
Spec Grav, UA: 1.01 (ref 1.010–1.025)
Urobilinogen, UA: 0.2 U/dL
pH, UA: 6 (ref 5.0–8.0)

## 2023-12-07 MED ORDER — SULFAMETHOXAZOLE-TRIMETHOPRIM 800-160 MG PO TABS
1.0000 | ORAL_TABLET | Freq: Two times a day (BID) | ORAL | 0 refills | Status: DC
Start: 2023-12-07 — End: 2024-02-07

## 2023-12-07 NOTE — Assessment & Plan Note (Signed)
 Acute, symptoms characteristic of urinary tract infection.  White blood cells seen in urinalysis.  Not enough urine sample to evaluate microscopically. Will send for culture. Encourage patient to avoid acidic foods/bladder irritants.  Push water.  Will treat with Bactrim  double strength 1 tablet twice daily x 3 days.  Return and ER precautions provided.

## 2023-12-07 NOTE — Progress Notes (Signed)
 Patient ID: Gina Harper, female    DOB: Jun 01, 1950, 73 y.o.   MRN: [id number]  This visit was conducted in person.  BP 130/70   Pulse 72   Temp 98.3 F (36.8 C) (Temporal)   Ht 5' 2 (1.575 m)   Wt 171 lb 8 oz (77.8 kg)   SpO2 96%   BMI 31.37 kg/m    CC:  Chief Complaint  Patient presents with   Urinary Urgency    Subjective:   HPI: Gina Harper is a 73 y.o. female presenting on 12/07/2023 for Urinary Urgency      Dysuria  This is a new problem. The current episode started in the past 7 days. The problem has been waxing and waning. The quality of the pain is described as burning. The pain is moderate. There has been no fever. She is Not sexually active. Associated symptoms include urgency. Pertinent negatives include no chills, discharge, flank pain, frequency, hematuria, hesitancy, nausea, sweats or vomiting. Associated symptoms comments:  Cloudy urine. Treatments tried: OTC pyridium. The treatment provided moderate relief. There is no history of catheterization or a urological procedure.     Relevant past medical, surgical, family and social history reviewed and updated as indicated. Interim medical history since our last visit reviewed. Allergies and medications reviewed and updated. Outpatient Medications Prior to Visit  Medication Sig Dispense Refill   albuterol  (VENTOLIN  HFA) 108 (90 Base) MCG/ACT inhaler Inhale 2 puffs into the lungs every 6 (six) hours as needed for wheezing or shortness of breath. 8 g 2   ALPHA LIPOIC ACID PO Take 1,200 mg by mouth daily.     Biotin 89999 MCG TABS Take 1 tablet by mouth daily.     budesonide -formoterol  (SYMBICORT ) 80-4.5 MCG/ACT inhaler Inhale 2 puffs into the lungs every 12 (twelve) hours as needed. 1 each 5   cetirizine (ZYRTEC) 10 MG tablet Take 10 mg by mouth as needed.      Cholecalciferol (VITAMIN D3) 2000 UNITS TABS Take by mouth daily.      FLUoxetine  (PROZAC ) 10 MG capsule TAKE 1 CAPSULE BY MOUTH DAILY 100 capsule 1    fluticasone -salmeterol (WIXELA INHUB) 100-50 MCG/ACT AEPB Inhale 1 puff into the lungs 2 (two) times daily. 1 each 11   hydrochlorothiazide  (HYDRODIURIL ) 25 MG tablet TAKE 1 TABLET BY MOUTH DAILY 100 tablet 2   magnesium oxide (MAG-OX) 400 (240 Mg) MG tablet Take 400 mg by mouth daily.     Multiple Vitamin (MULTI-VITAMINS) TABS Take by mouth daily.     ondansetron  (ZOFRAN ) 4 MG tablet Take 1 tablet (4 mg total) by mouth every 8 (eight) hours as needed. 20 tablet 0   Potassium Gluconate 550 MG TABS Take by mouth daily.      promethazine  (PHENERGAN ) 25 MG suppository Place 1 suppository (25 mg total) rectally every 6 (six) hours as needed. 12 each 1   vitamin B-12 (CYANOCOBALAMIN ) 1000 MCG tablet Take 1,000 mcg by mouth daily.     atorvastatin  (LIPITOR) 10 MG tablet TAKE 1 TABLET BY MOUTH DAILY (Patient not taking: Reported on 10/28/2023) 100 tablet 2   doxycycline  (VIBRA -TABS) 100 MG tablet Take 1 tablet (100 mg total) by mouth 2 (two) times daily. 20 tablet 0   predniSONE  (DELTASONE ) 20 MG tablet 3 tabs by mouth daily x 3 days, then 2 tabs by mouth daily x 2 days then 1 tab by mouth daily x 2 days 15 tablet 0   No facility-administered medications prior to visit.  Per HPI unless specifically indicated in ROS section below Review of Systems  Constitutional:  Negative for chills.  Gastrointestinal:  Negative for nausea and vomiting.  Genitourinary:  Positive for dysuria and urgency. Negative for flank pain, frequency, hematuria and hesitancy.   Objective:  BP 130/70   Pulse 72   Temp 98.3 F (36.8 C) (Temporal)   Ht 5' 2 (1.575 m)   Wt 171 lb 8 oz (77.8 kg)   SpO2 96%   BMI 31.37 kg/m   Wt Readings from Last 3 Encounters:  12/07/23 171 lb 8 oz (77.8 kg)  10/28/23 168 lb 6 oz (76.4 kg)  08/04/23 166 lb (75.3 kg)      Physical Exam Constitutional:      General: She is not in acute distress.    Appearance: Normal appearance. She is well-developed. She is not ill-appearing or  toxic-appearing.  HENT:     Head: Normocephalic.     Right Ear: Hearing, tympanic membrane, ear canal and external ear normal. Tympanic membrane is not erythematous, retracted or bulging.     Left Ear: Hearing, tympanic membrane, ear canal and external ear normal. Tympanic membrane is not erythematous, retracted or bulging.     Nose: No mucosal edema or rhinorrhea.     Right Sinus: No maxillary sinus tenderness or frontal sinus tenderness.     Left Sinus: No maxillary sinus tenderness or frontal sinus tenderness.     Mouth/Throat:     Pharynx: Uvula midline.  Eyes:     General: Lids are normal. Lids are everted, no foreign bodies appreciated.     Conjunctiva/sclera: Conjunctivae normal.     Pupils: Pupils are equal, round, and reactive to light.  Neck:     Thyroid : No thyroid  mass or thyromegaly.     Vascular: No carotid bruit.     Trachea: Trachea normal.  Cardiovascular:     Rate and Rhythm: Normal rate and regular rhythm.     Pulses: Normal pulses.     Heart sounds: Normal heart sounds, S1 normal and S2 normal. No murmur heard.    No friction rub. No gallop.  Pulmonary:     Effort: Pulmonary effort is normal. No tachypnea or respiratory distress.     Breath sounds: Normal breath sounds. No decreased breath sounds, wheezing, rhonchi or rales.  Abdominal:     General: Bowel sounds are normal.     Palpations: Abdomen is soft.     Tenderness: There is no abdominal tenderness.  Musculoskeletal:     Cervical back: Normal range of motion and neck supple.  Skin:    General: Skin is warm and dry.     Findings: No rash.  Neurological:     Mental Status: She is alert.  Psychiatric:        Mood and Affect: Mood is not anxious or depressed.        Speech: Speech normal.        Behavior: Behavior normal. Behavior is cooperative.        Thought Content: Thought content normal.        Judgment: Judgment normal.       Results for orders placed or performed in visit on 12/07/23  POCT  Urinalysis Dipstick (Automated)   Collection Time: 12/07/23  8:59 AM  Result Value Ref Range   Color, UA Yellow    Clarity, UA Clear    Glucose, UA Negative Negative   Bilirubin, UA Negative    Ketones, UA Negative  Spec Grav, UA 1.010 1.010 - 1.025   Blood, UA Negative    pH, UA 6.0 5.0 - 8.0   Protein, UA Negative Negative   Urobilinogen, UA 0.2 0.2 or 1.0 E.U./dL   Nitrite, UA Negative    Leukocytes, UA Small (1+) (A) Negative    Assessment and Plan   Urinary urgency -     POCT Urinalysis Dipstick (Automated) -     Urine Culture  Acute cystitis without hematuria    No follow-ups on file.   Greig Ring, MD

## 2023-12-08 ENCOUNTER — Ambulatory Visit (INDEPENDENT_AMBULATORY_CARE_PROVIDER_SITE_OTHER): Admitting: Pulmonary Disease

## 2023-12-08 VITALS — BP 121/76 | HR 63 | Ht 62.0 in | Wt 172.0 lb

## 2023-12-08 DIAGNOSIS — J45909 Unspecified asthma, uncomplicated: Secondary | ICD-10-CM | POA: Diagnosis not present

## 2023-12-08 DIAGNOSIS — J4531 Mild persistent asthma with (acute) exacerbation: Secondary | ICD-10-CM

## 2023-12-08 DIAGNOSIS — Z87891 Personal history of nicotine dependence: Secondary | ICD-10-CM

## 2023-12-08 DIAGNOSIS — R0683 Snoring: Secondary | ICD-10-CM

## 2023-12-08 DIAGNOSIS — J019 Acute sinusitis, unspecified: Secondary | ICD-10-CM

## 2023-12-08 DIAGNOSIS — G4733 Obstructive sleep apnea (adult) (pediatric): Secondary | ICD-10-CM | POA: Diagnosis not present

## 2023-12-08 DIAGNOSIS — R059 Cough, unspecified: Secondary | ICD-10-CM

## 2023-12-08 DIAGNOSIS — G4719 Other hypersomnia: Secondary | ICD-10-CM

## 2023-12-08 DIAGNOSIS — E66811 Obesity, class 1: Secondary | ICD-10-CM

## 2023-12-08 NOTE — Patient Instructions (Signed)
 DME referral for mask fitting  Trial with melatonin 3 mg, you can go up to 5 mg on 10 mg as tolerated  Continue Wixela  Graded activities as tolerated  Follow-up in 6 months

## 2023-12-08 NOTE — Progress Notes (Unsigned)
 Gina Harper    [id number]    07-20-1950  Primary Care Physician:Bedsole, Greig BRAVO, MD  Referring Physician: Avelina Greig BRAVO, MD [address] [place],  [place] [address]  Chief complaint:   Patient being seen for obstructive sleep apnea Wakes up sometimes with mask of her face  HPI:  Diagnosed with severe obstructive sleep apnea Has been trying to get used to using CPAP regularly  Pressures were reduced during the last visit and she seemed to be tolerating it better She does try to use CPAP nightly Recent respiratory tract infection during which she was not able to use her CPAP regularly  She does wake up sometimes with mask off her face  Did talk about mask issues, pressure tolerance  She does notice that she feels better when she is able to leave the mask on  History of asthma -Uses albuterol  as needed  Reformed smoker, 34-pack-year smoking history  Outpatient Encounter Medications as of 12/08/2023  Medication Sig   albuterol  (VENTOLIN  HFA) 108 (90 Base) MCG/ACT inhaler Inhale 2 puffs into the lungs every 6 (six) hours as needed for wheezing or shortness of breath.   ALPHA LIPOIC ACID PO Take 1,200 mg by mouth daily.   Biotin 89999 MCG TABS Take 1 tablet by mouth daily.   budesonide -formoterol  (SYMBICORT ) 80-4.5 MCG/ACT inhaler Inhale 2 puffs into the lungs every 12 (twelve) hours as needed.   cetirizine (ZYRTEC) 10 MG tablet Take 10 mg by mouth as needed.    Cholecalciferol (VITAMIN D3) 2000 UNITS TABS Take by mouth daily.    FLUoxetine  (PROZAC ) 10 MG capsule TAKE 1 CAPSULE BY MOUTH DAILY   fluticasone -salmeterol (WIXELA INHUB) 100-50 MCG/ACT AEPB Inhale 1 puff into the lungs 2 (two) times daily.   hydrochlorothiazide  (HYDRODIURIL ) 25 MG tablet TAKE 1 TABLET BY MOUTH DAILY   magnesium oxide (MAG-OX) 400 (240 Mg) MG tablet Take 400 mg by mouth daily.   Multiple Vitamin (MULTI-VITAMINS) TABS Take by mouth daily.   ondansetron  (ZOFRAN ) 4 MG tablet Take 1  tablet (4 mg total) by mouth every 8 (eight) hours as needed.   Potassium Gluconate 550 MG TABS Take by mouth daily.    promethazine  (PHENERGAN ) 25 MG suppository Place 1 suppository (25 mg total) rectally every 6 (six) hours as needed.   sulfamethoxazole -trimethoprim  (BACTRIM  DS) 800-160 MG tablet Take 1 tablet by mouth 2 (two) times daily.   vitamin B-12 (CYANOCOBALAMIN ) 1000 MCG tablet Take 1,000 mcg by mouth daily.   No facility-administered encounter medications on file as of 12/08/2023.    Allergies as of 12/08/2023 - Review Complete 12/08/2023  Allergen Reaction Noted   Cefdinir Hives 07/04/2014   Lisinopril  Cough and Other (See Comments) 01/31/2014   Meloxicam Swelling 08/15/2012   Omnipen [ampicillin] Other (See Comments) 08/15/2012   Augmentin [amoxicillin-pot clavulanate] Rash 02/08/2012   Clarithromycin Rash 07/04/2014    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HCC)    rectal spasm,syncope, nausea, ongoing for years   Allergic rhinitis, cause unspecified    Allergy    Alopecia, unspecified    Anemia    pernicious anemia   Arthritis    hands, knees, hips   Asthma    Cancer (HCC)    melanoma   Chicken pox    Depressive disorder, not elsewhere classified    Disorder of bone and cartilage, unspecified    Esophageal reflux    GERD (gastroesophageal reflux disease)  Hepatitis    History of shingles    Insomnia, unspecified    Measles    red measles   Motion sickness    Mumps    Other abnormal glucose    Palpitations    Syncope, vasovagal    Tobacco use disorder    Unspecified asthma(493.90)    exercise induced. Never hospitalized   Unspecified essential hypertension    Unspecified hearing loss    Unspecified pruritic disorder    Wears hearing aid in both ears     Past Surgical History:  Procedure Laterality Date   bladder tact  1995   BROW LIFT Bilateral 07/09/2015   Procedure: BLEPHAROPLASTY;  Surgeon: Greig CHRISTELLA Gay, MD;   Location: [facility];  Service: Ophthalmology;  Laterality: Bilateral;   BUNIONECTOMY  [id number]   bilateral   CATARACT EXTRACTION W/PHACO Left 08/05/2022   Procedure: CATARACT EXTRACTION PHACO AND INTRAOCULAR LENS PLACEMENT (IOC) LEFT  8.47  01:06.2;  Surgeon: Mittie Gaskin, MD;  Location: [facility];  Service: Ophthalmology;  Laterality: Left;   CATARACT EXTRACTION W/PHACO Right 08/19/2022   Procedure: CATARACT EXTRACTION PHACO AND INTRAOCULAR LENS PLACEMENT (IOC) RIGHT CLAREON  TORIC  4.43  00:33.2;  Surgeon: Mittie Gaskin, MD;  Location: [facility];  Service: Ophthalmology;  Laterality: Right;   colonoscopy  04/20/2004   no polyps; normal.  Repeat in 10 years.  [place].   COLONOSCOPY WITH PROPOFOL  N/A 05/20/2015   Procedure: COLONOSCOPY WITH PROPOFOL ;  Surgeon: Gladis RAYMOND Mariner, MD;  Location: [facility];  Service: Endoscopy;  Laterality: N/A;   COLONOSCOPY WITH PROPOFOL  N/A 09/25/2020   Procedure: COLONOSCOPY WITH PROPOFOL ;  Surgeon: Toledo, Ladell POUR, MD;  Location: [facility] END[facility];  Service: Gastroenterology;  Laterality: N/A;   COSMETIC SURGERY     rhinoplasty   ESOPHAGOGASTRODUODENOSCOPY  03/30/2005   PTOSIS REPAIR Bilateral 07/09/2015   Procedure: PTOSIS REPAIR;  Surgeon: Greig CHRISTELLA Gay, MD;  Location: [facility];  Service: Ophthalmology;  Laterality: Bilateral;   rhinoplasty with septoplasty     TONSILLECTOMY AND ADENOIDECTOMY     TUBAL LIGATION     VAGINAL HYSTERECTOMY  1997   menorrhagia ovaries intact    Family History  Problem Relation Age of Onset   Cancer Mother        Lung, Bladder   Stroke Father 3       TIAs   Heart disease Father 75       AMI x 2.   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  Tobacco Use   Smoking status: Former    Current packs/day: 0.00    Average packs/day: 2.0 packs/day for 17.0 years (34.0 ttl pk-yrs)    Types: Cigarettes    Start date: 05/23/1971    Quit date: 05/22/1988    Years since quitting: 35.5   Smokeless tobacco: Never   Tobacco comments:    Quit 22 years ago, 1990  Vaping Use   Vaping status: Never Used  Substance and Sexual Activity   Alcohol use: Yes    Alcohol/week: 6.0 standard drinks of alcohol    Types: 6 Standard drinks or equivalent per week    Comment: moderate white wine 5 glasses per week   Drug use: No   Sexual activity: Yes    Partners: Male    Birth control/protection: Post-menopausal, Surgical  Other Topics Concern   Not on file  Social History Narrative      Marital status:  Married x 40 years, happily married; no domestic abuse.      Children:  1 child; 3 grandchildren local.      Employment:  Retired in 09/2012; [employer] x 21 years; happy.  Pre-admission testing.      Tobacco:  Previous smoker. Quit 1990      Alcohol:  2 glasses of wine three nights per week.      Drugs; none      Exercise:  Treadmill, elliptical, Zumba.  [employer] 3x per week.      Seatbelt:  Always uses seat belts.       +Smoke alarm and carbon monoxide detector in the home.       Guns:  Guns stored in locked cabinet.       Caffeine use: Coffee, Tea, Carbonated beverages, 3 servings / day.                 Social Drivers of Corporate investment banker Strain: Low Risk  (10/27/2023)   Overall Financial Resource Strain (CARDIA)    Difficulty of Paying Living Expenses: Not hard at all  Food Insecurity: No Food Insecurity (10/27/2023)   Hunger Vital Sign    Worried About Running Out of Food in the Last Year: Never true    Ran Out of Food in the Last Year:  Never true  Transportation Needs: No Transportation Needs (10/27/2023)   PRAPARE - Administrator, Civil Service (Medical): No    Lack of Transportation (Non-Medical): No  Physical Activity: Insufficiently Active (10/27/2023)   Exercise Vital Sign    Days of Exercise per Week: 3 days    Minutes of Exercise per Session: 20 min  Stress: No Stress Concern Present (10/27/2023)   [employer] of Occupational Health - Occupational Stress Questionnaire    Feeling of Stress: Not at all  Social Connections: Unknown (10/27/2023)   Social Connection and Isolation Panel    Frequency of Communication with Friends and Family: Not on file    Frequency of Social Gatherings with Friends and Family: Twice a week    Attends Religious Services: More than 4 times per year    Active Member of [employer] or Organizations: Yes    Attends Banker Meetings: More than 4 times per year    Marital Status: Married  Catering manager Violence: Not At Risk (04/24/2022)   Humiliation, Afraid, Rape, and Kick questionnaire    Fear of Current or Ex-Partner: No    Emotionally Abused: No    Physically Abused: No    Sexually Abused: No    Review of Systems  Constitutional:  Negative for fatigue.  Respiratory:  Positive for apnea.   Psychiatric/Behavioral:  Positive for sleep disturbance.     Vitals:   12/08/23 1002  BP: 121/76  Pulse: 63  SpO2: 95%     Physical Exam Constitutional:      Appearance: Normal appearance.  HENT:     Head: Normocephalic.     Nose: Nose normal.  Mouth/Throat:     Mouth: Mucous membranes are moist.  Eyes:     General: No scleral icterus. Cardiovascular:     Rate and Rhythm: Normal rate and regular rhythm.     Heart sounds: No murmur heard.    No friction rub.  Pulmonary:     Effort: No respiratory distress.     Breath sounds: No stridor. No wheezing or rhonchi.  Musculoskeletal:     Cervical back: No rigidity or tenderness.  Neurological:     Mental  Status: She is alert.  Psychiatric:        Mood and Affect: Mood normal.      Data Reviewed: Previous sleep study result reviewed - AHI of 52.9  Compliance data reviewed showing 60% compliance Average use of 5 hours 41 minutes AutoSet 5-14, 95 percentile pressure of 13.2 Residual AHI of 5.9  Most recent PFT from June 2024 with no obstruction, no restriction, normal diffusing capacity  Assessment:  Severe obstructive sleep apnea - Still having issues with CPAP - Most of the time it is a mask issue - Recovering from a respiratory infection - Mask issues   History of asthma -Albuterol  as needed  Recent cough, from a respiratory infection - On Wixela   Plan/Recommendations: DME referral for mask supplies - May need a new mask fitting  Continue Wixela  Continue albuterol  use as needed  May try melatonin 3 mg, may advance to 5 or 10 mg as tolerated, this may help keep the muscles relax that she is not taking the mask off inadvertently  Follow-up in 6 months   Jennet Epley MD [facility] 12/08/2023, 10:14 AM  CC: Avelina Greig BRAVO, MD

## 2023-12-09 ENCOUNTER — Ambulatory Visit: Payer: Self-pay | Admitting: Family Medicine

## 2023-12-10 LAB — URINE CULTURE
MICRO NUMBER:: 16852194
SPECIMEN QUALITY:: ADEQUATE

## 2023-12-10 NOTE — Telephone Encounter (Signed)
 Copied from CRM [id number]. Topic: Clinical - Lab/Test Results >> Dec 10, 2023 10:50 AM Chasity T wrote: Reason for CRM: Patient returning call to Khiry Pasquariello regarding test results. Please contact back to discuss.

## 2023-12-10 NOTE — Telephone Encounter (Signed)
 Urine culture results and Dr. Sherrel comments discussed with patient via telephone.  She states she has one more pill to take but is feeling much better.  She will call us  back if symptoms are not completed resolved once she completes the full course of antibiotics.  FYI to Dr. Avelina.

## 2023-12-14 DIAGNOSIS — Z961 Presence of intraocular lens: Secondary | ICD-10-CM | POA: Diagnosis not present

## 2023-12-14 DIAGNOSIS — H43813 Vitreous degeneration, bilateral: Secondary | ICD-10-CM | POA: Diagnosis not present

## 2023-12-14 DIAGNOSIS — H18451 Nodular corneal degeneration, right eye: Secondary | ICD-10-CM | POA: Diagnosis not present

## 2023-12-27 ENCOUNTER — Ambulatory Visit
Admission: RE | Admit: 2023-12-27 | Discharge: 2023-12-27 | Disposition: A | Discharge status: Home / Self Care | Source: Ambulatory Visit | Attending: Family Medicine | Admitting: Family Medicine

## 2023-12-27 DIAGNOSIS — Z1231 Encounter for screening mammogram for malignant neoplasm of breast: Secondary | ICD-10-CM | POA: Diagnosis not present

## 2023-12-30 ENCOUNTER — Ambulatory Visit: Payer: Self-pay | Admitting: Family Medicine

## 2024-01-20 DIAGNOSIS — R051 Acute cough: Secondary | ICD-10-CM | POA: Diagnosis not present

## 2024-02-06 ENCOUNTER — Other Ambulatory Visit: Payer: Self-pay | Admitting: Family Medicine

## 2024-02-07 ENCOUNTER — Encounter: Payer: Self-pay | Admitting: Family Medicine

## 2024-02-07 ENCOUNTER — Ambulatory Visit: Admitting: Family Medicine

## 2024-02-07 VITALS — BP 124/72 | HR 73 | Temp 97.6°F | Ht 62.0 in | Wt 169.1 lb

## 2024-02-07 DIAGNOSIS — R059 Cough, unspecified: Secondary | ICD-10-CM | POA: Insufficient documentation

## 2024-02-07 DIAGNOSIS — J029 Acute pharyngitis, unspecified: Secondary | ICD-10-CM | POA: Diagnosis not present

## 2024-02-07 DIAGNOSIS — R052 Subacute cough: Secondary | ICD-10-CM

## 2024-02-07 DIAGNOSIS — J4531 Mild persistent asthma with (acute) exacerbation: Secondary | ICD-10-CM

## 2024-02-07 DIAGNOSIS — J0100 Acute maxillary sinusitis, unspecified: Secondary | ICD-10-CM

## 2024-02-07 DIAGNOSIS — J4530 Mild persistent asthma, uncomplicated: Secondary | ICD-10-CM

## 2024-02-07 LAB — POCT RAPID STREP A (OFFICE): Rapid Strep A Screen: NEGATIVE

## 2024-02-07 LAB — POC COVID19 BINAXNOW: SARS Coronavirus 2 Ag: NEGATIVE

## 2024-02-07 MED ORDER — DOXYCYCLINE HYCLATE 100 MG PO TABS: 100.0000 mg | ORAL_TABLET | Freq: Two times a day (BID) | ORAL | 0 refills | Status: DC

## 2024-02-07 MED ORDER — BENZONATATE 200 MG PO CAPS: 200.0000 mg | ORAL_CAPSULE | Freq: Three times a day (TID) | ORAL | 1 refills | Status: AC | PRN

## 2024-02-07 NOTE — Patient Instructions (Addendum)
 Covid and strep tests are negative   Take doxycycline  for sinus infection  It may help bronchitis also  Prometh  dm for cough  Try benzonatate  also for cough   Albuterol  as needed Wixala twice daily for at least 2 weeks     Update if not starting to improve in a week or if worsening   Rest and fluids best you can

## 2024-02-07 NOTE — Assessment & Plan Note (Signed)
 Rst neg

## 2024-02-07 NOTE — Assessment & Plan Note (Signed)
 S/p uri  Doxycycline  prescription  See a/p for asthmatic bronchitis as well   [facility] notes and pcp notes reviewed today   Is all to pcn and cephalosporins

## 2024-02-07 NOTE — Progress Notes (Signed)
 Subjective:    Patient ID: Gina Harper Peasant, female    DOB: [birth year], 73 y.o.   MRN: [id number]  HPI  Wt Readings from Last 3 Encounters:  02/07/24 169 lb 2 oz (76.7 kg)  12/08/23 172 lb (78 kg)  12/07/23 171 lb 8 oz (77.8 kg)   30.93 kg/m  Vitals:   02/07/24 1418  BP: 124/72  Pulse: 73  Temp: 97.6 F (36.4 C)  SpO2: 98%   73 yo pt of Dr Avelina presents with  Cough Congestion   Was seen at [facility] on 10/2 for uri symptoms/10 days  (prior to that treated with doxy from e visit)  Treated for sinus infection and bronchitis   (thinks she had covid, husband had it)  Duo neb inhaler  Antibiotic levaquin Prednisone  Albuterol  mdi Prometh  dm  Cbc normal  Cxr  report Imaging Results - X-ray chest PA and lateral (01/20/2024 10:20 AM EDT) Narrative  01/20/2024 8:26 PM EDT  EXAM: X-ray chest PA and lateral  INDICATION: cough x 10-12 days, fever several days ago Acute cough [R05.1 (ICD-10-CM)] None COMPARISON: None  FINDINGS: No acute osseus abnormality. Pleural spaces are normal. Mild diffuse reticular pulmonary opacities. No focal consolidation or mass. Normal appearance of the heart and mediastinum.    Former smoker Asthma- wixela inhaler     Now  Not better  Horrible cough - on and on  Green phlegm- taste terrible  No energy  No fever  Some wheezing   (has asthma) --has wixela 100-50 but not using   Sinuses are painful  Both sides equally  Still has fullness/ fluid in ears -not painful  Throat is sore   Does think she got better and then caught 2nd thing   Just started prometh  dm   Results for orders placed or performed in visit on 02/07/24  POCT rapid strep A   Collection Time: 02/07/24  2:42 PM  Result Value Ref Range   Rapid Strep A Screen Negative Negative  POC COVID-19 BinaxNow   Collection Time: 02/07/24  2:42 PM  Result Value Ref Range   SARS Coronavirus 2 Ag Negative Negative     Patient Active Problem List   Diagnosis Date Noted    Sore throat 02/07/2024   Cough 02/07/2024   Asthmatic bronchitis 10/28/2023   Pre-op evaluation 07/20/2023   Severe obstructive sleep apnea 08/18/2022   Sinus infection 08/18/2022   Obesity (BMI 30.0-34.9) 05/26/2022   Thyroid  nodule 04/28/2022   Breast pain, left 04/28/2022   Degeneration of lumbar intervertebral disc 11/20/2021   Acute cystitis without hematuria 07/28/2021   GERD (gastroesophageal reflux disease) 04/05/2020   Syncope 04/05/2020   Memory loss 04/05/2020   Abnormal findings on diagnostic imaging of lung 04/05/2020   Lung nodule 03/10/2019   Aortic atherosclerosis 03/10/2019   Chronic insomnia 08/06/2016   Osteopenia 01/14/2016   Venous insufficiency, peripheral 11/18/2015   B12 deficiency 11/18/2015   Seasonal allergies 11/18/2015   Chronic dermatitis of hands 01/31/2014   Peripheral neuropathy 01/31/2014   Prediabetes 11/08/2013   MDD (major depressive disorder), single episode, moderate (HCC) 04/05/2012   Pure hypercholesterolemia 04/05/2012   Atrophic vaginitis 04/05/2012   Elevated CEA 04/05/2012   Mild persistent asthma 04/05/2012   Essential hypertension, benign 04/05/2012   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HCC)    rectal spasm,syncope, nausea, ongoing for years   Allergic rhinitis, cause unspecified    Allergy    Alopecia, unspecified  Anemia    pernicious anemia   Arthritis    hands, knees, hips   Asthma    Cancer (HCC)    melanoma   Chicken pox    Depressive disorder, not elsewhere classified    Disorder of bone and cartilage, unspecified    Esophageal reflux    GERD (gastroesophageal reflux disease)    Hepatitis    History of shingles    Insomnia, unspecified    Measles    red measles   Motion sickness    Mumps    Other abnormal glucose    Palpitations    Syncope, vasovagal    Tobacco use disorder    Unspecified asthma(493.90)    exercise induced. Never hospitalized   Unspecified essential  hypertension    Unspecified hearing loss    Unspecified pruritic disorder    Wears hearing aid in both ears    Past Surgical History:  Procedure Laterality Date   bladder tact  1995   BROW LIFT Bilateral 07/09/2015   Procedure: BLEPHAROPLASTY;  Surgeon: Greig CHRISTELLA Gay, MD;  Location: [facility];  Service: Ophthalmology;  Laterality: Bilateral;   BUNIONECTOMY  [id number]   bilateral   CATARACT EXTRACTION W/PHACO Left 08/05/2022   Procedure: CATARACT EXTRACTION PHACO AND INTRAOCULAR LENS PLACEMENT (IOC) LEFT  8.47  01:06.2;  Surgeon: Mittie Gaskin, MD;  Location: [facility];  Service: Ophthalmology;  Laterality: Left;   CATARACT EXTRACTION W/PHACO Right 08/19/2022   Procedure: CATARACT EXTRACTION PHACO AND INTRAOCULAR LENS PLACEMENT (IOC) RIGHT CLAREON  TORIC  4.43  00:33.2;  Surgeon: Mittie Gaskin, MD;  Location: [facility];  Service: Ophthalmology;  Laterality: Right;   colonoscopy  04/20/2004   no polyps; normal.  Repeat in 10 years.  [place].   COLONOSCOPY WITH PROPOFOL  N/A 05/20/2015   Procedure: COLONOSCOPY WITH PROPOFOL ;  Surgeon: Gladis RAYMOND Mariner, MD;  Location: [facility];  Service: Endoscopy;  Laterality: N/A;   COLONOSCOPY WITH PROPOFOL  N/A 09/25/2020   Procedure: COLONOSCOPY WITH PROPOFOL ;  Surgeon: Toledo, Ladell POUR, MD;  Location: [facility];  Service: Gastroenterology;  Laterality: N/A;   COSMETIC SURGERY     rhinoplasty   ESOPHAGOGASTRODUODENOSCOPY  03/30/2005   PTOSIS REPAIR Bilateral 07/09/2015   Procedure: PTOSIS REPAIR;  Surgeon: Greig CHRISTELLA Gay, MD;  Location: [facility];  Service: Ophthalmology;  Laterality: Bilateral;   rhinoplasty with septoplasty     TONSILLECTOMY AND ADENOIDECTOMY     TUBAL LIGATION     VAGINAL HYSTERECTOMY  1997   menorrhagia ovaries intact   Social History   Tobacco Use   Smoking status: Former    Current packs/day: 0.00    Average packs/day: 2.0 packs/day for 17.0 years (34.0 ttl pk-yrs)     Types: Cigarettes    Start date: 05/23/1971    Quit date: 05/22/1988    Years since quitting: 35.7   Smokeless tobacco: Never   Tobacco comments:    Quit 22 years ago, 1990  Vaping Use   Vaping status: Never Used  Substance Use Topics   Alcohol use: Yes    Alcohol/week: 6.0 standard drinks of alcohol    Types: 6 Standard drinks or equivalent per week    Comment: moderate white wine 5 glasses per week   Drug use: No   Family History  Problem Relation Age of Onset   Cancer Mother        Lung, Bladder   Stroke Father 63       TIAs   Heart disease Father  80       AMI x 2.   Colon polyps Father    Thyroid  disease Sister    Cancer Brother        lung   Breast cancer Maternal Aunt 60   Breast cancer Maternal Aunt    Allergies  Allergen Reactions   Cefdinir Hives   Lisinopril  Cough and Other (See Comments)   Meloxicam Swelling   Omnipen [Ampicillin] Other (See Comments)   Augmentin [Amoxicillin-Pot Clavulanate] Rash   Clarithromycin Rash   Current Outpatient Medications on File Prior to Visit  Medication Sig Dispense Refill   albuterol  (VENTOLIN  HFA) 108 (90 Base) MCG/ACT inhaler Inhale 2 puffs into the lungs every 6 (six) hours as needed for wheezing or shortness of breath. 8 g 2   ALPHA LIPOIC ACID PO Take 1,200 mg by mouth daily.     Biotin 89999 MCG TABS Take 1 tablet by mouth daily.     cetirizine (ZYRTEC) 10 MG tablet Take 10 mg by mouth as needed.      Cholecalciferol (VITAMIN D3) 2000 UNITS TABS Take by mouth daily.      fluticasone -salmeterol (WIXELA INHUB) 100-50 MCG/ACT AEPB Inhale 1 puff into the lungs 2 (two) times daily.     hydrochlorothiazide  (HYDRODIURIL ) 25 MG tablet TAKE 1 TABLET BY MOUTH DAILY 100 tablet 0   Multiple Vitamin (MULTI-VITAMINS) TABS Take by mouth daily.     Potassium Gluconate 550 MG TABS Take by mouth daily.      promethazine  (PHENERGAN ) 25 MG suppository Place 1 suppository (25 mg total) rectally every 6 (six) hours as needed. 12 each 1    vitamin B-12 (CYANOCOBALAMIN ) 1000 MCG tablet Take 1,000 mcg by mouth daily.     No current facility-administered medications on file prior to visit.    Review of Systems  Constitutional:  Positive for appetite change and fatigue. Negative for fever.  HENT:  Positive for congestion, hearing loss, postnasal drip, rhinorrhea, sinus pressure, sinus pain, sneezing and sore throat. Negative for ear discharge and ear pain.   Eyes:  Negative for pain and discharge.  Respiratory:  Positive for cough and wheezing. Negative for shortness of breath and stridor.   Cardiovascular:  Negative for chest pain.  Gastrointestinal:  Negative for diarrhea, nausea and vomiting.  Genitourinary:  Negative for frequency, hematuria and urgency.  Musculoskeletal:  Negative for arthralgias and myalgias.  Skin:  Negative for rash.  Neurological:  Positive for headaches. Negative for dizziness, weakness and light-headedness.  Psychiatric/Behavioral:  Negative for confusion and dysphoric mood.        Objective:   Physical Exam Constitutional:      General: She is not in acute distress.    Appearance: Normal appearance. She is well-developed. She is obese. She is not ill-appearing, toxic-appearing or diaphoretic.  HENT:     Head: Normocephalic and atraumatic.     Comments: Nares are injected and congested    Bilat frontal and maxillary sinus tenderness     Right Ear: Tympanic membrane, ear canal and external ear normal.     Left Ear: Tympanic membrane, ear canal and external ear normal.     Ears:     Comments: Dull TMs     Nose: Congestion and rhinorrhea present.     Mouth/Throat:     Mouth: Mucous membranes are moist.     Pharynx: Oropharynx is clear. No oropharyngeal exudate or posterior oropharyngeal erythema.     Comments: Clear pnd  Eyes:     General:  Right eye: No discharge.        Left eye: No discharge.     Conjunctiva/sclera: Conjunctivae normal.     Pupils: Pupils are equal, round, and  reactive to light.  Cardiovascular:     Rate and Rhythm: Normal rate.     Heart sounds: Normal heart sounds.  Pulmonary:     Effort: Pulmonary effort is normal. No respiratory distress.     Breath sounds: Normal breath sounds. No stridor. No wheezing, rhonchi or rales.     Comments: Good air exch No wheeze even on forced exp  Some upper airway sounds  Chest:     Chest wall: No tenderness.  Musculoskeletal:     Cervical back: Normal range of motion and neck supple.  Lymphadenopathy:     Cervical: No cervical adenopathy.  Skin:    General: Skin is warm and dry.     Capillary Refill: Capillary refill takes less than 2 seconds.     Findings: No rash.  Neurological:     Mental Status: She is alert.     Cranial Nerves: No cranial nerve deficit.  Psychiatric:        Mood and Affect: Mood normal.           Assessment & Plan:   Problem List Items Addressed This Visit       Respiratory   Sinus infection   S/p uri  Doxycycline  prescription  See a/p for asthmatic bronchitis as well   Uc notes and pcp notes reviewed today   Is all to pcn and cephalosporins       Relevant Medications   doxycycline  (VIBRA -TABS) 100 MG tablet   benzonatate  (TESSALON ) 200 MG capsule   Mild persistent asthma   In setting of uri  Urged to use her wixela inhaler bid for at least 2 weeks Overall reassuring exam       Relevant Medications   fluticasone -salmeterol (WIXELA INHUB) 100-50 MCG/ACT AEPB   Asthmatic bronchitis - Primary   S/p uri - sick for over a month  May have started with covid -treatment with antibiotic and prednisone  early on but likely viral then (had reticular pulmonary opacities then)  Reviewed [facility] records, lab results and studies in detail     Now more symptoms of bacterial sinusitis along with persistent cough No wheezing on exam here but some at home   Neg covid and strep tests  Disc symptomatic care - see instructions on AVS   Prometh  dm and tessalon  for cough   Wixela and albuterol  for wheeze Doxycyline for sinus and lung bacterial cov (cannot take pcn or ceph)  Fluids/rest   Monitor closely given length of ilness Update if not starting to improve in a week or if worsening  Call back and Er precautions noted in detail today          Relevant Medications   fluticasone -salmeterol (WIXELA INHUB) 100-50 MCG/ACT AEPB   Other Relevant Orders   POC COVID-19 BinaxNow (Completed)     Other   Sore throat   Rst neg       Relevant Orders   POCT rapid strep A (Completed)   Cough   Subacute Sick since September /not getting better  Some wheezing   See a/p asthmatic bronchitis

## 2024-02-07 NOTE — Assessment & Plan Note (Addendum)
 S/p uri - sick for over a month  May have started with covid -treatment with antibiotic and prednisone  early on but likely viral then (had reticular pulmonary opacities then)  Reviewed [facility] records, lab results and studies in detail     Now more symptoms of bacterial sinusitis along with persistent cough No wheezing on exam here but some at home   Neg covid and strep tests  Disc symptomatic care - see instructions on AVS   Prometh  dm and tessalon  for cough  Wixela and albuterol  for wheeze Doxycyline for sinus and lung bacterial cov (cannot take pcn or ceph)  Fluids/rest   Monitor closely given length of ilness Update if not starting to improve in a week or if worsening  Call back and Er precautions noted in detail today

## 2024-02-07 NOTE — Assessment & Plan Note (Signed)
 In setting of uri  Urged to use her wixela inhaler bid for at least 2 weeks Overall reassuring exam

## 2024-02-07 NOTE — Assessment & Plan Note (Addendum)
 Subacute Sick since September /not getting better  Some wheezing   See a/p asthmatic bronchitis

## 2024-02-08 ENCOUNTER — Ambulatory Visit: Admitting: Family Medicine

## 2024-02-23 ENCOUNTER — Ambulatory Visit: Payer: Self-pay

## 2024-02-23 NOTE — Telephone Encounter (Signed)
  FYI Only or Action Required?: Action required by provider: request for appointment.  Patient was last seen in primary care on 02/07/2024 by Randeen Laine LABOR, MD.  Called Nurse Triage reporting Cough.  Symptoms began several months ago. Interventions attempted: Prescription medications:  SABRA  Symptoms are: unchanged.On going cough.States she has been on several antibiotics and is still coughing.  Triage Disposition: See Physician Within 24 Hours  Patient/caregiver understands and will follow disposition?: Yes    Copied from CRM #[id number]. Topic: Clinical - Red Word Triage >> Feb 23, 2024 10:52 AM Deaijah H wrote: Red Word that prompted transfer to Nurse Triage: Persistent cough and coughed up green mucus soon as antibiotics was gone. Still currently coughing up green mucus Reason for Disposition  [1] Continuous (nonstop) coughing interferes with work or school AND [2] no improvement using cough treatment per Care Advice  Answer Assessment - Initial Assessment Questions 1. ONSET: When did the cough begin?      September 2. SEVERITY: How bad is the cough today?      severe 3. SPUTUM: Describe the color of your sputum (e.g., none, dry cough; clear, white, yellow, green)     green 4. HEMOPTYSIS: Are you coughing up any blood? If Yes, ask: How much? (e.g., flecks, streaks, tablespoons, etc.)     no 5. DIFFICULTY BREATHING: Are you having difficulty breathing? If Yes, ask: How bad is it? (e.g., mild, moderate, severe)      mild 6. FEVER: Do you have a fever? If Yes, ask: What is your temperature, how was it measured, and when did it start?     no 7. CARDIAC HISTORY: Do you have any history of heart disease? (e.g., heart attack, congestive heart failure)      no 8. LUNG HISTORY: Do you have any history of lung disease?  (e.g., pulmonary embolus, asthma, emphysema)     yes 9. PE RISK FACTORS: Do you have a history of blood clots? (or: recent major surgery, recent  prolonged travel, bedridden)     no 10. OTHER SYMPTOMS: Do you have any other symptoms? (e.g., runny nose, wheezing, chest pain)       wheezing 11. PREGNANCY: Is there any chance you are pregnant? When was your last menstrual period?       no 12. TRAVEL: Have you traveled out of the country in the last month? (e.g., travel history, exposures)       no  Protocols used: Cough - Acute Productive-A-AH

## 2024-02-25 ENCOUNTER — Ambulatory Visit (INDEPENDENT_AMBULATORY_CARE_PROVIDER_SITE_OTHER): Admitting: Family Medicine

## 2024-02-25 VITALS — BP 122/82 | HR 69 | Temp 98.2°F | Ht 62.0 in | Wt 167.6 lb

## 2024-02-25 DIAGNOSIS — J324 Chronic pansinusitis: Secondary | ICD-10-CM | POA: Diagnosis not present

## 2024-02-25 DIAGNOSIS — J320 Chronic maxillary sinusitis: Secondary | ICD-10-CM | POA: Diagnosis not present

## 2024-02-25 MED ORDER — FLUTICASONE-SALMETEROL 250-50 MCG/ACT IN AEPB: 1.0000 | INHALATION_SPRAY | Freq: Two times a day (BID) | RESPIRATORY_TRACT | Status: AC

## 2024-02-25 MED ORDER — HYDROCODONE BIT-HOMATROP MBR 5-1.5 MG/5ML PO SOLN: 5.0000 mL | Freq: Every evening | ORAL | 0 refills | Status: DC | PRN

## 2024-02-25 NOTE — Assessment & Plan Note (Signed)
 Following COVID, patient now with chronic sinusitis symptoms.  Status post 2 courses of prednisone , 2 courses of antibiotics including levofloxacin and doxycycline . Associated with possible asthma exacerbation. No clear sign of infection in office today but will move forward with a CT scan of the sinuses to rule out chronic bacterial sinusitis.  No clear sign of current bacterial infection.  Start  nasal saline rises daily.  Can try alternate cough suppressant at night.  Increase Wixella temporarily to 250/50 twice daily.

## 2024-02-25 NOTE — Patient Instructions (Addendum)
 Start  nasal saline rises daily.  Can try alternate cough suppressant at night.  Schedule sinus CT.   Increase Wixella temporarily to 250/50 twice daily.

## 2024-02-25 NOTE — Progress Notes (Signed)
 Patient ID: Gina Harper, female    DOB: Jun 22, 1950, 73 y.o.   MRN: [id number]  This visit was conducted in person.  BP 122/82   Pulse 69   Temp 98.2 F (36.8 C) (Oral)   Ht 5' 2 (1.575 m)   Wt 167 lb 9.6 oz (76 kg)   SpO2 92%   BMI 30.65 kg/m    CC:  Chief Complaint  Patient presents with   Follow-up    Patient states this is a f/u from couple weeks ago. Still experiencing a cough.    Subjective:   HPI: Gina Harper is a 73 y.o. female presenting on 02/25/2024 for Follow-up (Patient states this is a f/u from couple weeks ago. Still experiencing a cough.)   On trip to [place].. had COVID 01/08/2024   Urgent Care visit for congestion and cough.. treated with  prednisone  and levofloxacin.  WBC 9.9  CXR: 10/2 FINDINGS:  No acute osseus abnormality.  Pleural spaces are normal.  Mild diffuse reticular pulmonary opacities.  No focal consolidation or mass.  Normal appearance of the heart and mediastinum.   She had some improvement... after antibiotics symptoms return.  Seen by Dr Randeen on 02/07/2024 For asthma exacerbation, acute sinus infection  Neg strep and COVID  Treated with Doxy x 10 days and cough suppressant.   She was feeling better until doxy completed.  Date of onset:   September 20 Initial symptoms included  cough, congestion Symptoms progressed to  Productive cough,  darker.... feels like coming from head.  Feeling sinus pressure and pain bilaterally.   Coughing fits.  Low grade temp. Bilateral ear pressure and fullness.   Sick contacts: husband COVID testing:   none     She has tried to treat with  wixella    No history of chronic lung disease such as asthma or COPD. Non-smoker.      Relevant past medical, surgical, family and social history reviewed and updated as indicated. Interim medical history since our last visit reviewed. Allergies and medications reviewed and updated. Outpatient Medications Prior to Visit  Medication Sig Dispense  Refill   albuterol  (VENTOLIN  HFA) 108 (90 Base) MCG/ACT inhaler Inhale 2 puffs into the lungs every 6 (six) hours as needed for wheezing or shortness of breath. 8 g 2   ALPHA LIPOIC ACID PO Take 1,200 mg by mouth daily.     benzonatate  (TESSALON ) 200 MG capsule Take 1 capsule (200 mg total) by mouth 3 (three) times daily as needed for cough. Swallow whole 30 capsule 1   Biotin 10000 MCG TABS Take 1 tablet by mouth daily.     cetirizine (ZYRTEC) 10 MG tablet Take 10 mg by mouth as needed.      Cholecalciferol (VITAMIN D3) 2000 UNITS TABS Take by mouth daily.      doxycycline  (VIBRA -TABS) 100 MG tablet Take 1 tablet (100 mg total) by mouth 2 (two) times daily. 20 tablet 0   FLUoxetine  (PROZAC ) 10 MG capsule Take 10 mg by mouth daily.     hydrochlorothiazide  (HYDRODIURIL ) 25 MG tablet TAKE 1 TABLET BY MOUTH DAILY 100 tablet 0   Multiple Vitamin (MULTI-VITAMINS) TABS Take by mouth daily.     Potassium Gluconate 550 MG TABS Take by mouth daily.      promethazine  (PHENERGAN ) 25 MG suppository Place 1 suppository (25 mg total) rectally every 6 (six) hours as needed. 12 each 1   vitamin B-12 (CYANOCOBALAMIN ) 1000 MCG tablet Take 1,000 mcg by mouth  daily.     fluticasone -salmeterol (WIXELA INHUB) 100-50 MCG/ACT AEPB Inhale 1 puff into the lungs 2 (two) times daily.     No facility-administered medications prior to visit.     Per HPI unless specifically indicated in ROS section below Review of Systems  Constitutional:  Negative for fatigue and fever.  HENT:  Positive for congestion, sinus pressure and sinus pain.   Eyes:  Negative for pain.  Respiratory:  Positive for cough and wheezing. Negative for shortness of breath.   Cardiovascular:  Negative for chest pain, palpitations and leg swelling.  Gastrointestinal:  Negative for abdominal pain.  Genitourinary:  Negative for dysuria and vaginal bleeding.  Musculoskeletal:  Negative for back pain.  Neurological:  Negative for syncope, light-headedness  and headaches.  Psychiatric/Behavioral:  Negative for dysphoric mood.    Objective:  BP 122/82   Pulse 69   Temp 98.2 F (36.8 C) (Oral)   Ht 5' 2 (1.575 m)   Wt 167 lb 9.6 oz (76 kg)   SpO2 92%   BMI 30.65 kg/m   Wt Readings from Last 3 Encounters:  02/25/24 167 lb 9.6 oz (76 kg)  02/07/24 169 lb 2 oz (76.7 kg)  12/08/23 172 lb (78 kg)      Physical Exam Constitutional:      General: She is not in acute distress.    Appearance: She is well-developed. She is not ill-appearing or toxic-appearing.  HENT:     Head: Normocephalic.     Right Ear: Hearing, tympanic membrane, ear canal and external ear normal. Tympanic membrane is not erythematous, retracted or bulging.     Left Ear: Hearing, tympanic membrane, ear canal and external ear normal. Tympanic membrane is not erythematous, retracted or bulging.     Nose: Mucosal edema and congestion present. No rhinorrhea.     Right Sinus: Maxillary sinus tenderness and frontal sinus tenderness present.     Left Sinus: Maxillary sinus tenderness and frontal sinus tenderness present.     Mouth/Throat:     Pharynx: Uvula midline.  Eyes:     General: Lids are normal. Lids are everted, no foreign bodies appreciated.     Conjunctiva/sclera: Conjunctivae normal.     Pupils: Pupils are equal, round, and reactive to light.  Neck:     Thyroid : No thyroid  mass or thyromegaly.     Vascular: No carotid bruit.     Trachea: Trachea normal.  Cardiovascular:     Rate and Rhythm: Normal rate and regular rhythm.     Pulses: Normal pulses.     Heart sounds: Normal heart sounds, S1 normal and S2 normal. No murmur heard.    No friction rub. No gallop.  Pulmonary:     Effort: Pulmonary effort is normal. No tachypnea or respiratory distress.     Breath sounds: Normal breath sounds. No decreased breath sounds, wheezing, rhonchi or rales.  Musculoskeletal:     Cervical back: Normal range of motion and neck supple.  Skin:    General: Skin is warm and  dry.     Findings: No rash.  Neurological:     Mental Status: She is alert.  Psychiatric:        Mood and Affect: Mood is not anxious or depressed.        Speech: Speech normal.        Behavior: Behavior normal. Behavior is cooperative.        Judgment: Judgment normal.       Results for orders placed  or performed in visit on 02/07/24  POCT rapid strep A   Collection Time: 02/07/24  2:42 PM  Result Value Ref Range   Rapid Strep A Screen Negative Negative  POC COVID-19 BinaxNow   Collection Time: 02/07/24  2:42 PM  Result Value Ref Range   SARS Coronavirus 2 Ag Negative Negative    Assessment and Plan  Chronic pansinusitis Assessment & Plan: Following COVID, patient now with chronic sinusitis symptoms.  Status post 2 courses of prednisone , 2 courses of antibiotics including levofloxacin and doxycycline . Associated with possible asthma exacerbation. No clear sign of infection in office today but will move forward with a CT scan of the sinuses to rule out chronic bacterial sinusitis.  No clear sign of current bacterial infection.  Start  nasal saline rises daily.  Can try alternate cough suppressant at night.  Increase Wixella temporarily to 250/50 twice daily.  Orders: -     CT SINUS WO CONTRAST; Future  Chronic maxillary sinusitis Assessment & Plan: Following COVID, patient now with chronic sinusitis symptoms.  Status post 2 courses of prednisone , 2 courses of antibiotics including levofloxacin and doxycycline . Associated with possible asthma exacerbation. No clear sign of infection in office today but will move forward with a CT scan of the sinuses to rule out chronic bacterial sinusitis.  No clear sign of current bacterial infection.  Start  nasal saline rises daily.  Can try alternate cough suppressant at night.  Increase Wixella temporarily to 250/50 twice daily.   Other orders -     HYDROcodone Bit-Homatrop MBr; Take 5 mLs by mouth at bedtime as needed for cough.   Dispense: 120 mL; Refill: 0 -     Fluticasone -Salmeterol; Inhale 1 puff into the lungs in the morning and at bedtime.    No follow-ups on file.   Greig Ring, MD

## 2024-02-28 ENCOUNTER — Encounter: Payer: Self-pay | Admitting: *Deleted

## 2024-03-01 ENCOUNTER — Telehealth: Payer: Self-pay | Admitting: *Deleted

## 2024-03-01 NOTE — Telephone Encounter (Signed)
 Request received from [facility] for CPAP sleep study.  Study faxed on 02/09/2024 to [phone].  Received confirmation fax that it was sent successfully.  Nothing further needed.

## 2024-03-06 ENCOUNTER — Ambulatory Visit
Admission: RE | Admit: 2024-03-06 | Discharge: 2024-03-06 | Disposition: A | Discharge status: Home / Self Care | Source: Ambulatory Visit | Attending: Family Medicine | Admitting: Family Medicine

## 2024-03-06 DIAGNOSIS — J324 Chronic pansinusitis: Secondary | ICD-10-CM | POA: Insufficient documentation

## 2024-03-07 ENCOUNTER — Ambulatory Visit: Payer: Self-pay | Admitting: Family Medicine

## 2024-03-07 DIAGNOSIS — R930 Abnormal findings on diagnostic imaging of skull and head, not elsewhere classified: Secondary | ICD-10-CM

## 2024-03-09 NOTE — Telephone Encounter (Unsigned)
 Copied from CRM #[id number]. Topic: Referral - Question >> Mar 09, 2024 11:23 AM Adelita E wrote: Reason for CRM: Patient stated she spoke with Dr. Moody office in regards to a referral that was placed. That office stated they did not receive the referral yet, agent relayed that the status was still pending. But patient was able to schedule an appointment for 04/04/2024, patient just wanted PCP to be aware.

## 2024-03-15 NOTE — Progress Notes (Signed)
 [facility] can see everything in CareEverywhere, all labs and pertinent clinicals needed are available there.   I faxed the referral in case

## 2024-05-03 ENCOUNTER — Encounter: Payer: Self-pay | Admitting: Family Medicine

## 2024-05-03 ENCOUNTER — Ambulatory Visit: Payer: Medicare Other | Admitting: Family Medicine

## 2024-05-03 VITALS — BP 122/80 | HR 68 | Temp 98.0°F | Ht 61.75 in | Wt 155.5 lb

## 2024-05-03 DIAGNOSIS — Z0000 Encounter for general adult medical examination without abnormal findings: Secondary | ICD-10-CM | POA: Diagnosis not present

## 2024-05-03 DIAGNOSIS — E538 Deficiency of other specified B group vitamins: Secondary | ICD-10-CM

## 2024-05-03 DIAGNOSIS — R7303 Prediabetes: Secondary | ICD-10-CM | POA: Diagnosis not present

## 2024-05-03 DIAGNOSIS — R413 Other amnesia: Secondary | ICD-10-CM

## 2024-05-03 DIAGNOSIS — G4733 Obstructive sleep apnea (adult) (pediatric): Secondary | ICD-10-CM | POA: Diagnosis not present

## 2024-05-03 DIAGNOSIS — E7800 Pure hypercholesterolemia, unspecified: Secondary | ICD-10-CM

## 2024-05-03 DIAGNOSIS — I10 Essential (primary) hypertension: Secondary | ICD-10-CM

## 2024-05-03 DIAGNOSIS — M8580 Other specified disorders of bone density and structure, unspecified site: Secondary | ICD-10-CM

## 2024-05-03 DIAGNOSIS — F321 Major depressive disorder, single episode, moderate: Secondary | ICD-10-CM

## 2024-05-03 DIAGNOSIS — E041 Nontoxic single thyroid nodule: Secondary | ICD-10-CM

## 2024-05-03 DIAGNOSIS — J4530 Mild persistent asthma, uncomplicated: Secondary | ICD-10-CM

## 2024-05-03 LAB — COMPREHENSIVE METABOLIC PANEL WITH GFR
ALT: 24 U/L (ref 3–35)
AST: 21 U/L (ref 5–37)
Albumin: 4.6 g/dL (ref 3.5–5.2)
Alkaline Phosphatase: 60 U/L (ref 39–117)
BUN: 18 mg/dL (ref 6–23)
CO2: 31 meq/L (ref 19–32)
Calcium: 9.7 mg/dL (ref 8.4–10.5)
Chloride: 102 meq/L (ref 96–112)
Creatinine, Ser: 0.72 mg/dL (ref 0.40–1.20)
GFR: 82.42 mL/min
Glucose, Bld: 85 mg/dL (ref 70–99)
Potassium: 3.8 meq/L (ref 3.5–5.1)
Sodium: 140 meq/L (ref 135–145)
Total Bilirubin: 0.6 mg/dL (ref 0.2–1.2)
Total Protein: 7.1 g/dL (ref 6.0–8.3)

## 2024-05-03 LAB — LIPID PANEL
Cholesterol: 154 mg/dL (ref 28–200)
HDL: 44.3 mg/dL
LDL Cholesterol: 96 mg/dL (ref 10–99)
NonHDL: 109.64
Total CHOL/HDL Ratio: 3
Triglycerides: 68 mg/dL (ref 10.0–149.0)
VLDL: 13.6 mg/dL (ref 0.0–40.0)

## 2024-05-03 LAB — VITAMIN D 25 HYDROXY (VIT D DEFICIENCY, FRACTURES): VITD: 48.84 ng/mL (ref 30.00–100.00)

## 2024-05-03 LAB — HEMOGLOBIN A1C: Hgb A1c MFr Bld: 5.5 % (ref 4.6–6.5)

## 2024-05-03 LAB — VITAMIN B12: Vitamin B-12: 1395 pg/mL — ABNORMAL HIGH (ref 211–911)

## 2024-05-03 NOTE — Assessment & Plan Note (Addendum)
 B12 in the normal range in past  on supplementation  Due for re-eval.

## 2024-05-03 NOTE — Assessment & Plan Note (Addendum)
"   Chronic, well controlled  in past  Due for re-eval.  Atorvastatin  10 mg daily "

## 2024-05-03 NOTE — Assessment & Plan Note (Signed)
"   Dad with Alzheimer's around age 75.  Will eval with labs.  NML TSH and cbc with GLP1 med receipt. "

## 2024-05-03 NOTE — Assessment & Plan Note (Signed)
"   Chronic Recommend weight bearing exercise, calcium  in diet and vit D supplement 400 IU 1-2 times daily. Repeat in 2027 "

## 2024-05-03 NOTE — Assessment & Plan Note (Signed)
Chronic, stable control on Prozac 10 mg p.o. daily.

## 2024-05-03 NOTE — Assessment & Plan Note (Signed)
Stable using albuterol prn.

## 2024-05-03 NOTE — Assessment & Plan Note (Signed)
"   Has been off CPAP plans to restart. "

## 2024-05-03 NOTE — Progress Notes (Signed)
 "   Patient ID: Gina Harper, female    DOB: [birth year], 74 y.o.   MRN: [id number]  This visit was conducted in person.  BP 122/80   Pulse 68   Temp 98 F (36.7 C) (Oral)   Ht 5' 1.75 (1.568 m)   Wt 155 lb 8 oz (70.5 kg)   SpO2 97%   BMI 28.67 kg/m    CC:  Chief Complaint  Patient presents with   Annual Exam    Subjective:   HPI: Gina Harper is a 74 y.o. female presenting on 05/03/2024 for Annual Exam  The patient presents for  medicare wellness,  complete physical and review of chronic health problems. He/She also has the following acute concerns today:  none  I have personally reviewed the Medicare Annual Wellness questionnaire and have noted 1. The patient's medical and social history 2. Their use of alcohol, tobacco or illicit drugs 3. Their current medications and supplements 4. The patient's functional ability including ADL's, fall risks, home safety risks and hearing or visual             impairment. 5. Diet and physical activities 6. Evidence for depression or mood disorders 7.         Updated provider list Cognitive evaluation was performed and recorded on pt medicare questionnaire form. The patients weight, height, BMI and visual acuity have been recorded in the chart   I have made referrals, counseling and provided education to the patient based review of the above and I have provided the pt with a written personalized care plan for preventive services.   Documentation of this information was scanned into the electronic record under the media tab.   Advance directives and end of life planning reviewed in detail with patient and documented in EMR. Patient given handout on advance care directives if needed. HCPOA and living will updated if needed.  No results found.   No falls in last 12 months.    Pain in left breast.. nml mammogram in 12/2021.  No lumps noted.  The patient saw a LPN or RN for medicare wellness visit.  Prevention and wellness was  reviewed in detail. Note reviewed and important notes copied below as needed.   Hypertension:  Well-controlled usually on HCTZ 25 mg daily  BP Readings from Last 3 Encounters:  05/03/24 122/80  02/25/24 122/82  02/07/24 124/72  Using medication without problems or lightheadedness: none Chest pain with exertion:none Edema:none Short of breath:none Average home BPs: not checking Other issues:  Prediabetes :   Lab Results  Component Value Date   HGBA1C 5.6 04/29/2023    Elevated Cholesterol:  well controlled  on atorvastatin   10 mg daily Lab Results  Component Value Date   CHOL 127 04/29/2023   HDL 47.40 04/29/2023   LDLCALC 60 04/29/2023   TRIG 99.0 04/29/2023   CHOLHDL 3 04/29/2023  Using medications without problems:none Muscle aches: none Diet compliance: weight watchers Exercise: 1-2 times a week. Other complaints:  MDD Prozac  10 mg p.o. daily Flowsheet Row Office Visit from 05/03/2024 in [facility]  PHQ-2 Total Score 0   B12 def:  Due   She has lost weight on tirzepatide compounded .. started on 03/20/2024  She has lost 12 lbs.    Recent TSH nml.  Relevant past medical, surgical, family and social history reviewed and updated as indicated. Interim medical history since our last visit reviewed. Allergies and medications reviewed and updated. Outpatient  Medications Prior to Visit  Medication Sig Dispense Refill   albuterol  (VENTOLIN  HFA) 108 (90 Base) MCG/ACT inhaler Inhale 2 puffs into the lungs every 6 (six) hours as needed for wheezing or shortness of breath. 8 g 2   benzonatate  (TESSALON ) 200 MG capsule Take 1 capsule (200 mg total) by mouth 3 (three) times daily as needed for cough. Swallow whole 30 capsule 1   Biotin 10000 MCG TABS Take 1 tablet by mouth daily.     cetirizine (ZYRTEC) 10 MG tablet Take 10 mg by mouth as needed.      Cholecalciferol (VITAMIN D3) 2000 UNITS TABS Take by mouth daily.      FLUoxetine   (PROZAC ) 10 MG capsule Take 10 mg by mouth daily.     fluticasone -salmeterol (WIXELA INHUB) 250-50 MCG/ACT AEPB Inhale 1 puff into the lungs in the morning and at bedtime.     hydrochlorothiazide  (HYDRODIURIL ) 25 MG tablet TAKE 1 TABLET BY MOUTH DAILY 100 tablet 0   Multiple Vitamin (MULTI-VITAMINS) TABS Take by mouth daily.     Potassium Gluconate 550 MG TABS Take by mouth daily.      promethazine  (PHENERGAN ) 25 MG suppository Place 1 suppository (25 mg total) rectally every 6 (six) hours as needed. 12 each 1   vitamin B-12 (CYANOCOBALAMIN ) 1000 MCG tablet Take 1,000 mcg by mouth daily.     ALPHA LIPOIC ACID PO Take 1,200 mg by mouth daily. (Patient not taking: Reported on 05/03/2024)     doxycycline  (VIBRA -TABS) 100 MG tablet Take 1 tablet (100 mg total) by mouth 2 (two) times daily. (Patient not taking: Reported on 05/03/2024) 20 tablet 0   HYDROcodone  bit-homatropine (HYCODAN) 5-1.5 MG/5ML syrup Take 5 mLs by mouth at bedtime as needed for cough. (Patient not taking: Reported on 05/03/2024) 120 mL 0   No facility-administered medications prior to visit.     Per HPI unless specifically indicated in ROS section below Review of Systems  Constitutional:  Negative for fatigue and fever.  HENT:  Negative for congestion.   Eyes:  Negative for pain.  Respiratory:  Negative for cough and shortness of breath.        Snoring  Cardiovascular:  Negative for chest pain, palpitations and leg swelling.  Gastrointestinal:  Negative for abdominal pain.  Genitourinary:  Negative for dysuria and vaginal bleeding.  Musculoskeletal:  Negative for back pain.  Skin:  Negative for rash.  Neurological:  Negative for syncope, light-headedness and headaches.  Psychiatric/Behavioral:  Negative for dysphoric mood.    Objective:  BP 122/80   Pulse 68   Temp 98 F (36.7 C) (Oral)   Ht 5' 1.75 (1.568 m)   Wt 155 lb 8 oz (70.5 kg)   SpO2 97%   BMI 28.67 kg/m   Wt Readings from Last 3 Encounters:  05/03/24  155 lb 8 oz (70.5 kg)  02/25/24 167 lb 9.6 oz (76 kg)  02/07/24 169 lb 2 oz (76.7 kg)      Physical Exam Vitals and nursing note reviewed.  Constitutional:      General: She is not in acute distress.    Appearance: Normal appearance. She is well-developed. She is not ill-appearing or toxic-appearing.  HENT:     Head: Normocephalic.     Right Ear: Hearing, tympanic membrane, ear canal and external ear normal.     Left Ear: Hearing, tympanic membrane, ear canal and external ear normal.     Nose: Nose normal.  Eyes:     General: Lids are  normal. Lids are everted, no foreign bodies appreciated.     Conjunctiva/sclera: Conjunctivae normal.     Pupils: Pupils are equal, round, and reactive to light.  Neck:     Thyroid : No thyroid  mass or thyromegaly.     Vascular: No carotid bruit.     Trachea: Trachea normal.  Cardiovascular:     Rate and Rhythm: Normal rate and regular rhythm.     Heart sounds: Normal heart sounds, S1 normal and S2 normal. No murmur heard.    No gallop.  Pulmonary:     Effort: Pulmonary effort is normal. No respiratory distress.     Breath sounds: Normal breath sounds. No wheezing, rhonchi or rales.  Chest:  Breasts:    Breasts are symmetrical.     Right: Normal. No inverted nipple, mass or nipple discharge.     Left: Normal. No inverted nipple, mass or nipple discharge.     Comments: No current tenderness on exam. Abdominal:     General: Bowel sounds are normal. There is no distension or abdominal bruit.     Palpations: Abdomen is soft. There is no fluid wave or mass.     Tenderness: There is no abdominal tenderness. There is no guarding or rebound.     Hernia: No hernia is present.  Musculoskeletal:     Cervical back: Normal range of motion and neck supple.  Lymphadenopathy:     Cervical: No cervical adenopathy.  Skin:    General: Skin is warm and dry.     Findings: No rash.  Neurological:     Mental Status: She is alert.     Cranial Nerves: No cranial  nerve deficit.     Sensory: No sensory deficit.  Psychiatric:        Mood and Affect: Mood is not anxious or depressed.        Speech: Speech normal.        Behavior: Behavior normal. Behavior is cooperative.        Judgment: Judgment normal.       Results for orders placed or performed in visit on 02/07/24  POCT rapid strep A   Collection Time: 02/07/24  2:42 PM  Result Value Ref Range   Rapid Strep A Screen Negative Negative  POC COVID-19 BinaxNow   Collection Time: 02/07/24  2:42 PM  Result Value Ref Range   SARS Coronavirus 2 Ag Negative Negative     COVID 19 screen:  No recent travel or known exposure to COVID19 The patient denies respiratory symptoms of COVID 19 at this time. The importance of social distancing was discussed today.   Assessment and Plan The patient's preventative maintenance and recommended screening tests for an annual wellness exam were reviewed in full today. Brought up to date unless services declined.  Counselled on the importance of diet, exercise, and its role in overall health and mortality. The patient's FH and SH was reviewed, including their home life, tobacco status, and drug and alcohol status.    PAP not indicated given partial hysterectomy, no family history of ovarian cancer. Mammogram 12/2023 nml  Colonoscopy 09/2020 Dr. Aundria,  no further indicated.  Uptodate flu vaccine, pneumonia uptodate, COVID vaccine x 3,   Consider shingrix vaccine, due for Td  Hep c neg  DEXA: 2017  Osteopenia,  improved  osteopenia on 12/19/2020 .SABRA  Repeat in 2027  Problem List Items Addressed This Visit     B12 deficiency (Chronic)   B12 in the normal range in past  on supplementation  Due for re-eval.      Relevant Orders   Vitamin B12   Essential hypertension, benign (Chronic)   Stable, chronic.  Continue current medication.     HCTZ 25 mg daily      MDD (major depressive disorder), single episode, moderate (HCC)   Chronic, stable control on  Prozac  10 mg p.o. daily.      Memory loss    Dad with Alzheimer's around age 3.  Will eval with labs.  NML TSH and cbc with GLP1 med receipt.      Relevant Orders   VITAMIN D  25 Hydroxy (Vit-D Deficiency, Fractures)   Mild persistent asthma    Stable using albuterol  prn.      Osteopenia (Chronic)    Chronic Recommend weight bearing exercise, calcium  in diet and vit D supplement 400 IU 1-2 times daily. Repeat in 2027      Prediabetes (Chronic)   Chronic, stable control with diet I past  Due for re-eval.      Relevant Orders   Hemoglobin A1c   Pure hypercholesterolemia (Chronic)    Chronic, well controlled  in past  Due for re-eval.  Atorvastatin  10 mg daily      Relevant Orders   Lipid panel   Comprehensive metabolic panel with GFR   Severe obstructive sleep apnea    Has been off CPAP plans to restart.      Thyroid  nodule   Other Visit Diagnoses       Medicare annual wellness visit, subsequent    -  Primary         Greig Ring, MD   "

## 2024-05-03 NOTE — Assessment & Plan Note (Signed)
Stable, chronic.  Continue current medication.  HCTZ 25 mg daily 

## 2024-05-03 NOTE — Assessment & Plan Note (Addendum)
 Chronic, stable control with diet I past  Due for re-eval.

## 2024-05-04 ENCOUNTER — Ambulatory Visit: Payer: Self-pay | Admitting: Family Medicine

## 2024-05-10 ENCOUNTER — Other Ambulatory Visit: Payer: Self-pay | Admitting: Family Medicine

## 2024-08-29 ENCOUNTER — Ambulatory Visit: Admitting: Pulmonary Disease

## 2025-04-26 ENCOUNTER — Other Ambulatory Visit

## 2025-05-04 ENCOUNTER — Encounter: Admitting: Family Medicine
